# Patient Record
Sex: Female | Born: 1950 | Race: White | Hispanic: No | Marital: Married | Smoking: Former smoker
Health system: Southern US, Community
[De-identification: ages and names within clinical notes are randomized; demographics above are authoritative.]

## PROBLEM LIST (undated history)

## (undated) DIAGNOSIS — M810 Age-related osteoporosis without current pathological fracture: Secondary | ICD-10-CM

## (undated) DIAGNOSIS — G8929 Other chronic pain: Secondary | ICD-10-CM

## (undated) DIAGNOSIS — M1990 Unspecified osteoarthritis, unspecified site: Secondary | ICD-10-CM

## (undated) DIAGNOSIS — E039 Hypothyroidism, unspecified: Secondary | ICD-10-CM

## (undated) DIAGNOSIS — J439 Emphysema, unspecified: Secondary | ICD-10-CM

## (undated) DIAGNOSIS — M4800 Spinal stenosis, site unspecified: Secondary | ICD-10-CM

## (undated) DIAGNOSIS — K219 Gastro-esophageal reflux disease without esophagitis: Secondary | ICD-10-CM

## (undated) DIAGNOSIS — I700 Atherosclerosis of aorta: Secondary | ICD-10-CM

## (undated) DIAGNOSIS — K635 Polyp of colon: Secondary | ICD-10-CM

## (undated) DIAGNOSIS — I38 Endocarditis, valve unspecified: Secondary | ICD-10-CM

## (undated) DIAGNOSIS — E559 Vitamin D deficiency, unspecified: Secondary | ICD-10-CM

## (undated) DIAGNOSIS — I881 Chronic lymphadenitis, except mesenteric: Secondary | ICD-10-CM

## (undated) DIAGNOSIS — I6529 Occlusion and stenosis of unspecified carotid artery: Secondary | ICD-10-CM

## (undated) DIAGNOSIS — M961 Postlaminectomy syndrome, not elsewhere classified: Secondary | ICD-10-CM

## (undated) DIAGNOSIS — I10 Essential (primary) hypertension: Secondary | ICD-10-CM

## (undated) DIAGNOSIS — C4490 Unspecified malignant neoplasm of skin, unspecified: Secondary | ICD-10-CM

## (undated) DIAGNOSIS — I2510 Atherosclerotic heart disease of native coronary artery without angina pectoris: Secondary | ICD-10-CM

## (undated) DIAGNOSIS — E785 Hyperlipidemia, unspecified: Secondary | ICD-10-CM

## (undated) DIAGNOSIS — Z860101 Personal history of adenomatous and serrated colon polyps: Secondary | ICD-10-CM

## (undated) DIAGNOSIS — I77810 Thoracic aortic ectasia: Secondary | ICD-10-CM

## (undated) DIAGNOSIS — J302 Other seasonal allergic rhinitis: Secondary | ICD-10-CM

## (undated) DIAGNOSIS — R7303 Prediabetes: Secondary | ICD-10-CM

## (undated) DIAGNOSIS — E079 Disorder of thyroid, unspecified: Secondary | ICD-10-CM

## (undated) DIAGNOSIS — K760 Fatty (change of) liver, not elsewhere classified: Secondary | ICD-10-CM

## (undated) DIAGNOSIS — G4730 Sleep apnea, unspecified: Secondary | ICD-10-CM

## (undated) DIAGNOSIS — J449 Chronic obstructive pulmonary disease, unspecified: Secondary | ICD-10-CM

## (undated) HISTORY — PX: ABDOMINAL HYSTERECTOMY: SHX81

## (undated) HISTORY — PX: UPPER GI ENDOSCOPY: SHX6162

## (undated) HISTORY — PX: EYE SURGERY: SHX253

## (undated) HISTORY — PX: TONSILLECTOMY: SUR1361

## (undated) HISTORY — PX: COLONOSCOPY: SHX174

## (undated) HISTORY — PX: BACK SURGERY: SHX140

---
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
## 1972-12-05 HISTORY — PX: TUBAL LIGATION: SHX77

## 1972-12-05 HISTORY — PX: APPENDECTOMY: SHX54

## 1983-12-06 HISTORY — PX: BREAST EXCISIONAL BIOPSY: SUR124

## 1991-12-06 HISTORY — PX: ABDOMINAL HYSTERECTOMY: SHX81

## 2004-06-20 ENCOUNTER — Other Ambulatory Visit: Payer: Self-pay

## 2004-10-13 ENCOUNTER — Ambulatory Visit: Payer: Self-pay | Admitting: Family Medicine

## 2008-12-05 HISTORY — PX: LUMBAR FUSION: SHX111

## 2011-12-06 HISTORY — PX: LUMBAR FUSION: SHX111

## 2012-04-24 DIAGNOSIS — M961 Postlaminectomy syndrome, not elsewhere classified: Secondary | ICD-10-CM | POA: Insufficient documentation

## 2015-11-18 ENCOUNTER — Other Ambulatory Visit: Payer: Self-pay | Admitting: Orthopedic Surgery

## 2015-11-18 DIAGNOSIS — M79642 Pain in left hand: Secondary | ICD-10-CM

## 2015-11-18 DIAGNOSIS — S638X2D Sprain of other part of left wrist and hand, subsequent encounter: Secondary | ICD-10-CM

## 2015-12-10 ENCOUNTER — Ambulatory Visit
Admission: RE | Admit: 2015-12-10 | Discharge: 2015-12-10 | Disposition: A | Discharge status: Home / Self Care | Payer: BLUE CROSS/BLUE SHIELD | Source: Ambulatory Visit | Attending: Orthopedic Surgery | Admitting: Orthopedic Surgery

## 2015-12-10 DIAGNOSIS — M19042 Primary osteoarthritis, left hand: Secondary | ICD-10-CM | POA: Diagnosis not present

## 2015-12-10 DIAGNOSIS — M79642 Pain in left hand: Secondary | ICD-10-CM | POA: Insufficient documentation

## 2015-12-10 DIAGNOSIS — M659 Synovitis and tenosynovitis, unspecified: Secondary | ICD-10-CM | POA: Insufficient documentation

## 2015-12-10 DIAGNOSIS — S638X2D Sprain of other part of left wrist and hand, subsequent encounter: Secondary | ICD-10-CM

## 2016-06-17 DIAGNOSIS — C4490 Unspecified malignant neoplasm of skin, unspecified: Secondary | ICD-10-CM | POA: Insufficient documentation

## 2018-03-07 ENCOUNTER — Other Ambulatory Visit: Payer: Self-pay | Admitting: Family Medicine

## 2018-03-07 DIAGNOSIS — Z1231 Encounter for screening mammogram for malignant neoplasm of breast: Secondary | ICD-10-CM

## 2018-03-08 ENCOUNTER — Other Ambulatory Visit: Payer: Self-pay | Admitting: Family Medicine

## 2018-03-08 DIAGNOSIS — M7989 Other specified soft tissue disorders: Secondary | ICD-10-CM

## 2018-03-13 ENCOUNTER — Other Ambulatory Visit: Payer: Self-pay | Admitting: Family Medicine

## 2018-03-13 DIAGNOSIS — M7989 Other specified soft tissue disorders: Secondary | ICD-10-CM

## 2018-03-16 ENCOUNTER — Other Ambulatory Visit: Payer: Self-pay | Admitting: *Deleted

## 2018-03-16 ENCOUNTER — Inpatient Hospital Stay
Admission: RE | Admit: 2018-03-16 | Discharge: 2018-03-16 | Disposition: A | Discharge status: Home / Self Care | Payer: Self-pay | Source: Ambulatory Visit | Attending: *Deleted | Admitting: *Deleted

## 2018-03-16 DIAGNOSIS — Z9289 Personal history of other medical treatment: Secondary | ICD-10-CM

## 2018-03-28 ENCOUNTER — Ambulatory Visit
Admission: RE | Admit: 2018-03-28 | Discharge: 2018-03-28 | Disposition: A | Discharge status: Home / Self Care | Payer: Medicare Other | Source: Ambulatory Visit | Attending: Family Medicine | Admitting: Family Medicine

## 2018-03-28 DIAGNOSIS — R928 Other abnormal and inconclusive findings on diagnostic imaging of breast: Secondary | ICD-10-CM | POA: Insufficient documentation

## 2018-03-28 DIAGNOSIS — M7989 Other specified soft tissue disorders: Secondary | ICD-10-CM

## 2018-04-03 ENCOUNTER — Other Ambulatory Visit: Payer: Self-pay

## 2018-04-03 ENCOUNTER — Emergency Department
Admission: EM | Admit: 2018-04-03 | Discharge: 2018-04-03 | Disposition: A | Discharge status: Home / Self Care | Payer: Medicare Other | Attending: Emergency Medicine | Admitting: Emergency Medicine

## 2018-04-03 ENCOUNTER — Encounter: Payer: Self-pay | Admitting: Emergency Medicine

## 2018-04-03 DIAGNOSIS — S6992XA Unspecified injury of left wrist, hand and finger(s), initial encounter: Secondary | ICD-10-CM | POA: Diagnosis present

## 2018-04-03 DIAGNOSIS — Z79899 Other long term (current) drug therapy: Secondary | ICD-10-CM | POA: Diagnosis not present

## 2018-04-03 DIAGNOSIS — I10 Essential (primary) hypertension: Secondary | ICD-10-CM | POA: Diagnosis not present

## 2018-04-03 DIAGNOSIS — Z91040 Latex allergy status: Secondary | ICD-10-CM | POA: Diagnosis not present

## 2018-04-03 DIAGNOSIS — Y999 Unspecified external cause status: Secondary | ICD-10-CM | POA: Diagnosis not present

## 2018-04-03 DIAGNOSIS — S61012A Laceration without foreign body of left thumb without damage to nail, initial encounter: Secondary | ICD-10-CM | POA: Insufficient documentation

## 2018-04-03 DIAGNOSIS — W260XXA Contact with knife, initial encounter: Secondary | ICD-10-CM | POA: Diagnosis not present

## 2018-04-03 DIAGNOSIS — F17200 Nicotine dependence, unspecified, uncomplicated: Secondary | ICD-10-CM | POA: Diagnosis not present

## 2018-04-03 DIAGNOSIS — Y929 Unspecified place or not applicable: Secondary | ICD-10-CM | POA: Insufficient documentation

## 2018-04-03 DIAGNOSIS — Y93G1 Activity, food preparation and clean up: Secondary | ICD-10-CM | POA: Diagnosis not present

## 2018-04-03 HISTORY — DX: Disorder of thyroid, unspecified: E07.9

## 2018-04-03 HISTORY — DX: Essential (primary) hypertension: I10

## 2018-04-03 HISTORY — DX: Unspecified osteoarthritis, unspecified site: M19.90

## 2018-04-03 MED ORDER — BACITRACIN ZINC 500 UNIT/GM EX OINT
TOPICAL_OINTMENT | Freq: Once | CUTANEOUS | Status: AC
  Administered 2018-04-03: 1 via TOPICAL
  Filled 2018-04-03: qty 0.9

## 2018-04-03 MED ORDER — LIDOCAINE HCL (PF) 1 % IJ SOLN
5.0000 mL | Freq: Once | INTRAMUSCULAR | Status: DC
  Filled 2018-04-03: qty 5

## 2018-04-03 NOTE — ED Triage Notes (Signed)
Pt to ED via POV. Pt states that she was slicing ham and accidentally cut her thumb. Pt is in NAD at this time. Bleeding is currently controlled.

## 2018-04-03 NOTE — ED Notes (Signed)
See triage note   States she was slice meat   The knife slipped   Laceration noted to left thumb area

## 2018-04-03 NOTE — Discharge Instructions (Addendum)
Follow-up with your regular doctor in 7 to 10 days for suture removal.  Return to the emergency department if there is any sign of infection which would include pus, swelling, redness or increased pain.  Keep the area clean and dry.  Wash daily with soap and water.  Return to the emergency department for any issues with sutures.

## 2018-04-03 NOTE — ED Provider Notes (Signed)
[facility] Emergency Department Provider Note  ____________________________________________   First MD Initiated Contact with Patient 04/03/18 1207     (approximate)  I have reviewed the triage vital signs and the nursing notes.   HISTORY  Chief Complaint Laceration    HPI Carol Carey is a 67 y.o. female presents emergency department with a laceration to the left thumb.  She states the night slipped while she was cutting ham.  She states her tetanus is may be 4 to 5 years.  No greater than 5.  Patient denies any other injuries.  Past Medical History:  Diagnosis Date  . Arthritis   . Hypertension   . Thyroid disease     There are no active problems to display for this patient.   Past Surgical History:  Procedure Laterality Date  . ABDOMINAL HYSTERECTOMY    . BREAST EXCISIONAL BIOPSY Right 1985   Benign cycts removed    Prior to Admission medications   Medication Sig Start Date End Date Taking? Authorizing Provider  atenolol (TENORMIN) 50 MG tablet Take 50 mg by mouth daily.   Yes [provider]  buPROPion (WELLBUTRIN SR) 150 MG 12 hr tablet Take 150 mg by mouth 2 (two) times daily.   Yes [provider]  denosumab (PROLIA) 60 MG/ML SOSY injection Inject 60 mg into the skin every 6 (six) months.   Yes [provider]  esomeprazole (NEXIUM) 20 MG capsule Take 20 mg by mouth daily at 12 noon.   Yes [provider]  losartan-hydrochlorothiazide (HYZAAR) 100-25 MG tablet Take 1 tablet by mouth daily.   Yes [provider]  thyroid (ARMOUR) 60 MG tablet Take 60 mg by mouth daily before breakfast.   Yes [provider]    Allergies Demerol [meperidine hcl]; Latex; Penicillins; and Sulfa antibiotics  Family History  Problem Relation Age of Onset  . Breast cancer Neg Hx     Social History Social History   Tobacco Use  . Smoking status: Current Every Day Smoker  . Smokeless tobacco:  Never Used  Substance Use Topics  . Alcohol use: Yes  . Drug use: Not Currently    Review of Systems  Constitutional: No fever/chills Eyes: No visual changes. ENT: No sore throat. Respiratory: Denies cough Genitourinary: Negative for dysuria. Musculoskeletal: Negative for back pain.  Positive laceration to the left thumb Skin: Negative for rash.    ____________________________________________   PHYSICAL EXAM:  VITAL SIGNS: ED Triage Vitals  Enc Vitals Group     BP 04/03/18 1154 (S) (!) 190/87     Pulse Rate 04/03/18 1154 72     Resp 04/03/18 1154 16     Temp 04/03/18 1154 98.5 F (36.9 C)     Temp Source 04/03/18 1154 Oral     SpO2 04/03/18 1154 96 %     Weight 04/03/18 1156 160 lb (72.6 kg)     Height 04/03/18 1156 5' (1.524 m)     Head Circumference --      Peak Flow --      Pain Score 04/03/18 1156 5     Pain Loc --      Pain Edu? --      Excl. in [place]? --     Constitutional: Alert and oriented. Well appearing and in no acute distress. Eyes: Conjunctivae are normal.  Head: Atraumatic. Nose: No congestion/rhinnorhea. Mouth/Throat: Mucous membranes are moist.   Cardiovascular: Normal rate, regular rhythm. Respiratory: Normal respiratory effort.  No  retractions GU: deferred Musculoskeletal: FROM all extremities, warm and well perfused.  Positive for a blister patient to the dorsum of the left thumb.  No foreign body is noted.  The patient has full range of motion Neurologic:  Normal speech and language.  Skin:  Skin is warm, dry , positive laceration to the left thumb Psychiatric: Mood and affect are normal. Speech and behavior are normal.  ____________________________________________   LABS (all labs ordered are listed, but only abnormal results are displayed)  Labs Reviewed - No data to  display ____________________________________________   ____________________________________________  RADIOLOGY    ____________________________________________   PROCEDURES  Procedure(s) performed:   Marland KitchenMarland KitchenLaceration Repair Date/Time: 04/03/2018 12:42 PM Performed by: Versie Starks, PA-C Authorized by: Versie Starks, PA-C   Consent:    Consent obtained:  Verbal   Consent given by:  Patient   Risks discussed:  Infection, pain, poor cosmetic result, poor wound healing and tendon damage   Alternatives discussed:  No treatment Anesthesia (see MAR for exact dosages):    Anesthesia method:  Nerve block   Block location:  Left thumb   Block needle gauge:  25 G   Block anesthetic:  Lidocaine 1% w/o epi   Block injection procedure:  Anatomic landmarks identified, introduced needle, incremental injection, anatomic landmarks palpated and negative aspiration for blood   Block outcome:  Anesthesia achieved Laceration details:    Location:  Finger   Finger location:  L thumb   Length (cm):  1.5   Depth (mm):  2 Repair type:    Repair type:  Simple Pre-procedure details:    Preparation:  Patient was prepped and draped in usual sterile fashion Exploration:    Hemostasis achieved with:  Direct pressure   Wound exploration: wound explored through full range of motion     Contaminated: no   Treatment:    Area cleansed with:  Betadine and saline   Amount of cleaning:  Standard   Irrigation solution:  Sterile saline   Irrigation method:  Syringe and tap   Visualized foreign bodies/material removed: no   Skin repair:    Repair method:  Sutures   Suture size:  5-0   Suture material:  Nylon   Suture technique:  Simple interrupted   Number of sutures:  5 Approximation:    Approximation:  Close Post-procedure details:    Dressing:  Antibiotic ointment and non-adherent dressing   Patient tolerance of procedure:  Tolerated well, no immediate complications Comments:     Sutures done  by [facility] student with my supervision      ____________________________________________   INITIAL IMPRESSION / ASSESSMENT AND PLAN / ED COURSE  Pertinent labs & imaging results that were available during my care of the patient were reviewed by me and considered in my medical decision making (see chart for details).  Patient is a [age]-year-old female presents emergency department with laceration to the left thumb.  On physical exam the left thumb has a 1 and half centimeter laceration dorsally.  The laceration was repaired with 5-0 Ethilon.  5 simple sutures were inserted by the student.  The patient tolerated procedure well.  Sutures are well aligned and intact.  The patient was instructed to follow-up with her regular doctor in 7 to 10 days for suture removal.  She is to clean the area with soap and water daily.  She is to return the emergency department for any signs of infection.  She states she understands and was discharged in  stable condition.     As part of my medical decision making, I reviewed the following data within the [place] notes reviewed and incorporated, Notes from prior ED visits and [place] Controlled Substance Database  ____________________________________________   FINAL CLINICAL IMPRESSION(S) / ED DIAGNOSES  Final diagnoses:  Laceration of left thumb without foreign body without damage to nail, initial encounter      NEW MEDICATIONS STARTED DURING THIS VISIT:  Discharge Medication List as of 04/03/2018  1:08 PM       Note:  This document was prepared using Dragon voice recognition software and may include unintentional dictation errors.    Versie Starks, PA-C 04/03/18 [place], [place], MD 04/04/18 0900

## 2018-04-03 NOTE — ED Notes (Signed)
First Nurse Note: Pt was slicing meat and knife slipped cutting her left thumb. Bleeding is under control at this time. States that tetanus has been done 4 years ago.

## 2018-04-11 ENCOUNTER — Telehealth: Payer: Self-pay | Admitting: *Deleted

## 2018-04-11 DIAGNOSIS — Z122 Encounter for screening for malignant neoplasm of respiratory organs: Secondary | ICD-10-CM

## 2018-04-11 DIAGNOSIS — Z87891 Personal history of nicotine dependence: Secondary | ICD-10-CM

## 2018-04-11 NOTE — Telephone Encounter (Signed)
Received referral for initial lung cancer screening scan. Contacted patient and obtained smoking history,(current, 47 pack year) as well as answering questions related to screening process. Patient denies signs of lung cancer such as weight loss or hemoptysis. Patient denies comorbidity that would prevent curative treatment if lung cancer were found. Patient is scheduled for shared decision making visit and CT scan on 04/24/18.

## 2018-04-24 ENCOUNTER — Ambulatory Visit
Admission: RE | Admit: 2018-04-24 | Discharge: 2018-04-24 | Disposition: A | Discharge status: Home / Self Care | Payer: Medicare Other | Source: Ambulatory Visit | Attending: Oncology | Admitting: Oncology

## 2018-04-24 ENCOUNTER — Encounter: Payer: Self-pay | Admitting: Oncology

## 2018-04-24 ENCOUNTER — Inpatient Hospital Stay: Payer: Medicare Other | Attending: Oncology | Admitting: Oncology

## 2018-04-24 DIAGNOSIS — Z87891 Personal history of nicotine dependence: Secondary | ICD-10-CM

## 2018-04-24 DIAGNOSIS — I700 Atherosclerosis of aorta: Secondary | ICD-10-CM | POA: Insufficient documentation

## 2018-04-24 DIAGNOSIS — J439 Emphysema, unspecified: Secondary | ICD-10-CM | POA: Diagnosis not present

## 2018-04-24 DIAGNOSIS — F17210 Nicotine dependence, cigarettes, uncomplicated: Secondary | ICD-10-CM | POA: Diagnosis not present

## 2018-04-24 DIAGNOSIS — Z122 Encounter for screening for malignant neoplasm of respiratory organs: Secondary | ICD-10-CM | POA: Diagnosis not present

## 2018-04-24 DIAGNOSIS — I2510 Atherosclerotic heart disease of native coronary artery without angina pectoris: Secondary | ICD-10-CM | POA: Diagnosis not present

## 2018-04-24 NOTE — Progress Notes (Signed)
In accordance with CMS guidelines, patient has met eligibility criteria including age, absence of signs or symptoms of lung cancer.  Social History   Tobacco Use  . Smoking status: Current Every Day Smoker    Packs/day: 1.00    Years: 47.00    Pack years: 47.00  . Smokeless tobacco: Never Used  Substance Use Topics  . Alcohol use: Yes  . Drug use: Not Currently     A shared decision-making session was conducted prior to the performance of CT scan. This includes one or more decision aids, includes benefits and harms of screening, follow-up diagnostic testing, over-diagnosis, false positive rate, and total radiation exposure.  Counseling on the importance of adherence to annual lung cancer LDCT screening, impact of co-morbidities, and ability or willingness to undergo diagnosis and treatment is imperative for compliance of the program.  Counseling on the importance of continued smoking cessation for former smokers; the importance of smoking cessation for current smokers, and information about tobacco cessation interventions have been given to patient including [place] and 1800 quit [place] programs.  Written order for lung cancer screening with LDCT has been given to the patient and any and all questions have been answered to the best of my abilities.   Yearly follow up will be coordinated by Burgess Estelle, Thoracic Navigator.  Faythe Casa, NP 04/24/2018 1:15 PM

## 2018-04-27 ENCOUNTER — Telehealth: Payer: Self-pay | Admitting: *Deleted

## 2018-04-27 NOTE — Telephone Encounter (Signed)
Notified patient of LDCT lung cancer screening program results with recommendation for 12 month follow up imaging. Also notified of incidental findings noted below and is encouraged to discuss further with PCP who will receive a copy of this note and/or the CT report. Patient verbalizes understanding.   IMPRESSION: 1. Lung-RADS 1, negative. Continue annual screening with low-dose chest CT without contrast in 12 months. 2. Aortic atherosclerosis (ICD10-I70.0), coronary artery atherosclerosis and emphysema (ICD10-J43.9). 3. Borderline enlarged AP window node is likely reactive. Recommend attention on follow-up.

## 2018-05-02 DIAGNOSIS — Z86010 Personal history of colonic polyps: Secondary | ICD-10-CM | POA: Insufficient documentation

## 2018-05-04 DIAGNOSIS — I708 Atherosclerosis of other arteries: Secondary | ICD-10-CM | POA: Insufficient documentation

## 2018-05-04 DIAGNOSIS — I7090 Unspecified atherosclerosis: Secondary | ICD-10-CM | POA: Insufficient documentation

## 2018-05-04 DIAGNOSIS — J439 Emphysema, unspecified: Secondary | ICD-10-CM | POA: Insufficient documentation

## 2019-04-23 ENCOUNTER — Other Ambulatory Visit: Payer: Self-pay | Admitting: Family Medicine

## 2019-04-23 DIAGNOSIS — R590 Localized enlarged lymph nodes: Secondary | ICD-10-CM

## 2019-05-01 ENCOUNTER — Other Ambulatory Visit: Payer: Self-pay

## 2019-05-01 ENCOUNTER — Ambulatory Visit
Admission: RE | Admit: 2019-05-01 | Discharge: 2019-05-01 | Disposition: A | Discharge status: Home / Self Care | Payer: Medicare Other | Source: Ambulatory Visit | Attending: Family Medicine | Admitting: Family Medicine

## 2019-05-01 DIAGNOSIS — R590 Localized enlarged lymph nodes: Secondary | ICD-10-CM | POA: Diagnosis not present

## 2019-05-09 ENCOUNTER — Other Ambulatory Visit (HOSPITAL_COMMUNITY): Payer: Self-pay | Admitting: Otolaryngology

## 2019-05-09 ENCOUNTER — Other Ambulatory Visit: Payer: Self-pay | Admitting: Otolaryngology

## 2019-05-09 DIAGNOSIS — I881 Chronic lymphadenitis, except mesenteric: Secondary | ICD-10-CM

## 2019-05-15 ENCOUNTER — Other Ambulatory Visit
Admission: RE | Admit: 2019-05-15 | Discharge: 2019-05-15 | Disposition: A | Discharge status: Home / Self Care | Payer: Medicare Other | Source: Ambulatory Visit | Attending: Otolaryngology | Admitting: Otolaryngology

## 2019-05-15 ENCOUNTER — Ambulatory Visit
Admission: RE | Admit: 2019-05-15 | Discharge: 2019-05-15 | Disposition: A | Discharge status: Home / Self Care | Payer: Medicare Other | Source: Ambulatory Visit | Attending: Otolaryngology | Admitting: Otolaryngology

## 2019-05-15 ENCOUNTER — Other Ambulatory Visit: Payer: Self-pay

## 2019-05-15 DIAGNOSIS — I881 Chronic lymphadenitis, except mesenteric: Secondary | ICD-10-CM | POA: Insufficient documentation

## 2019-05-15 LAB — CREATININE, SERUM
Creatinine, Ser: 0.96 mg/dL (ref 0.44–1.00)
GFR calc Af Amer: >60 mL/min (ref >60)
GFR calc non Af Amer: >60 mL/min (ref >60)

## 2019-05-15 MED ORDER — IOHEXOL 300 MG/ML  SOLN
75.0000 mL | Freq: Once | INTRAMUSCULAR | Status: AC | PRN
  Administered 2019-05-15: 75 mL via INTRAVENOUS

## 2019-05-21 ENCOUNTER — Telehealth: Payer: Self-pay | Admitting: *Deleted

## 2019-05-21 DIAGNOSIS — Z87891 Personal history of nicotine dependence: Secondary | ICD-10-CM

## 2019-05-21 DIAGNOSIS — Z122 Encounter for screening for malignant neoplasm of respiratory organs: Secondary | ICD-10-CM

## 2019-05-21 NOTE — Telephone Encounter (Signed)
Patient has been notified that annual lung cancer screening low dose CT scan is due currently or will be in near future. Confirmed that patient is within the age range of 55-77, and asymptomatic, (no signs or symptoms of lung cancer). Patient denies illness that would prevent curative treatment for lung cancer if found. Verified smoking history, (current, 47.5 pack year). The shared decision making visit was done 04/24/18. Patient is agreeable for CT scan being scheduled.

## 2019-05-28 ENCOUNTER — Other Ambulatory Visit: Payer: Self-pay

## 2019-05-28 ENCOUNTER — Ambulatory Visit
Admission: RE | Admit: 2019-05-28 | Discharge: 2019-05-28 | Disposition: A | Discharge status: Home / Self Care | Payer: Medicare Other | Source: Ambulatory Visit | Attending: Oncology | Admitting: Oncology

## 2019-05-28 DIAGNOSIS — Z122 Encounter for screening for malignant neoplasm of respiratory organs: Secondary | ICD-10-CM | POA: Diagnosis not present

## 2019-05-28 DIAGNOSIS — Z87891 Personal history of nicotine dependence: Secondary | ICD-10-CM | POA: Diagnosis present

## 2019-05-29 ENCOUNTER — Telehealth: Payer: Self-pay | Admitting: *Deleted

## 2019-05-29 NOTE — Telephone Encounter (Signed)
Notified patient of LDCT lung cancer screening program results with recommendation for 6 month follow up imaging. Also notified of incidental findings noted below and is encouraged to discuss further with PCP who will receive a copy of this note and/or the CT report. Patient verbalizes understanding.   IMPRESSION: 1. New 4.6 mm lingular nodule, likely atelectasis. Lung-RADS 3, probably benign findings. Short-term follow-up in 6 months is recommended with repeat low-dose chest CT without contrast (please use the following order, "CT CHEST LCS NODULE FOLLOW-UP W/O CM"). 2.  Aortic Atherosclerois (ICD10-170.0) 3. Ascending thoracic aorta measures 4.1 cm diameter. Recommend annual imaging followup by CTA or MRA. This recommendation follows 2010 ACCF/AHA/AATS/ACR/ASA/SCA/SCAI/SIR/STS/SVM Guidelines for the Diagnosis and Management of Patients with Thoracic Aortic Disease. Circulation. 2010; 121: D744-Z146. Aortic aneurysm NOS (ICD10-I71.9)

## 2019-06-14 DIAGNOSIS — I881 Chronic lymphadenitis, except mesenteric: Secondary | ICD-10-CM | POA: Insufficient documentation

## 2019-06-17 ENCOUNTER — Other Ambulatory Visit: Payer: Self-pay

## 2019-06-17 ENCOUNTER — Ambulatory Visit
Admission: RE | Admit: 2019-06-17 | Discharge: 2019-06-17 | Disposition: A | Discharge status: Home / Self Care | Payer: Medicare Other | Source: Ambulatory Visit | Attending: Otolaryngology | Admitting: Otolaryngology

## 2019-06-17 ENCOUNTER — Other Ambulatory Visit (HOSPITAL_COMMUNITY): Payer: Self-pay | Admitting: Otolaryngology

## 2019-06-17 ENCOUNTER — Other Ambulatory Visit: Payer: Self-pay | Admitting: Otolaryngology

## 2019-06-17 DIAGNOSIS — M272 Inflammatory conditions of jaws: Secondary | ICD-10-CM | POA: Diagnosis not present

## 2019-06-17 MED ORDER — IOHEXOL 300 MG/ML  SOLN
75.0000 mL | Freq: Once | INTRAMUSCULAR | Status: AC | PRN
  Administered 2019-06-17: 14:00:00 75 mL via INTRAVENOUS

## 2019-06-28 ENCOUNTER — Other Ambulatory Visit: Payer: Self-pay | Admitting: Internal Medicine

## 2019-06-28 DIAGNOSIS — Z1231 Encounter for screening mammogram for malignant neoplasm of breast: Secondary | ICD-10-CM

## 2019-07-02 ENCOUNTER — Ambulatory Visit (INDEPENDENT_AMBULATORY_CARE_PROVIDER_SITE_OTHER): Payer: Medicare Other | Admitting: Vascular Surgery

## 2019-07-02 ENCOUNTER — Other Ambulatory Visit: Payer: Self-pay

## 2019-07-02 ENCOUNTER — Encounter (INDEPENDENT_AMBULATORY_CARE_PROVIDER_SITE_OTHER): Payer: Self-pay | Admitting: Vascular Surgery

## 2019-07-02 DIAGNOSIS — I10 Essential (primary) hypertension: Secondary | ICD-10-CM

## 2019-07-02 DIAGNOSIS — Z79899 Other long term (current) drug therapy: Secondary | ICD-10-CM | POA: Diagnosis not present

## 2019-07-02 DIAGNOSIS — F17200 Nicotine dependence, unspecified, uncomplicated: Secondary | ICD-10-CM

## 2019-07-02 DIAGNOSIS — I6523 Occlusion and stenosis of bilateral carotid arteries: Secondary | ICD-10-CM | POA: Diagnosis not present

## 2019-07-02 DIAGNOSIS — I6529 Occlusion and stenosis of unspecified carotid artery: Secondary | ICD-10-CM | POA: Insufficient documentation

## 2019-07-02 NOTE — Assessment & Plan Note (Signed)
blood pressure control important in reducing the progression of atherosclerotic disease. On appropriate oral medications.  

## 2019-07-02 NOTE — Progress Notes (Signed)
Patient ID: Carol Carey, female   DOB: Feb 28, 1951, 68 y.o.   MRN: [id number]  Chief Complaint  Patient presents with  . New Patient (Initial Visit)    HPI Carol Carey is a 68 y.o. female.  I am asked to see the patient by Dr. Kathyrn Sheriff for evaluation of carotid disease.  The patient reports lots of issues with dental abscess involving her sinuses.  This has been a prolonged course and she has been symptomatic from this for many months.  She has not had any focal neurologic symptoms. Specifically, the patient denies amaurosis fugax, speech or swallowing difficulties, or arm or leg weakness or numbness.  She underwent a CT scan of the neck which I have independently reviewed.  This was interpreted by the radiologist in a very bland fashion as possible significant atherosclerotic disease bilaterally with possible stenosis in the left carotid artery.  Although the scan was not really done with a CTA protocol, one can be a little more specific than that and there is definitely some significant stenosis present on both sides.  This was a relatively thin cut study and there would appear to my interpretation to be a 60 to 65% stenosis at least of the left carotid artery and probably closer to a 40 to 50% stenosis on the right.  Her atherosclerotic risk factors include hypertension and ongoing tobacco use.  She understands the need for smoking cessation and has begun trying to quit.   Past Medical History:  Diagnosis Date  . Arthritis   . Hypertension   . Thyroid disease     Past Surgical History:  Procedure Laterality Date  . ABDOMINAL HYSTERECTOMY    . BREAST EXCISIONAL BIOPSY Right 1985   Benign cycts removed    Family History  Problem Relation Age of Onset  . Breast cancer Neg Hx   No bleeding disorders, no clotting disorders, no aneurysms  Social History Social History   Tobacco Use  . Smoking status: Current Every Day Smoker    Packs/day: 1.00    Years: 47.00    Pack  years: 47.00  . Smokeless tobacco: Never Used  Substance Use Topics  . Alcohol use: Yes  . Drug use: Not Currently     Allergies  Allergen Reactions  . Demerol [Meperidine Hcl]   . Latex   . Penicillins   . Sulfa Antibiotics Rash    Current Outpatient Medications  Medication Sig Dispense Refill  . amLODipine (NORVASC) 10 MG tablet     . atenolol (TENORMIN) 50 MG tablet Take 50 mg by mouth daily.    Marland Kitchen atorvastatin (LIPITOR) 20 MG tablet     . buPROPion (WELLBUTRIN SR) 150 MG 12 hr tablet Take 150 mg by mouth 2 (two) times daily.    Marland Kitchen denosumab (PROLIA) 60 MG/ML SOSY injection Inject 60 mg into the skin every 6 (six) months.    . esomeprazole (NEXIUM) 20 MG capsule Take 20 mg by mouth daily at 12 noon.    . hydrochlorothiazide (HYDRODIURIL) 25 MG tablet     . levothyroxine (SYNTHROID) 112 MCG tablet     . losartan (COZAAR) 100 MG tablet     . montelukast (SINGULAIR) 10 MG tablet     . atenolol (TENORMIN) 50 MG tablet     . esomeprazole (NEXIUM) 40 MG capsule     . losartan-hydrochlorothiazide (HYZAAR) 100-25 MG tablet Take 1 tablet by mouth daily.    Marland Kitchen thyroid (ARMOUR) 60 MG tablet Take 60  mg by mouth daily before breakfast.     No current facility-administered medications for this visit.       REVIEW OF SYSTEMS (Negative unless checked)  Constitutional: [] Weight loss  [] Fever  [] Chills Cardiac: [] Chest pain   [] Chest pressure   [] Palpitations   [] Shortness of breath when laying flat   [] Shortness of breath at rest   [] Shortness of breath with exertion. Vascular:  [] Pain in legs with walking   [] Pain in legs at rest   [] Pain in legs when laying flat   [] Claudication   [] Pain in feet when walking  [] Pain in feet at rest  [] Pain in feet when laying flat   [] History of DVT   [] Phlebitis   [] Swelling in legs   [] Varicose veins   [] Non-healing ulcers Pulmonary:   [] Uses home oxygen   [] Productive cough   [] Hemoptysis   [] Wheeze  [] COPD   [] Asthma Neurologic:  [] Dizziness   [] Blackouts   [] Seizures   [] History of stroke   [] History of TIA  [] Aphasia   [] Temporary blindness   [] Dysphagia   [] Weakness or numbness in arms   [] Weakness or numbness in legs Musculoskeletal:  [x] Arthritis   [] Joint swelling   [] Joint pain   [] Low back pain Hematologic:  [] Easy bruising  [] Easy bleeding   [] Hypercoagulable state   [] Anemic  [] Hepatitis Gastrointestinal:  [] Blood in stool   [] Vomiting blood  [x] Gastroesophageal reflux/heartburn   [] Abdominal pain Genitourinary:  [] Chronic kidney disease   [] Difficult urination  [] Frequent urination  [] Burning with urination   [] Hematuria Skin:  [] Rashes   [] Ulcers   [] Wounds Psychological:  [] History of anxiety   []  History of major depression.    Physical Exam BP 116/75 (BP Location: Left Arm, Patient Position: Sitting, Cuff Size: Large)   Pulse 76   Resp 14   Ht 5' (1.524 m)   Wt 162 lb (73.5 kg)   BMI 31.64 kg/m  Gen:  WD/WN, NAD Head: [place]/AT, No temporalis wasting.  Ear/Nose/Throat: Hearing grossly intact, nares w/o erythema or drainage, oropharynx w/o Erythema/Exudate Eyes: Conjunctiva clear, sclera non-icteric  Neck: trachea midline. Soft left carotid bruit Pulmonary:  Good air movement, clear to auscultation bilaterally.  Cardiac: RRR, normal S1, S2, no Murmurs, rubs or gallops. Vascular:  Vessel Right Left  Radial Palpable Palpable                                   Gastrointestinal: soft, non-tender/non-distended.  Musculoskeletal: M/S 5/5 throughout.  Extremities without ischemic changes.  No deformity or atrophy. No edema. Neurologic: Sensation grossly intact in extremities.  Symmetrical.  Speech is fluent. Motor exam as listed above. Psychiatric: Judgment intact, Mood & affect appropriate for pt's clinical situation. Dermatologic: No rashes or ulcers noted.  No cellulitis or open wounds.  Radiology Ct Soft Tissue Neck W Contrast  Result Date: 06/17/2019 CLINICAL DATA:  Left dental pain. Left facial  swelling. Left lower tooth extraction October 2019. Rule out abscess. EXAM: CT NECK WITH CONTRAST TECHNIQUE: Multidetector CT imaging of the neck was performed using the standard protocol following the bolus administration of intravenous contrast. CONTRAST:  55mL OMNIPAQUE IOHEXOL 300 MG/ML  SOLN COMPARISON:  CT neck 05/15/2019 FINDINGS: Pharynx and larynx: Normal. No mass or swelling. Salivary glands: No inflammation, mass, or stone. Thyroid: Negative Lymph nodes: No enlarged or necrotic lymph nodes Vascular: Atherosclerotic calcification of the carotid bifurcation bilaterally. Possibly significant stenosis on the left. Normal venous  enhancement. Limited intracranial: Negative Visualized orbits: Negative Mastoids and visualized paranasal sinuses: Clear Skeleton: No evidence of acute dental abscess. 3 mm anterolisthesis C4-5 unchanged. Multilevel disc and facet degeneration. Upper chest: Lung apices clear bilaterally. Other: Left facial soft tissue swelling lateral to the mandible. Thickening of the left platysmas muscle. This has developed since the prior study and is most compatible with cellulitis. No soft tissue abscess. IMPRESSION: 1. Progression of cellulitis in the left jaw region. No soft tissue abscess. No evidence of dental abscess. 2. Cervical spondylosis 3. Atherosclerotic disease in the carotid artery bilaterally with possible significant stenosis on the left. Recommend carotid Doppler. Electronically Signed   By: Franchot Gallo M.D.   On: 06/17/2019 14:53    Labs Recent Results (from the past 2160 hour(s))  Creatinine, serum     Status: None   Collection Time: 05/15/19  9:35 AM  Result Value Ref Range   Creatinine, Ser 0.96 0.44 - 1.00 mg/dL   GFR calc non Af Amer >60 >60 mL/min   GFR calc Af Amer >60 >60 mL/min    Comment: Performed at [facility], [address], [place], [place] [address]    Assessment/Plan:  Essential hypertension blood pressure control important in  reducing the progression of atherosclerotic disease. On appropriate oral medications.   Tobacco use disorder We had a discussion for approximately 3-4 minutes regarding the absolute need for smoking cessation due to the deleterious nature of tobacco on the vascular system. We discussed the tobacco use would diminish patency of any intervention, and likely significantly worsen progressio of disease. We discussed multiple agents for quitting including replacement therapy or medications to reduce cravings such as Chantix. The patient voices their understanding of the importance of smoking cessation.   Carotid stenosis I have independently reviewed her CT scan.  This was interpreted by the radiologist in a very bland fashion as possible significant atherosclerotic disease bilaterally with possible stenosis in the left carotid artery.  Although the scan was not really done with a CTA protocol, one can be a little more specific than that and there is definitely some significant stenosis present on both sides.  This was a relatively thin cut study and there would appear to my interpretation to be a 60 to 65% stenosis at least of the left carotid artery and probably closer to a 40 to 50% stenosis on the right.  I would recommend a formal carotid duplex to get velocity and flow characteristics in the near future at her convenience.  I would recommend she begin taking aspirin daily and continue her statin agent.  I have discussed the pathophysiology and natural history of carotid disease with the patient.  I discussed the reasons for treatment are largely reduction of stroke risk.  We will obtain a carotid duplex      Leotis Pain 07/02/2019, 4:24 PM   This note was created with Dragon medical transcription system.  Any errors from dictation are unintentional.

## 2019-07-02 NOTE — Assessment & Plan Note (Signed)
We had a discussion for approximately 3-4 minutes regarding the absolute need for smoking cessation due to the deleterious nature of tobacco on the vascular system. We discussed the tobacco use would diminish patency of any intervention, and likely significantly worsen progressio of disease. We discussed multiple agents for quitting including replacement therapy or medications to reduce cravings such as Chantix. The patient voices their understanding of the importance of smoking cessation.  

## 2019-07-02 NOTE — Assessment & Plan Note (Signed)
I have independently reviewed her CT scan.  This was interpreted by the radiologist in a very bland fashion as possible significant atherosclerotic disease bilaterally with possible stenosis in the left carotid artery.  Although the scan was not really done with a CTA protocol, one can be a little more specific than that and there is definitely some significant stenosis present on both sides.  This was a relatively thin cut study and there would appear to my interpretation to be a 60 to 65% stenosis at least of the left carotid artery and probably closer to a 40 to 50% stenosis on the right.  I would recommend a formal carotid duplex to get velocity and flow characteristics in the near future at her convenience.  I would recommend she begin taking aspirin daily and continue her statin agent.  I have discussed the pathophysiology and natural history of carotid disease with the patient.  I discussed the reasons for treatment are largely reduction of stroke risk.  We will obtain a carotid duplex

## 2019-07-02 NOTE — Patient Instructions (Signed)
Carotid Artery Disease  The carotid arteries are arteries on both sides of the neck. They carry blood to the brain, face, and neck. Carotid artery disease happens when these arteries become smaller (narrow) or get blocked. If these arteries become smaller or get blocked, you are more likely to have a stroke or a warning stroke (transient ischemic attack). Follow these instructions at home:  Take over-the-counter and prescription medicines only as told by your doctor.  Make sure you understand all instructions about your medicines. Do not stop taking your medicines without talking to your doctor first.  Follow your doctor's diet instructions. It is important to follow a healthy diet. ? Eat foods that include plenty of: ? Fresh fruits. ? Vegetables. ? Lean meats. ? Avoid these foods: ? Foods that are high in fat. ? Foods that are high in salt (sodium). ? Foods that are fried. ? Foods that are processed. ? Foods that have few good nutrients (poor nutritional value).  Keep a healthy weight.  Stay active. Get at least 30 minutes of activity every day.  Do not smoke.  Limit alcohol use to: ? No more than 2 drinks a day for men. ? No more than 1 drink a day for women who are not pregnant.  Do not use illegal drugs.  Keep all follow-up visits as told by your doctor. This is important. Contact a doctor if: Get help right away if:  You have any symptoms of stroke or TIA. The acronym BEFAST is an easy way to remember the main warning signs of stroke. ? B = Balance problems. Signs include dizziness, sudden trouble walking, or loss of balance ? E = Eye problems. This includes trouble seeing or a sudden change in vision. ? F = Face changes. This includes sudden weakness or numbness of the face, or the face or eyelid drooping to one side. ? A = Arm weakness or numbness. This happens suddenly and usually on one side of the body. ? S = Speech problems. This includes trouble speaking or  trouble understanding. ? T = Time. Time to call 911 or seek emergency care. Do not wait to see if symptoms go away. Make note of the time your symptoms started.  Other signs of stroke may include: ? A sudden, severe headache with no known cause. ? Feeling sick to your stomach (nauseous) or throwing up (vomiting). ? Seizure. Call your local emergency services (911 in [place] Do notdrive yourself to the clinic or hospital. Summary  The carotid arteries are arteries on both sides of the neck.  If these arteries get smaller or get blocked, you are more likely to have a stroke or a warning stroke (transient ischemic attack).  Take over-the-counter and prescription medicines only as told by your doctor.  Keep all follow-up visits as told by your doctor. This is important. This information is not intended to replace advice given to you by your health care provider. Make sure you discuss any questions you have with your health care provider. Document Released: 11/07/2012 Document Revised: 11/16/2017 Document Reviewed: 11/16/2017 Elsevier Patient Education  2020 [employer] Inc[employer]  

## 2019-07-31 ENCOUNTER — Ambulatory Visit
Admission: RE | Admit: 2019-07-31 | Discharge: 2019-07-31 | Disposition: A | Discharge status: Home / Self Care | Payer: Medicare Other | Source: Ambulatory Visit | Attending: Internal Medicine | Admitting: Internal Medicine

## 2019-07-31 DIAGNOSIS — Z1231 Encounter for screening mammogram for malignant neoplasm of breast: Secondary | ICD-10-CM | POA: Insufficient documentation

## 2019-08-01 ENCOUNTER — Other Ambulatory Visit: Payer: Self-pay | Admitting: Internal Medicine

## 2019-08-01 DIAGNOSIS — N6320 Unspecified lump in the left breast, unspecified quadrant: Secondary | ICD-10-CM

## 2019-08-01 DIAGNOSIS — R928 Other abnormal and inconclusive findings on diagnostic imaging of breast: Secondary | ICD-10-CM

## 2019-08-02 ENCOUNTER — Ambulatory Visit (INDEPENDENT_AMBULATORY_CARE_PROVIDER_SITE_OTHER): Payer: Medicare Other | Admitting: Vascular Surgery

## 2019-08-02 ENCOUNTER — Other Ambulatory Visit: Payer: Self-pay

## 2019-08-02 ENCOUNTER — Ambulatory Visit (INDEPENDENT_AMBULATORY_CARE_PROVIDER_SITE_OTHER): Payer: Medicare Other

## 2019-08-02 ENCOUNTER — Encounter (INDEPENDENT_AMBULATORY_CARE_PROVIDER_SITE_OTHER): Payer: Self-pay | Admitting: Vascular Surgery

## 2019-08-02 VITALS — BP 135/72 | HR 69 | Resp 10 | Ht 60.0 in | Wt 162.0 lb

## 2019-08-02 DIAGNOSIS — E785 Hyperlipidemia, unspecified: Secondary | ICD-10-CM | POA: Diagnosis not present

## 2019-08-02 DIAGNOSIS — I6523 Occlusion and stenosis of bilateral carotid arteries: Secondary | ICD-10-CM

## 2019-08-02 DIAGNOSIS — E039 Hypothyroidism, unspecified: Secondary | ICD-10-CM | POA: Insufficient documentation

## 2019-08-02 DIAGNOSIS — I10 Essential (primary) hypertension: Secondary | ICD-10-CM

## 2019-08-02 DIAGNOSIS — G4730 Sleep apnea, unspecified: Secondary | ICD-10-CM | POA: Insufficient documentation

## 2019-08-02 DIAGNOSIS — M810 Age-related osteoporosis without current pathological fracture: Secondary | ICD-10-CM | POA: Insufficient documentation

## 2019-08-02 DIAGNOSIS — M1990 Unspecified osteoarthritis, unspecified site: Secondary | ICD-10-CM | POA: Insufficient documentation

## 2019-08-02 DIAGNOSIS — K219 Gastro-esophageal reflux disease without esophagitis: Secondary | ICD-10-CM | POA: Insufficient documentation

## 2019-08-02 NOTE — Assessment & Plan Note (Signed)
Her carotid duplex today demonstrates velocities slightly lower than suggested from her CT scan but in the same general range.  Her velocities would appear to be in the upper end of the 40 to 59% range on the left and in the 1 to 39% range on the right.  Continue her aspirin and statin agent.  No role for intervention at this level.  Would recheck a duplex in 6 months given her moderate disease on the left by duplex and CT.  Contact our office with any problems in the interim.

## 2019-08-02 NOTE — Assessment & Plan Note (Signed)
lipid control important in reducing the progression of atherosclerotic disease. Continue statin therapy  

## 2019-08-02 NOTE — Progress Notes (Signed)
MRN : [id number]  Carol Carey is a 68 y.o. (11-03-1951) female who presents with chief complaint of  Chief Complaint  Patient presents with   Follow-up  .  History of Present Illness: Patient returns today in follow up of her carotid disease.  She has not had any new symptoms or problems since her last visit.  She denies any recent focal neurologic symptoms. Specifically, the patient denies amaurosis fugax, speech or swallowing difficulties, or arm or leg weakness or numbness.  He has had baby aspirin to her statin regimen.  Her carotid duplex today demonstrates velocities slightly lower than suggested from her CT scan but in the same general range.  Her velocities would appear to be in the upper end of the 40 to 59% range on the left and in the 1 to 39% range on the right.  Current Outpatient Medications  Medication Sig Dispense Refill   amLODipine (NORVASC) 10 MG tablet      atenolol (TENORMIN) 50 MG tablet Take 50 mg by mouth daily.     atorvastatin (LIPITOR) 20 MG tablet      buPROPion (WELLBUTRIN SR) 150 MG 12 hr tablet Take 150 mg by mouth 2 (two) times daily.     denosumab (PROLIA) 60 MG/ML SOSY injection Inject 60 mg into the skin every 6 (six) months.     esomeprazole (NEXIUM) 40 MG capsule      hydrochlorothiazide (HYDRODIURIL) 25 MG tablet      levothyroxine (SYNTHROID) 112 MCG tablet      losartan (COZAAR) 100 MG tablet      meloxicam (MOBIC) 15 MG tablet      montelukast (SINGULAIR) 10 MG tablet      No current facility-administered medications for this visit.     Past Medical History:  Diagnosis Date   Arthritis    Hypertension    Thyroid disease     Past Surgical History:  Procedure Laterality Date   ABDOMINAL HYSTERECTOMY     BREAST EXCISIONAL BIOPSY Right 1985   Benign cycts removed    Social History Social History   Tobacco Use   Smoking status: Current Every Day Smoker    Packs/day: 1.00    Years: 47.00    Pack years: 47.00    Smokeless tobacco: Never Used  Substance Use Topics   Alcohol use: Yes   Drug use: Not Currently    Family History Family History  Problem Relation Age of Onset   Breast cancer Neg Hx   No bleeding or clotting disorders No aneurysms  Allergies  Allergen Reactions   Demerol [Meperidine Hcl]    Latex    Lisinopril Other (See Comments)   Penicillins    Sulfa Antibiotics Rash     REVIEW OF SYSTEMS (Negative unless checked)  Constitutional: [] Weight loss  [] Fever  [] Chills Cardiac: [] Chest pain   [] Chest pressure   [] Palpitations   [] Shortness of breath when laying flat   [] Shortness of breath at rest   [] Shortness of breath with exertion. Vascular:  [] Pain in legs with walking   [] Pain in legs at rest   [] Pain in legs when laying flat   [] Claudication   [] Pain in feet when walking  [] Pain in feet at rest  [] Pain in feet when laying flat   [] History of DVT   [] Phlebitis   [] Swelling in legs   [] Varicose veins   [] Non-healing ulcers Pulmonary:   [] Uses home oxygen   [] Productive cough   [] Hemoptysis   [] Wheeze  []   COPD   [] Asthma Neurologic:  [] Dizziness  [] Blackouts   [] Seizures   [] History of stroke   [] History of TIA  [] Aphasia   [] Temporary blindness   [] Dysphagia   [] Weakness or numbness in arms   [] Weakness or numbness in legs Musculoskeletal:  [x] Arthritis   [] Joint swelling   [] Joint pain   [] Low back pain Hematologic:  [] Easy bruising  [] Easy bleeding   [] Hypercoagulable state   [] Anemic   Gastrointestinal:  [] Blood in stool   [] Vomiting blood  [x] Gastroesophageal reflux/heartburn   [] Abdominal pain Genitourinary:  [] Chronic kidney disease   [] Difficult urination  [] Frequent urination  [] Burning with urination   [] Hematuria Skin:  [] Rashes   [] Ulcers   [] Wounds Psychological:  [] History of anxiety   []  History of major depression.  Physical Examination  BP 135/72 (BP Location: Left Wrist, Patient Position: Sitting, Cuff Size: Normal)    Pulse 69    Resp 10    Ht  5' (1.524 m)    Wt 162 lb (73.5 kg)    BMI 31.64 kg/m  Gen:  WD/WN, NAD Head: [place]/AT, No temporalis wasting. Ear/Nose/Throat: Hearing grossly intact, nares w/o erythema or drainage Eyes: Conjunctiva clear. Sclera non-icteric Neck: Supple.  Trachea midline Pulmonary:  Good air movement, no use of accessory muscles.  Cardiac: RRR, no JVD Vascular:  Vessel Right Left  Radial Palpable Palpable                   Musculoskeletal: M/S 5/5 throughout.  No deformity or atrophy.  No edema. Neurologic: Sensation grossly intact in extremities.  Symmetrical.  Speech is fluent.  Psychiatric: Judgment intact, Mood & affect appropriate for pt's clinical situation. Dermatologic: No rashes or ulcers noted.  No cellulitis or open wounds.       Labs Recent Results (from the past 2160 hour(s))  Creatinine, serum     Status: None   Collection Time: 05/15/19  9:35 AM  Result Value Ref Range   Creatinine, Ser 0.96 0.44 - 1.00 mg/dL   GFR calc non Af Amer >60 >60 mL/min   GFR calc Af Amer >60 >60 mL/min    Comment: Performed at [facility], [address] [place], [place] [address]    Radiology Mm 3d Screen Breast Bilateral  Result Date: 07/31/2019 CLINICAL DATA:  Screening. EXAM: DIGITAL SCREENING BILATERAL MAMMOGRAM WITH TOMO AND CAD COMPARISON:  Previous exam(s). ACR Breast Density Category b: There are scattered areas of fibroglandular density. FINDINGS: In the left breast, a possible mass warrants further evaluation. In the right breast, no findings suspicious for malignancy. Images were processed with CAD. IMPRESSION: Further evaluation is suggested for possible mass in the left breast. RECOMMENDATION: Diagnostic mammogram and possibly ultrasound of the left breast. (Code:[id number]) The patient will be contacted regarding the findings, and additional imaging will be scheduled. BI-RADS CATEGORY  0: Incomplete. Need additional imaging evaluation and/or prior mammograms for  comparison. Electronically Signed   By: Lillia Mountain M.D.   On: 07/31/2019 15:33   Vas US Carotid  Result Date: 08/02/2019 Carotid Arterial Duplex Study Indications:  Carotid artery disease. Risk Factors: Hyperlipidemia. Performing Technologist: Almira Coaster RVS  Examination Guidelines: A complete evaluation includes B-mode imaging, spectral Doppler, color Doppler, and power Doppler as needed of all accessible portions of each vessel. Bilateral testing is considered an integral part of a complete examination. Limited examinations for reoccurring indications may be performed as noted.  Right Carotid Findings: +----------+-------+-------+--------+---------------------------------+--------+  PSV     EDV     Stenosis Plaque Description                Comments              cm/s    cm/s                                                         +----------+-------+-------+--------+---------------------------------+--------+  CCA Prox   87      17                                                           +----------+-------+-------+--------+---------------------------------+--------+  CCA Mid    89      19                                                           +----------+-------+-------+--------+---------------------------------+--------+  CCA Distal 64      19                                                           +----------+-------+-------+--------+---------------------------------+--------+  ICA Prox   53      14               heterogenous, irregular and                                                      calcific                                    +----------+-------+-------+--------+---------------------------------+--------+  ICA Mid    78      17                                                           +----------+-------+-------+--------+---------------------------------+--------+  ICA Distal 83      25                                                            +----------+-------+-------+--------+---------------------------------+--------+  ECA        71      11                                                           +----------+-------+-------+--------+---------------------------------+--------+ +----------+--------+-------+--------+-------------------+  PSV cm/s EDV cms Describe Arm Pressure (mmHG)  +----------+--------+-------+--------+-------------------+  Subclavian 66       0                                     +----------+--------+-------+--------+-------------------+ +---------+--------+--+--------+--+  Vertebral PSV cm/s 68 EDV cm/s 12  +---------+--------+--+--------+--+  Left Carotid Findings: +----------+-------+-------+--------+---------------------------------+--------+             PSV     EDV     Stenosis Plaque Description                Comments              cm/s    cm/s                                                         +----------+-------+-------+--------+---------------------------------+--------+  CCA Prox   68      17                                                           +----------+-------+-------+--------+---------------------------------+--------+  CCA Mid    79      16               heterogenous, irregular and                                                      calcific                                    +----------+-------+-------+--------+---------------------------------+--------+  CCA Distal 67      17                                                           +----------+-------+-------+--------+---------------------------------+--------+  ICA Prox   106     23               heterogenous, irregular and                                                      calcific                                    +----------+-------+-------+--------+---------------------------------+--------+  ICA Mid    141     26                                                            +----------+-------+-------+--------+---------------------------------+--------+  ICA Distal 73      21                                                           +----------+-------+-------+--------+---------------------------------+--------+  ECA        70      11                                                           +----------+-------+-------+--------+---------------------------------+--------+ +----------+--------+--------+--------+-------------------+             PSV cm/s EDV cm/s Describe Arm Pressure (mmHG)  +----------+--------+--------+--------+-------------------+  Subclavian 143      0                                      +----------+--------+--------+--------+-------------------+ +---------+--------+---+--------+--+  Vertebral PSV cm/s 122 EDV cm/s 17  +---------+--------+---+--------+--+  Summary: Right Carotid: Velocities in the right ICA are consistent with a 1-39% stenosis. Left Carotid: Velocities in the left ICA are consistent with a 40-59% stenosis. Vertebrals:  Bilateral vertebral arteries demonstrate antegrade flow. Subclavians: Normal flow hemodynamics were seen in bilateral subclavian              arteries. *See table(s) above for measurements and observations.  Electronically signed by Leotis Pain MD on 08/02/2019 at 10:23:16 AM.    Final     Assessment/Plan  Essential (primary) hypertension blood pressure control important in reducing the progression of atherosclerotic disease. On appropriate oral medications.   Hyperlipidemia lipid control important in reducing the progression of atherosclerotic disease. Continue statin therapy   Carotid stenosis Her carotid duplex today demonstrates velocities slightly lower than suggested from her CT scan but in the same general range.  Her velocities would appear to be in the upper end of the 40 to 59% range on the left and in the 1 to 39% range on the right.  Continue her aspirin and statin agent.  No role for intervention at this level.   Would recheck a duplex in 6 months given her moderate disease on the left by duplex and CT.  Contact our office with any problems in the interim.    Leotis Pain, MD  08/02/2019 10:55 AM    This note was created with Dragon medical transcription system.  Any errors from dictation are purely unintentional

## 2019-08-02 NOTE — Assessment & Plan Note (Signed)
blood pressure control important in reducing the progression of atherosclerotic disease. On appropriate oral medications.  

## 2019-08-09 ENCOUNTER — Ambulatory Visit
Admission: RE | Admit: 2019-08-09 | Discharge: 2019-08-09 | Disposition: A | Discharge status: Home / Self Care | Payer: Medicare Other | Source: Ambulatory Visit | Attending: Internal Medicine | Admitting: Internal Medicine

## 2019-08-09 DIAGNOSIS — N6320 Unspecified lump in the left breast, unspecified quadrant: Secondary | ICD-10-CM | POA: Insufficient documentation

## 2019-08-09 DIAGNOSIS — R928 Other abnormal and inconclusive findings on diagnostic imaging of breast: Secondary | ICD-10-CM | POA: Diagnosis present

## 2019-08-13 DIAGNOSIS — M810 Age-related osteoporosis without current pathological fracture: Secondary | ICD-10-CM | POA: Insufficient documentation

## 2019-08-13 DIAGNOSIS — E559 Vitamin D deficiency, unspecified: Secondary | ICD-10-CM | POA: Insufficient documentation

## 2019-11-19 ENCOUNTER — Telehealth: Payer: Self-pay | Admitting: *Deleted

## 2019-11-19 NOTE — Telephone Encounter (Signed)
Patient would like for follow up CT scan to be in the afternoon, not in the morning if possible.

## 2019-11-19 NOTE — Telephone Encounter (Signed)
Called patient to see what her scheduling preference is for her 6 month lung cancer screening follow up CT scan. She would prefer to be scheduled in January of 2021, in the morning if possible.

## 2019-11-21 NOTE — Telephone Encounter (Signed)
Patient reports that due to Covid concerns she would like to wait until February to schedule scan.

## 2020-01-02 ENCOUNTER — Telehealth: Payer: Self-pay | Admitting: *Deleted

## 2020-01-02 NOTE — Telephone Encounter (Signed)
Contacted in attempt to schedule LCS nodule follow up imaging. Patient refuses due to Covid concerns. Would like to consider in April.

## 2020-02-11 ENCOUNTER — Ambulatory Visit (INDEPENDENT_AMBULATORY_CARE_PROVIDER_SITE_OTHER): Payer: Medicare Other | Admitting: Vascular Surgery

## 2020-02-11 ENCOUNTER — Ambulatory Visit (INDEPENDENT_AMBULATORY_CARE_PROVIDER_SITE_OTHER): Payer: Medicare Other

## 2020-02-11 ENCOUNTER — Other Ambulatory Visit: Payer: Self-pay

## 2020-02-11 ENCOUNTER — Encounter (INDEPENDENT_AMBULATORY_CARE_PROVIDER_SITE_OTHER): Payer: Self-pay | Admitting: Vascular Surgery

## 2020-02-11 VITALS — BP 120/74 | HR 68 | Resp 16 | Ht 60.0 in | Wt 161.0 lb

## 2020-02-11 DIAGNOSIS — I6523 Occlusion and stenosis of bilateral carotid arteries: Secondary | ICD-10-CM

## 2020-02-11 DIAGNOSIS — I10 Essential (primary) hypertension: Secondary | ICD-10-CM

## 2020-02-11 DIAGNOSIS — E785 Hyperlipidemia, unspecified: Secondary | ICD-10-CM

## 2020-02-11 NOTE — Assessment & Plan Note (Signed)
Carotid duplex today reveals stable 1 to 39% right ICA stenosis and stable 40 to 59% left ICA stenosis.  No role for intervention at this level.  Would continue current medical regimen and plan to recheck in 1 year.

## 2020-02-11 NOTE — Patient Instructions (Signed)
Carotid Artery Disease  Carotid artery disease is the narrowing or blockage of one or both carotid arteries. This condition is also called carotid artery stenosis. The carotid arteries are the two main blood vessels on either side of the neck. They send blood to the brain, other parts of the head, and the neck.  This condition increases your risk for a stroke or a transient ischemic attack (TIA). A TIA is a "mini-stroke" that causes stroke-like symptoms that go away quickly. What are the causes? This condition is mainly caused by a narrowing and hardening of the carotid arteries. The carotid arteries can become narrow or clogged with a buildup of plaque. Plaque includes:  Fat.  Cholesterol.  Calcium.  Other substances. What increases the risk? The following factors may make you more likely to develop this condition:  Having certain medical conditions, such as: ? High cholesterol. ? High blood pressure. ? Diabetes. ? Obesity.  Smoking.  A family history of cardiovascular disease.  Not being active or lack of regular exercise.  Being female. Men have a higher risk of having arteries become narrow and harden earlier in life than women.  Old age. What are the signs or symptoms? This condition may not have any signs or symptoms until a stroke or TIA happens. In some cases, your doctor may be able to hear a whooshing sound. This can suggest a change in blood flow caused by plaque buildup. An eye exam can also help find signs of the condition. How is this treated? This condition may be treated with more than one treatment. Treatment options include:  Lifestyle changes, such as: ? Quitting smoking. ? Getting regular exercise, or getting exercise as told by your doctor. ? Eating a healthy diet. ? Managing stress. ? Keeping a healthy weight.  Medicines to control: ? Blood pressure. ? Cholesterol. ? Blood clotting.  Surgery. You may have: ? A surgery to remove the blockages in  the carotid arteries. ? A procedure in which a small mesh tube (stent) is used to widen the blocked carotid arteries. Follow these instructions at home: Eating and drinking Follow instructions about your diet from your doctor. It is important to follow a healthy diet.  Eat a diet that includes: ? A lot of fresh fruits and vegetables. ? Low-fat (lean) meats.  Avoid these foods: ? Foods that are high in fat. ? Foods that are high in salt (sodium). ? Foods that are fried. ? Foods that are processed. ? Foods that have few good nutrients (poor nutritional value).  Lifestyle   Keep a healthy weight.  Do exercises as told by your doctor to stay active. Each week, you should get one of the following: ? At least 150 minutes of exercise that raises your heart rate and makes you sweat (moderate-intensity exercise). ? At least 75 minutes of exercise that takes a lot of effort.  Do not use any products that contain nicotine or tobacco, such as cigarettes, e-cigarettes, and chewing tobacco. If you need help quitting, ask your doctor.  Do not drink alcohol if: ? Your doctor tells you not to drink. ? You are pregnant, may be pregnant, or are planning to become pregnant.  If you drink alcohol: ? Limit how much you use to:  0-1 drink a day for women.  0-2 drinks a day for men. ? Be aware of how much alcohol is in your drink. In the [place] one drink equals one 12 oz bottle of beer (355 mL), one 5   oz glass of wine (148 mL), or one 1 oz glass of hard liquor (44 mL).  Do not use drugs.  Manage your stress. Ask your doctor for tips on how to do this. General instructions  Take over-the-counter and prescription medicines only as told by your doctor.  Keep all follow-up visits as told by your doctor. This is important. Where to find more information  American Heart Association: www.heart.org Get help right away if:  You have any signs of a stroke. "BE FAST" is an easy way to remember the  main warning signs: ? B - Balance. Signs are dizziness, sudden trouble walking, or loss of balance. ? E - Eyes. Signs are trouble seeing or a change in how you see. ? F - Face. Signs are sudden weakness or loss of feeling of the face, or the face or eyelid drooping on one side. ? A - Arms. Signs are weakness or loss of feeling in an arm. This happens suddenly and usually on one side of the body. ? S - Speech. Signs are sudden trouble speaking, slurred speech, or trouble understanding what people say. ? T - Time. Time to call emergency services. Write down what time symptoms started.  You have other signs of a stroke, such as: ? A sudden, very bad headache with no known cause. ? Feeling like you may vomit (nausea). ? Vomiting. ? A seizure. These symptoms may be an emergency. Do not wait to see if the symptoms will go away. Get medical help right away. Call your local emergency services (911 in the [place] Do not drive yourself to the hospital. Summary  The carotid arteries are blood vessels on both sides of the neck.  If these arteries get smaller or get blocked, you are more likely to have a stroke or a mini-stroke.  This condition can be treated with lifestyle changes, medicines, surgery, or a blend of these treatments.  Get help right away if you have any signs of a stroke. "BE FAST" is an easy way to remember the main warning signs of stroke. This information is not intended to replace advice given to you by your health care provider. Make sure you discuss any questions you have with your health care provider. Document Revised: 06/17/2019 Document Reviewed: 06/17/2019 Elsevier Patient Education  2020 [employer].  

## 2020-02-11 NOTE — Progress Notes (Signed)
MRN : [id number]  Carol Carey is a 69 y.o. (1951-09-05) female who presents with chief complaint of  Chief Complaint  Patient presents with  . Follow-up    ultrasound follow up   .  History of Present Illness: Patient returns in follow-up of her carotid disease.  She is doing well without any focal neurologic symptoms or new issues since her last visit. Specifically, the patient denies amaurosis fugax, speech or swallowing difficulties, or arm or leg weakness or numbness. Carotid duplex today reveals stable 1 to 39% right ICA stenosis and stable 40 to 59% left ICA stenosis.   Current Outpatient Medications  Medication Sig Dispense Refill  . amLODipine (NORVASC) 10 MG tablet     . atenolol (TENORMIN) 50 MG tablet Take 50 mg by mouth daily.    Marland Kitchen atorvastatin (LIPITOR) 20 MG tablet     . buPROPion (WELLBUTRIN SR) 150 MG 12 hr tablet Take 150 mg by mouth 2 (two) times daily.    Marland Kitchen denosumab (PROLIA) 60 MG/ML SOSY injection Inject 60 mg into the skin every 6 (six) months.    . esomeprazole (NEXIUM) 40 MG capsule     . hydrochlorothiazide (HYDRODIURIL) 25 MG tablet     . levothyroxine (SYNTHROID) 112 MCG tablet     . losartan (COZAAR) 100 MG tablet     . meloxicam (MOBIC) 15 MG tablet     . montelukast (SINGULAIR) 10 MG tablet      No current facility-administered medications for this visit.    Past Medical History:  Diagnosis Date  . Arthritis   . Hypertension   . Thyroid disease     Past Surgical History:  Procedure Laterality Date  . ABDOMINAL HYSTERECTOMY    . BREAST EXCISIONAL BIOPSY Right 1985   Benign cycts removed     Social History   Tobacco Use  . Smoking status: Current Every Day Smoker    Packs/day: 1.00    Years: 47.00    Pack years: 47.00  . Smokeless tobacco: Never Used  Substance Use Topics  . Alcohol use: Yes  . Drug use: Not Currently     Family History  Problem Relation Age of Onset  . Breast cancer Neg Hx      Allergies  Allergen  Reactions  . Demerol [Meperidine Hcl]   . Latex   . Lisinopril Other (See Comments)  . Penicillins   . Sulfa Antibiotics Rash    REVIEW OF SYSTEMS (Negative unless checked)  Constitutional: [] ?Weight loss  [] ?Fever  [] ?Chills Cardiac: [] ?Chest pain   [] ?Chest pressure   [] ?Palpitations   [] ?Shortness of breath when laying flat   [] ?Shortness of breath at rest   [] ?Shortness of breath with exertion. Vascular:  [] ?Pain in legs with walking   [] ?Pain in legs at rest   [] ?Pain in legs when laying flat   [] ?Claudication   [] ?Pain in feet when walking  [] ?Pain in feet at rest  [] ?Pain in feet when laying flat   [] ?History of DVT   [] ?Phlebitis   [] ?Swelling in legs   [] ?Varicose veins   [] ?Non-healing ulcers Pulmonary:   [] ?Uses home oxygen   [] ?Productive cough   [] ?Hemoptysis   [] ?Wheeze  [] ?COPD   [] ?Asthma Neurologic:  [] ?Dizziness  [] ?Blackouts   [] ?Seizures   [] ?History of stroke   [] ?History of TIA  [] ?Aphasia   [] ?Temporary blindness   [] ?Dysphagia   [] ?Weakness or numbness in arms   [] ?Weakness or numbness in legs Musculoskeletal:  [x] ?Arthritis   [] ?  Joint swelling   [] ?Joint pain   [] ?Low back pain Hematologic:  [] ?Easy bruising  [] ?Easy bleeding   [] ?Hypercoagulable state   [] ?Anemic   Gastrointestinal:  [] ?Blood in stool   [] ?Vomiting blood  [x] ?Gastroesophageal reflux/heartburn   [] ?Abdominal pain Genitourinary:  [] ?Chronic kidney disease   [] ?Difficult urination  [] ?Frequent urination  [] ?Burning with urination   [] ?Hematuria Skin:  [] ?Rashes   [] ?Ulcers   [] ?Wounds Psychological:  [] ?History of anxiety   [] ? History of major depression.  Physical Examination  Vitals:   02/11/20 1536  BP: 120/74  Pulse: 68  Resp: 16  Weight: 161 lb (73 kg)  Height: 5' (1.524 m)   Body mass index is 31.44 kg/m. Gen:  WD/WN, NAD Head: [place]/AT, No temporalis wasting. Ear/Nose/Throat: Hearing grossly intact, nares w/o erythema or drainage, trachea midline Eyes: Conjunctiva clear. Sclera  non-icteric Neck: Supple.  Soft left carotid bruit  Pulmonary:  Good air movement, equal and clear to auscultation bilaterally.  Cardiac: RRR, No JVD Vascular:  Vessel Right Left  Radial Palpable Palpable               Musculoskeletal: M/S 5/5 throughout.  No deformity or atrophy. No edema. Neurologic: CN 2-12 intact. Sensation grossly intact in extremities.  Symmetrical.  Speech is fluent. Motor exam as listed above. Psychiatric: Judgment intact, Mood & affect appropriate for pt's clinical situation. Dermatologic: No rashes or ulcers noted.  No cellulitis or open wounds.      CBC No results found for: WBC, HGB, HCT, MCV, PLT  BMET    Component Value Date/Time   CREATININE 0.96 05/15/2019 0935   GFRNONAA >60 05/15/2019 0935   GFRAA >60 05/15/2019 0935   CrCl cannot be calculated (Patient's most recent lab result is older than the maximum 21 days allowed.).  COAG No results found for: INR, PROTIME  Radiology VAS US CAROTID  Result Date: 02/11/2020 Carotid Arterial Duplex Study Indications:       Carotid artery disease. Comparison Study:  08/02/2019 Performing Technologist: Concha Norway RVT  Examination Guidelines: A complete evaluation includes B-mode imaging, spectral Doppler, color Doppler, and power Doppler as needed of all accessible portions of each vessel. Bilateral testing is considered an integral part of a complete examination. Limited examinations for reoccurring indications may be performed as noted.  Right Carotid Findings: +----------+--------+--------+--------+------------------+--------+           PSV cm/sEDV cm/sStenosisPlaque DescriptionComments +----------+--------+--------+--------+------------------+--------+ CCA Prox  73      13                                         +----------+--------+--------+--------+------------------+--------+ CCA Mid   87      16                                          +----------+--------+--------+--------+------------------+--------+ CCA Distal68      17                                         +----------+--------+--------+--------+------------------+--------+ ICA Prox  74      23      1-39%   calcific                   +----------+--------+--------+--------+------------------+--------+  ICA Mid   49      13                                         +----------+--------+--------+--------+------------------+--------+ ICA Distal64      14                                         +----------+--------+--------+--------+------------------+--------+ ECA       64      7                                          +----------+--------+--------+--------+------------------+--------+ +----------+--------+-------+----------------+-------------------+           PSV cm/sEDV cmsDescribe        Arm Pressure (mmHG) +----------+--------+-------+----------------+-------------------+ [id number]             Multiphasic, WNL                    +----------+--------+-------+----------------+-------------------+ +---------+--------+--+--------+---------+ VertebralPSV cm/s29EDV cm/sAntegrade +---------+--------+--+--------+---------+  Left Carotid Findings: +----------+--------+--------+--------+----------------------+--------+           PSV cm/sEDV cm/sStenosisPlaque Description    Comments +----------+--------+--------+--------+----------------------+--------+ CCA Prox  44      13                                             +----------+--------+--------+--------+----------------------+--------+ CCA Mid   48      14                                             +----------+--------+--------+--------+----------------------+--------+ CCA Distal50      11                                             +----------+--------+--------+--------+----------------------+--------+ ICA Prox  109     25                                              +----------+--------+--------+--------+----------------------+--------+ ICA Mid   115     24      40-59%  calcific and irregular         +----------+--------+--------+--------+----------------------+--------+ ICA Distal102     21                                             +----------+--------+--------+--------+----------------------+--------+ ECA       60      7                                              +----------+--------+--------+--------+----------------------+--------+ +----------+--------+--------+----------------+-------------------+  PSV cm/sEDV cm/sDescribe        Arm Pressure (mmHG) +----------+--------+--------+----------------+-------------------+ [id number]              Multiphasic, WNL                    +----------+--------+--------+----------------+-------------------+ +---------+--------+--+--------+---------+ VertebralPSV cm/s45EDV cm/sAntegrade +---------+--------+--+--------+---------+   Summary: Right Carotid: Velocities in the right ICA are consistent with a 1-39% stenosis.                Mild calcified plaque in the bulb/ICA. Left Carotid: Velocities in the left ICA are consistent with a 40-59% stenosis.               Mild to moderate calcified irregular plaque in the bulb/ICA. Vertebrals:  Bilateral vertebral arteries demonstrate antegrade flow. Subclavians: Normal flow hemodynamics were seen in bilateral subclavian              arteries. *See table(s) above for measurements and observations.  Electronically signed by Leotis Pain MD on 02/11/2020 at 4:35:40 PM.    Final      Assessment/Plan Essential (primary) hypertension blood pressure control important in reducing the progression of atherosclerotic disease. On appropriate oral medications.   Hyperlipidemia lipid control important in reducing the progression of atherosclerotic disease. Continue statin therapy  Carotid stenosis Carotid duplex today reveals stable 1 to 39%  right ICA stenosis and stable 40 to 59% left ICA stenosis.  No role for intervention at this level.  Would continue current medical regimen and plan to recheck in 1 year.    Leotis Pain, MD  02/11/2020 4:47 PM    This note was created with Dragon medical transcription system.  Any errors from dictation are purely unintentional

## 2020-03-20 ENCOUNTER — Telehealth: Payer: Self-pay

## 2020-03-20 DIAGNOSIS — R918 Other nonspecific abnormal finding of lung field: Secondary | ICD-10-CM

## 2020-03-20 DIAGNOSIS — Z87891 Personal history of nicotine dependence: Secondary | ICD-10-CM

## 2020-03-20 NOTE — Telephone Encounter (Signed)
Patient has been notified that the low dose lung cancer screening CT scan is due currently or will be in near future.  Confirmed that patient is within the appropriate age range and asymptomatic, (no signs or symptoms of lung cancer).  Patient denies illness that would prevent curative treatment for lung cancer if found.  Verified smoking history (current smoker, with 48 year 1 ppd history).  Patient is agreeable for CT scan being scheduled.    Patient had her 2nd COVID vaccine on 02/14/2020.  CT scan scheduled for 03/26/2020 @ 2:30.

## 2020-03-23 ENCOUNTER — Other Ambulatory Visit: Payer: Self-pay | Admitting: Orthopedic Surgery

## 2020-03-23 DIAGNOSIS — M25552 Pain in left hip: Secondary | ICD-10-CM

## 2020-03-23 DIAGNOSIS — M5442 Lumbago with sciatica, left side: Secondary | ICD-10-CM

## 2020-03-23 DIAGNOSIS — G8929 Other chronic pain: Secondary | ICD-10-CM

## 2020-03-23 NOTE — Telephone Encounter (Signed)
Smoking history: current, 48.5 pack year 

## 2020-03-23 NOTE — Addendum Note (Signed)
Addended by: Lieutenant Diego on: 03/23/2020 02:36 PM   Modules accepted: Orders

## 2020-03-26 ENCOUNTER — Ambulatory Visit
Admission: RE | Admit: 2020-03-26 | Discharge: 2020-03-26 | Disposition: A | Discharge status: Home / Self Care | Payer: Medicare Other | Source: Ambulatory Visit | Attending: Oncology | Admitting: Oncology

## 2020-03-26 ENCOUNTER — Other Ambulatory Visit: Payer: Self-pay

## 2020-03-26 DIAGNOSIS — R918 Other nonspecific abnormal finding of lung field: Secondary | ICD-10-CM | POA: Insufficient documentation

## 2020-03-26 DIAGNOSIS — Z87891 Personal history of nicotine dependence: Secondary | ICD-10-CM | POA: Diagnosis not present

## 2020-03-31 ENCOUNTER — Encounter: Payer: Self-pay | Admitting: *Deleted

## 2020-04-06 ENCOUNTER — Other Ambulatory Visit: Payer: Self-pay

## 2020-04-06 ENCOUNTER — Ambulatory Visit
Admission: RE | Admit: 2020-04-06 | Discharge: 2020-04-06 | Disposition: A | Discharge status: Home / Self Care | Payer: Medicare Other | Source: Ambulatory Visit | Attending: Orthopedic Surgery | Admitting: Orthopedic Surgery

## 2020-04-06 DIAGNOSIS — M25552 Pain in left hip: Secondary | ICD-10-CM

## 2020-04-06 DIAGNOSIS — G8929 Other chronic pain: Secondary | ICD-10-CM | POA: Diagnosis present

## 2020-04-06 DIAGNOSIS — M5442 Lumbago with sciatica, left side: Secondary | ICD-10-CM | POA: Insufficient documentation

## 2020-04-19 DIAGNOSIS — G8929 Other chronic pain: Secondary | ICD-10-CM | POA: Insufficient documentation

## 2020-06-05 ENCOUNTER — Other Ambulatory Visit: Payer: Self-pay | Admitting: Neurosurgery

## 2020-06-05 DIAGNOSIS — M5136 Other intervertebral disc degeneration, lumbar region: Secondary | ICD-10-CM

## 2020-06-12 ENCOUNTER — Other Ambulatory Visit: Payer: Self-pay

## 2020-06-12 ENCOUNTER — Ambulatory Visit
Admission: RE | Admit: 2020-06-12 | Discharge: 2020-06-12 | Disposition: A | Discharge status: Home / Self Care | Payer: Medicare Other | Source: Ambulatory Visit | Attending: Neurosurgery | Admitting: Neurosurgery

## 2020-06-12 DIAGNOSIS — M4316 Spondylolisthesis, lumbar region: Secondary | ICD-10-CM | POA: Diagnosis present

## 2020-06-12 DIAGNOSIS — M5136 Other intervertebral disc degeneration, lumbar region: Secondary | ICD-10-CM | POA: Diagnosis present

## 2020-09-11 ENCOUNTER — Other Ambulatory Visit: Payer: Self-pay | Admitting: Neurosurgery

## 2020-09-21 ENCOUNTER — Other Ambulatory Visit: Payer: Self-pay

## 2020-09-21 ENCOUNTER — Encounter
Admission: RE | Admit: 2020-09-21 | Discharge: 2020-09-21 | Disposition: A | Discharge status: Home / Self Care | Payer: Medicare Other | Source: Ambulatory Visit | Attending: Neurosurgery | Admitting: Neurosurgery

## 2020-09-21 DIAGNOSIS — Z01818 Encounter for other preprocedural examination: Secondary | ICD-10-CM | POA: Diagnosis present

## 2020-09-21 HISTORY — DX: Hyperlipidemia, unspecified: E78.5

## 2020-09-21 HISTORY — DX: Occlusion and stenosis of unspecified carotid artery: I65.29

## 2020-09-21 HISTORY — DX: Age-related osteoporosis without current pathological fracture: M81.0

## 2020-09-21 HISTORY — DX: Unspecified osteoarthritis, unspecified site: M19.90

## 2020-09-21 HISTORY — DX: Emphysema, unspecified: J43.9

## 2020-09-21 HISTORY — DX: Gastro-esophageal reflux disease without esophagitis: K21.9

## 2020-09-21 HISTORY — DX: Spinal stenosis, site unspecified: M48.00

## 2020-09-21 HISTORY — DX: Unspecified malignant neoplasm of skin, unspecified: C44.90

## 2020-09-21 HISTORY — DX: Polyp of colon: K63.5

## 2020-09-21 HISTORY — DX: Hypothyroidism, unspecified: E03.9

## 2020-09-21 LAB — CBC
HCT: 35.1 % — ABNORMAL LOW (ref 36.0–46.0)
Hemoglobin: 12.4 g/dL (ref 12.0–15.0)
MCH: 32.5 pg (ref 26.0–34.0)
MCHC: 35.3 g/dL (ref 30.0–36.0)
MCV: 91.9 fL (ref 80.0–100.0)
Platelets: 229 10*3/uL (ref 150–400)
RBC: 3.82 MIL/uL — ABNORMAL LOW (ref 3.87–5.11)
RDW: 12.2 % (ref 11.5–15.5)
WBC: 7 10*3/uL (ref 4.0–10.5)
nRBC: 0 % (ref 0.0–0.2)

## 2020-09-21 LAB — URINALYSIS, ROUTINE W REFLEX MICROSCOPIC
Bilirubin Urine: NEGATIVE
Glucose, UA: NEGATIVE mg/dL
Hgb urine dipstick: NEGATIVE
Ketones, ur: NEGATIVE mg/dL
Leukocytes,Ua: NEGATIVE
Nitrite: NEGATIVE
Protein, ur: NEGATIVE mg/dL
Specific Gravity, Urine: 1.01 (ref 1.005–1.030)
pH: 8 (ref 5.0–8.0)

## 2020-09-21 LAB — TYPE AND SCREEN
ABO/RH(D): O POS
Antibody Screen: NEGATIVE

## 2020-09-21 LAB — BASIC METABOLIC PANEL
Anion gap: 12 (ref 5–15)
BUN: 11 mg/dL (ref 8–23)
CO2: 26 mmol/L (ref 22–32)
Calcium: 9.5 mg/dL (ref 8.9–10.3)
Chloride: 89 mmol/L — ABNORMAL LOW (ref 98–111)
Creatinine, Ser: 0.95 mg/dL (ref 0.44–1.00)
GFR, Estimated: >60 mL/min (ref >60)
Glucose, Bld: 104 mg/dL — ABNORMAL HIGH (ref 70–99)
Potassium: 3.4 mmol/L — ABNORMAL LOW (ref 3.5–5.1)
Sodium: 127 mmol/L — ABNORMAL LOW (ref 135–145)

## 2020-09-21 LAB — SURGICAL PCR SCREEN
MRSA, PCR: NEGATIVE
Staphylococcus aureus: NEGATIVE

## 2020-09-21 LAB — APTT: aPTT: 29 seconds (ref 24–36)

## 2020-09-21 LAB — PROTIME-INR
INR: 1 (ref 0.8–1.2)
Prothrombin Time: 13.1 seconds (ref 11.4–15.2)

## 2020-09-21 NOTE — Patient Instructions (Signed)
Your procedure is scheduled on: Wednesday, Oct. 27 Report to Day Surgery on the 2nd floor of the [facility]. To find out your arrival time, please call [phone] between 1PM - 3PM on: Tuesday, Oct. 26  REMEMBER: Instructions that are not followed completely may result in serious medical risk, up to and including death; or upon the discretion of your surgeon and anesthesiologist your surgery may need to be rescheduled.  Do not eat food after midnight the night before surgery.  No gum chewing, lozengers or hard candies.  You may however, drink CLEAR liquids up to 2 hours before you are scheduled to arrive for your surgery. Do not drink anything within 2 hours of your scheduled arrival time.  Clear liquids include: - water  - apple juice without pulp - gatorade (not RED, PURPLE, OR BLUE) - black coffee or tea (Do NOT add milk or creamers to the coffee or tea) Do NOT drink anything that is not on this list.  TAKE THESE MEDICATIONS THE MORNING OF SURGERY WITH A SIP OF WATER:  1.  Amlodipine 2.  Bupropion 3.  nexium - (take one the night before and one on the morning of surgery - helps to prevent nausea after surgery.) 4.  lyrica  Follow recommendations from Cardiologist, Pulmonologist or PCP regarding stopping Aspirin. Already stopped.  One week prior to surgery: Stop MELOXICAM, Anti-inflammatories (NSAIDS) such as Advil, Aleve, Ibuprofen, Motrin, Naproxen, Naprosyn and Aspirin based products such as Excedrin, Goodys Powder, BC Powder. Stop ANY OVER THE COUNTER supplements until after surgery. (You may continue taking Tylenol and multivitamin.)  No Alcohol for 24 hours before or after surgery.  No Smoking including e-cigarettes for 24 hours prior to surgery.  No chewable tobacco products for at least 6 hours prior to surgery.  No nicotine patches on the day of surgery.  Do not use any "recreational" drugs for at least a week prior to your surgery.  Please be advised that the  combination of cocaine and anesthesia may have negative outcomes, up to and including death. If you test positive for cocaine, your surgery will be cancelled.  On the morning of surgery brush your teeth with toothpaste and water, you may rinse your mouth with mouthwash if you wish. Do not swallow any toothpaste or mouthwash.  Do not wear jewelry, make-up, hairpins, clips or nail polish.  Do not wear lotions, powders, or perfumes.   Do not shave 48 hours prior to surgery.   Do not bring valuables to the hospital. [facility] is not responsible for any missing/lost belongings or valuables.   Use CHG Soap as directed on instruction sheet.  Notify your doctor if there is any change in your medical condition (cold, fever, infection).  Wear comfortable clothing (specific to your surgery type) to the hospital.  Plan for stool softeners for home use; pain medications have a tendency to cause constipation. You can also help prevent constipation by eating foods high in fiber such as fruits and vegetables and drinking plenty of fluids as your diet allows.  After surgery, you can help prevent lung complications by doing breathing exercises.  Take deep breaths and cough every 1-2 hours. Your doctor may order a device called an Incentive Spirometer to help you take deep breaths.  If you are being admitted to the hospital overnight, leave your suitcase in the car. After surgery it may be brought to your room.  If you are being discharged the day of surgery, you will not be  allowed to drive home. You will need a responsible adult (18 years or older) to drive you home and stay with you that night.   If you are taking public transportation, you will need to have a responsible adult (18 years or older) with you. Please confirm with your physician that it is acceptable to use public transportation.   Please call the [place] Dept. at [phone] if you have any questions about these  instructions.  Visitation Policy:  Patients undergoing a surgery or procedure may have one family member or support person with them as long as that person is not COVID-19 positive or experiencing its symptoms.  That person may remain in the waiting area during the procedure.  Inpatient Visitation Update:   In an effort to ensure the safety of our team members and our patients, we are implementing a change to our visitation policy:  Effective Monday, Aug. 9, at 7 a.m., inpatients will be allowed one support person.  o The support person may change daily.  o The support person must pass our screening, gel in and out, and wear a mask at all times, including in the patient's room.  o Patients must also wear a mask when staff or their support person are in the room.  o Masking is required regardless of vaccination status.  Systemwide, no visitors 17 or younger.

## 2020-09-21 NOTE — Progress Notes (Signed)
  [facility] Perioperative Services: Pre-Admission/Anesthesia Testing  Abnormal Lab Notification   Date: 09/21/20  Name: Carol Carey MRN:   [id number]  Re: Abnormal labs noted during PAT appointment   Provider(s) Notified: Meade Maw, MD Notification mode: Routed and/or faxed via [facility]   ABNORMAL LAB VALUE(S): Lab Results  Component Value Date   NA 127 (L) 09/21/2020    Notes:  Patient currently on daily low dose thiazide diuretic. Patient is scheduled for a L5-S1 ANTERIOR LUMBAR INTERBODY FUSION, L3-S1 INSTRUMENTATION (N/A ) ABDOMINAL EXPOSURE (N/A ) on 09/30/2020. Sending to primary surgeon for review. Will have SDS staff recheck NA+ on the day of surgery; order entered. This is a Community education officer; no formal response is required.  Honor Loh, MSN, APRN, FNP-C, CEN [facility]  Peri-operative Services Nurse Practitioner Phone: [phone] 09/21/20 3:07 PM

## 2020-09-28 ENCOUNTER — Other Ambulatory Visit: Payer: Self-pay

## 2020-09-28 ENCOUNTER — Other Ambulatory Visit
Admission: RE | Admit: 2020-09-28 | Discharge: 2020-09-28 | Disposition: A | Discharge status: Home / Self Care | Payer: Medicare Other | Source: Ambulatory Visit | Attending: Neurosurgery | Admitting: Neurosurgery

## 2020-09-28 DIAGNOSIS — Z20822 Contact with and (suspected) exposure to covid-19: Secondary | ICD-10-CM | POA: Insufficient documentation

## 2020-09-28 DIAGNOSIS — Z01812 Encounter for preprocedural laboratory examination: Secondary | ICD-10-CM | POA: Insufficient documentation

## 2020-09-28 LAB — SARS CORONAVIRUS 2 (TAT 6-24 HRS): SARS Coronavirus 2: NEGATIVE

## 2020-09-30 ENCOUNTER — Inpatient Hospital Stay: Payer: Medicare Other | Admitting: Urgent Care

## 2020-09-30 ENCOUNTER — Inpatient Hospital Stay: Payer: Medicare Other

## 2020-09-30 ENCOUNTER — Encounter
Admission: RE | Disposition: A | Discharge status: Home Health | Payer: Self-pay | Source: Home / Self Care | Attending: Neurosurgery

## 2020-09-30 ENCOUNTER — Encounter: Payer: Self-pay | Admitting: Neurosurgery

## 2020-09-30 ENCOUNTER — Other Ambulatory Visit: Payer: Self-pay

## 2020-09-30 ENCOUNTER — Inpatient Hospital Stay
Admission: RE | Admit: 2020-09-30 | Discharge: 2020-10-02 | DRG: 460 | Disposition: A | Discharge status: Home Health | Payer: Medicare Other | Attending: Neurosurgery | Admitting: Neurosurgery

## 2020-09-30 DIAGNOSIS — M810 Age-related osteoporosis without current pathological fracture: Secondary | ICD-10-CM | POA: Diagnosis present

## 2020-09-30 DIAGNOSIS — Z87891 Personal history of nicotine dependence: Secondary | ICD-10-CM

## 2020-09-30 DIAGNOSIS — E782 Mixed hyperlipidemia: Secondary | ICD-10-CM | POA: Diagnosis present

## 2020-09-30 DIAGNOSIS — Z885 Allergy status to narcotic agent status: Secondary | ICD-10-CM | POA: Diagnosis not present

## 2020-09-30 DIAGNOSIS — Z882 Allergy status to sulfonamides status: Secondary | ICD-10-CM

## 2020-09-30 DIAGNOSIS — Z8249 Family history of ischemic heart disease and other diseases of the circulatory system: Secondary | ICD-10-CM

## 2020-09-30 DIAGNOSIS — M4807 Spinal stenosis, lumbosacral region: Secondary | ICD-10-CM | POA: Diagnosis present

## 2020-09-30 DIAGNOSIS — I6529 Occlusion and stenosis of unspecified carotid artery: Secondary | ICD-10-CM | POA: Diagnosis present

## 2020-09-30 DIAGNOSIS — K219 Gastro-esophageal reflux disease without esophagitis: Secondary | ICD-10-CM | POA: Diagnosis present

## 2020-09-30 DIAGNOSIS — E039 Hypothyroidism, unspecified: Secondary | ICD-10-CM | POA: Diagnosis present

## 2020-09-30 DIAGNOSIS — I10 Essential (primary) hypertension: Secondary | ICD-10-CM | POA: Diagnosis present

## 2020-09-30 DIAGNOSIS — M5416 Radiculopathy, lumbar region: Principal | ICD-10-CM | POA: Diagnosis present

## 2020-09-30 DIAGNOSIS — Z90710 Acquired absence of both cervix and uterus: Secondary | ICD-10-CM

## 2020-09-30 DIAGNOSIS — Z419 Encounter for procedure for purposes other than remedying health state, unspecified: Secondary | ICD-10-CM

## 2020-09-30 DIAGNOSIS — Z79890 Hormone replacement therapy (postmenopausal): Secondary | ICD-10-CM

## 2020-09-30 DIAGNOSIS — Z79899 Other long term (current) drug therapy: Secondary | ICD-10-CM

## 2020-09-30 DIAGNOSIS — Z880 Allergy status to penicillin: Secondary | ICD-10-CM | POA: Diagnosis not present

## 2020-09-30 DIAGNOSIS — M4316 Spondylolisthesis, lumbar region: Secondary | ICD-10-CM | POA: Diagnosis not present

## 2020-09-30 DIAGNOSIS — M4317 Spondylolisthesis, lumbosacral region: Secondary | ICD-10-CM | POA: Diagnosis present

## 2020-09-30 DIAGNOSIS — M79605 Pain in left leg: Secondary | ICD-10-CM | POA: Diagnosis present

## 2020-09-30 DIAGNOSIS — Z981 Arthrodesis status: Secondary | ICD-10-CM

## 2020-09-30 DIAGNOSIS — Z85828 Personal history of other malignant neoplasm of skin: Secondary | ICD-10-CM | POA: Diagnosis not present

## 2020-09-30 DIAGNOSIS — F419 Anxiety disorder, unspecified: Secondary | ICD-10-CM | POA: Diagnosis present

## 2020-09-30 DIAGNOSIS — Z01812 Encounter for preprocedural laboratory examination: Secondary | ICD-10-CM

## 2020-09-30 DIAGNOSIS — G4730 Sleep apnea, unspecified: Secondary | ICD-10-CM | POA: Diagnosis present

## 2020-09-30 DIAGNOSIS — Z20822 Contact with and (suspected) exposure to covid-19: Secondary | ICD-10-CM | POA: Diagnosis present

## 2020-09-30 DIAGNOSIS — Z888 Allergy status to other drugs, medicaments and biological substances status: Secondary | ICD-10-CM | POA: Diagnosis not present

## 2020-09-30 DIAGNOSIS — Z189 Retained foreign body fragments, unspecified material: Secondary | ICD-10-CM

## 2020-09-30 DIAGNOSIS — M47817 Spondylosis without myelopathy or radiculopathy, lumbosacral region: Secondary | ICD-10-CM | POA: Diagnosis present

## 2020-09-30 DIAGNOSIS — Z91040 Latex allergy status: Secondary | ICD-10-CM

## 2020-09-30 DIAGNOSIS — Z808 Family history of malignant neoplasm of other organs or systems: Secondary | ICD-10-CM | POA: Diagnosis not present

## 2020-09-30 DIAGNOSIS — Z86010 Personal history of colonic polyps: Secondary | ICD-10-CM

## 2020-09-30 DIAGNOSIS — J439 Emphysema, unspecified: Secondary | ICD-10-CM | POA: Diagnosis present

## 2020-09-30 DIAGNOSIS — M4310 Spondylolisthesis, site unspecified: Secondary | ICD-10-CM

## 2020-09-30 HISTORY — PX: ABDOMINAL EXPOSURE: SHX5708

## 2020-09-30 HISTORY — PX: ANTERIOR AND POSTERIOR SPINAL FUSION: SHX2259

## 2020-09-30 LAB — POCT I-STAT, CHEM 8
BUN: 11 mg/dL (ref 8–23)
Calcium, Ion: 1.18 mmol/L (ref 1.15–1.40)
Chloride: 94 mmol/L — ABNORMAL LOW (ref 98–111)
Creatinine, Ser: 1 mg/dL (ref 0.44–1.00)
Glucose, Bld: 114 mg/dL — ABNORMAL HIGH (ref 70–99)
HCT: 33 % — ABNORMAL LOW (ref 36.0–46.0)
Hemoglobin: 11.2 g/dL — ABNORMAL LOW (ref 12.0–15.0)
Potassium: 3.4 mmol/L — ABNORMAL LOW (ref 3.5–5.1)
Sodium: 133 mmol/L — ABNORMAL LOW (ref 135–145)
TCO2: 24 mmol/L (ref 22–32)

## 2020-09-30 LAB — ABO/RH: ABO/RH(D): O POS

## 2020-09-30 SURGERY — ANTERIOR AND POSTERIOR SPINAL FUSION
Anesthesia: General

## 2020-09-30 MED ORDER — REMIFENTANIL HCL 1 MG IV SOLR
INTRAVENOUS | Status: AC
  Filled 2020-09-30: qty 1000

## 2020-09-30 MED ORDER — ENOXAPARIN SODIUM 40 MG/0.4ML ~~LOC~~ SOLN
40.0000 mg | SUBCUTANEOUS | Status: DC
  Administered 2020-10-01 – 2020-10-02 (×2): 40 mg via SUBCUTANEOUS
  Filled 2020-09-30 (×2): qty 0.4

## 2020-09-30 MED ORDER — PHENOL 1.4 % MT LIQD
1.0000 | OROMUCOSAL | Status: DC | PRN
  Filled 2020-09-30: qty 177

## 2020-09-30 MED ORDER — ORAL CARE MOUTH RINSE: 15.0000 mL | Freq: Once | OROMUCOSAL | Status: AC

## 2020-09-30 MED ORDER — LACTATED RINGERS IV SOLN: INTRAVENOUS | Status: DC

## 2020-09-30 MED ORDER — ACETAMINOPHEN 500 MG PO TABS
1000.0000 mg | ORAL_TABLET | Freq: Four times a day (QID) | ORAL | Status: DC
  Administered 2020-09-30 – 2020-10-02 (×6): 1000 mg via ORAL
  Filled 2020-09-30 (×6): qty 2

## 2020-09-30 MED ORDER — ONDANSETRON HCL 4 MG/2ML IJ SOLN
INTRAMUSCULAR | Status: DC | PRN
  Administered 2020-09-30: 4 mg via INTRAVENOUS

## 2020-09-30 MED ORDER — PREGABALIN 75 MG PO CAPS
75.0000 mg | ORAL_CAPSULE | Freq: Every day | ORAL | Status: DC
  Administered 2020-10-01 – 2020-10-02 (×2): 75 mg via ORAL
  Filled 2020-09-30 (×2): qty 1

## 2020-09-30 MED ORDER — SODIUM CHLORIDE 0.9% FLUSH: 3.0000 mL | INTRAVENOUS | Status: DC | PRN

## 2020-09-30 MED ORDER — ACETAMINOPHEN 10 MG/ML IV SOLN
INTRAVENOUS | Status: DC | PRN
  Administered 2020-09-30: 1000 mg via INTRAVENOUS

## 2020-09-30 MED ORDER — BUPIVACAINE-EPINEPHRINE 0.5% -1:200000 IJ SOLN
INTRAMUSCULAR | Status: DC | PRN
  Administered 2020-09-30: 10 mL

## 2020-09-30 MED ORDER — HYDROMORPHONE HCL 1 MG/ML IJ SOLN
INTRAMUSCULAR | Status: DC | PRN
  Administered 2020-09-30 (×2): .5 mg via INTRAVENOUS

## 2020-09-30 MED ORDER — FENTANYL CITRATE (PF) 100 MCG/2ML IJ SOLN
INTRAMUSCULAR | Status: AC
  Filled 2020-09-30: qty 2

## 2020-09-30 MED ORDER — PROPOFOL 500 MG/50ML IV EMUL
INTRAVENOUS | Status: AC
  Filled 2020-09-30: qty 50

## 2020-09-30 MED ORDER — SENNA 8.6 MG PO TABS
2.0000 | ORAL_TABLET | Freq: Two times a day (BID) | ORAL | Status: DC
  Administered 2020-09-30 – 2020-10-02 (×4): 17.2 mg via ORAL
  Filled 2020-09-30 (×4): qty 2

## 2020-09-30 MED ORDER — MIDAZOLAM HCL 2 MG/2ML IJ SOLN
INTRAMUSCULAR | Status: DC | PRN
  Administered 2020-09-30: 2 mg via INTRAVENOUS

## 2020-09-30 MED ORDER — AMLODIPINE BESYLATE 5 MG PO TABS
5.0000 mg | ORAL_TABLET | Freq: Every day | ORAL | Status: DC
  Administered 2020-10-01 – 2020-10-02 (×2): 5 mg via ORAL
  Filled 2020-09-30 (×2): qty 1

## 2020-09-30 MED ORDER — METHOCARBAMOL 1000 MG/10ML IJ SOLN
500.0000 mg | Freq: Four times a day (QID) | INTRAVENOUS | Status: DC
  Administered 2020-09-30: 500 mg via INTRAVENOUS
  Filled 2020-09-30: qty 5

## 2020-09-30 MED ORDER — SODIUM CHLORIDE 0.9 % IV SOLN: INTRAVENOUS | Status: DC | PRN

## 2020-09-30 MED ORDER — MIDAZOLAM HCL 2 MG/2ML IJ SOLN
INTRAMUSCULAR | Status: AC
  Filled 2020-09-30: qty 2

## 2020-09-30 MED ORDER — VANCOMYCIN HCL IN DEXTROSE 1-5 GM/200ML-% IV SOLN
INTRAVENOUS | Status: AC
  Filled 2020-09-30: qty 200

## 2020-09-30 MED ORDER — LIDOCAINE HCL (CARDIAC) PF 100 MG/5ML IV SOSY
PREFILLED_SYRINGE | INTRAVENOUS | Status: DC | PRN
  Administered 2020-09-30: 80 mg via INTRAVENOUS

## 2020-09-30 MED ORDER — ROCURONIUM BROMIDE 100 MG/10ML IV SOLN
INTRAVENOUS | Status: DC | PRN
  Administered 2020-09-30: 50 mg via INTRAVENOUS

## 2020-09-30 MED ORDER — ALUM & MAG HYDROXIDE-SIMETH 200-200-20 MG/5ML PO SUSP: 30.0000 mL | Freq: Four times a day (QID) | ORAL | Status: DC | PRN

## 2020-09-30 MED ORDER — ONDANSETRON HCL 4 MG/2ML IJ SOLN: 4.0000 mg | Freq: Four times a day (QID) | INTRAMUSCULAR | Status: DC | PRN

## 2020-09-30 MED ORDER — DEXAMETHASONE SODIUM PHOSPHATE 10 MG/ML IJ SOLN
INTRAMUSCULAR | Status: AC
  Filled 2020-09-30: qty 1

## 2020-09-30 MED ORDER — FENTANYL CITRATE (PF) 100 MCG/2ML IJ SOLN
INTRAMUSCULAR | Status: DC | PRN
  Administered 2020-09-30: 100 ug via INTRAVENOUS

## 2020-09-30 MED ORDER — ATORVASTATIN CALCIUM 20 MG PO TABS
20.0000 mg | ORAL_TABLET | Freq: Every day | ORAL | Status: DC
  Administered 2020-10-01 – 2020-10-02 (×2): 20 mg via ORAL
  Filled 2020-09-30 (×2): qty 1

## 2020-09-30 MED ORDER — PROPOFOL 10 MG/ML IV BOLUS
INTRAVENOUS | Status: AC
  Filled 2020-09-30: qty 40

## 2020-09-30 MED ORDER — OXYCODONE HCL 5 MG PO TABS
10.0000 mg | ORAL_TABLET | ORAL | Status: DC | PRN
  Administered 2020-09-30 – 2020-10-01 (×3): 10 mg via ORAL
  Filled 2020-09-30 (×3): qty 2

## 2020-09-30 MED ORDER — ACETAMINOPHEN 10 MG/ML IV SOLN: 1000.0000 mg | Freq: Once | INTRAVENOUS | Status: DC | PRN

## 2020-09-30 MED ORDER — SODIUM CHLORIDE 0.9 % IV SOLN: 50.0000 mL/h | INTRAVENOUS | Status: DC

## 2020-09-30 MED ORDER — CHLORHEXIDINE GLUCONATE 0.12 % MT SOLN
OROMUCOSAL | Status: AC
  Administered 2020-09-30: 15 mL via OROMUCOSAL
  Filled 2020-09-30: qty 15

## 2020-09-30 MED ORDER — LEVOTHYROXINE SODIUM 112 MCG PO TABS
112.0000 ug | ORAL_TABLET | Freq: Every day | ORAL | Status: DC
  Administered 2020-09-30 – 2020-10-01 (×2): 112 ug via ORAL
  Filled 2020-09-30 (×3): qty 1

## 2020-09-30 MED ORDER — EPHEDRINE SULFATE 50 MG/ML IJ SOLN
INTRAMUSCULAR | Status: DC | PRN
  Administered 2020-09-30 (×2): 5 mg via INTRAVENOUS
  Administered 2020-09-30: 10 mg via INTRAVENOUS
  Administered 2020-09-30: 5 mg via INTRAVENOUS

## 2020-09-30 MED ORDER — OXYCODONE HCL 5 MG/5ML PO SOLN: 5.0000 mg | Freq: Once | ORAL | Status: DC | PRN

## 2020-09-30 MED ORDER — LOSARTAN POTASSIUM 50 MG PO TABS
100.0000 mg | ORAL_TABLET | Freq: Every day | ORAL | Status: DC
  Administered 2020-10-01 – 2020-10-02 (×2): 100 mg via ORAL
  Filled 2020-09-30 (×2): qty 2

## 2020-09-30 MED ORDER — SODIUM CHLORIDE 0.9% FLUSH
3.0000 mL | Freq: Two times a day (BID) | INTRAVENOUS | Status: DC
  Administered 2020-10-01 – 2020-10-02 (×3): 3 mL via INTRAVENOUS

## 2020-09-30 MED ORDER — ONDANSETRON HCL 4 MG/2ML IJ SOLN: 4.0000 mg | Freq: Once | INTRAMUSCULAR | Status: DC | PRN

## 2020-09-30 MED ORDER — CEFAZOLIN SODIUM-DEXTROSE 2-4 GM/100ML-% IV SOLN
2.0000 g | Freq: Once | INTRAVENOUS | Status: AC
  Administered 2020-09-30: 2 g via INTRAVENOUS

## 2020-09-30 MED ORDER — DOCUSATE SODIUM 100 MG PO CAPS
100.0000 mg | ORAL_CAPSULE | Freq: Two times a day (BID) | ORAL | Status: DC
  Administered 2020-09-30 – 2020-10-02 (×4): 100 mg via ORAL
  Filled 2020-09-30 (×4): qty 1

## 2020-09-30 MED ORDER — BUPIVACAINE LIPOSOME 1.3 % IJ SUSP
INTRAMUSCULAR | Status: AC
  Filled 2020-09-30: qty 20

## 2020-09-30 MED ORDER — HYDROMORPHONE HCL 1 MG/ML IJ SOLN
INTRAMUSCULAR | Status: AC
  Filled 2020-09-30: qty 1

## 2020-09-30 MED ORDER — DEXAMETHASONE SODIUM PHOSPHATE 10 MG/ML IJ SOLN
INTRAMUSCULAR | Status: DC | PRN
  Administered 2020-09-30: 10 mg via INTRAVENOUS

## 2020-09-30 MED ORDER — ACETAMINOPHEN 10 MG/ML IV SOLN
INTRAVENOUS | Status: AC
  Filled 2020-09-30: qty 100

## 2020-09-30 MED ORDER — FENTANYL CITRATE (PF) 100 MCG/2ML IJ SOLN
25.0000 ug | INTRAMUSCULAR | Status: DC | PRN
  Administered 2020-09-30 (×2): 25 ug via INTRAVENOUS

## 2020-09-30 MED ORDER — BISACODYL 5 MG PO TBEC: 5.0000 mg | DELAYED_RELEASE_TABLET | Freq: Every day | ORAL | Status: DC | PRN

## 2020-09-30 MED ORDER — SODIUM CHLORIDE 0.9 % IV SOLN
INTRAVENOUS | Status: DC | PRN
  Administered 2020-09-30: 15 ug/min via INTRAVENOUS

## 2020-09-30 MED ORDER — SODIUM CHLORIDE 0.9 % IV SOLN
INTRAVENOUS | Status: DC | PRN
  Administered 2020-09-30: 40 mL

## 2020-09-30 MED ORDER — MONTELUKAST SODIUM 10 MG PO TABS
10.0000 mg | ORAL_TABLET | Freq: Every day | ORAL | Status: DC
  Administered 2020-09-30 – 2020-10-01 (×2): 10 mg via ORAL
  Filled 2020-09-30 (×2): qty 1

## 2020-09-30 MED ORDER — BUPIVACAINE HCL (PF) 0.5 % IJ SOLN
INTRAMUSCULAR | Status: DC | PRN
  Administered 2020-09-30: 20 mL

## 2020-09-30 MED ORDER — BUPIVACAINE-EPINEPHRINE (PF) 0.5% -1:200000 IJ SOLN
INTRAMUSCULAR | Status: AC
  Filled 2020-09-30: qty 180

## 2020-09-30 MED ORDER — OXYCODONE HCL 5 MG PO TABS: 5.0000 mg | ORAL_TABLET | Freq: Once | ORAL | Status: DC | PRN

## 2020-09-30 MED ORDER — OXYCODONE HCL 5 MG PO TABS
5.0000 mg | ORAL_TABLET | ORAL | Status: DC | PRN
  Administered 2020-10-01 – 2020-10-02 (×4): 5 mg via ORAL
  Filled 2020-09-30 (×4): qty 1

## 2020-09-30 MED ORDER — METHOCARBAMOL 500 MG PO TABS
750.0000 mg | ORAL_TABLET | Freq: Four times a day (QID) | ORAL | Status: DC
  Administered 2020-09-30 – 2020-10-02 (×5): 750 mg via ORAL
  Filled 2020-09-30: qty 2
  Filled 2020-09-30: qty 1.5
  Filled 2020-09-30: qty 2
  Filled 2020-09-30: qty 1.5
  Filled 2020-09-30 (×3): qty 2

## 2020-09-30 MED ORDER — ONDANSETRON HCL 4 MG/2ML IJ SOLN
INTRAMUSCULAR | Status: AC
  Filled 2020-09-30: qty 2

## 2020-09-30 MED ORDER — ONDANSETRON HCL 4 MG PO TABS: 4.0000 mg | ORAL_TABLET | Freq: Four times a day (QID) | ORAL | Status: DC | PRN

## 2020-09-30 MED ORDER — CEFAZOLIN SODIUM-DEXTROSE 2-4 GM/100ML-% IV SOLN
INTRAVENOUS | Status: AC
  Filled 2020-09-30: qty 100

## 2020-09-30 MED ORDER — FENTANYL CITRATE (PF) 100 MCG/2ML IJ SOLN
INTRAMUSCULAR | Status: AC
  Administered 2020-09-30: 25 ug via INTRAVENOUS
  Filled 2020-09-30: qty 2

## 2020-09-30 MED ORDER — THROMBIN 5000 UNITS EX SOLR
CUTANEOUS | Status: DC | PRN
  Administered 2020-09-30: 5000 [IU] via TOPICAL

## 2020-09-30 MED ORDER — KETAMINE HCL 50 MG/ML IJ SOLN
INTRAMUSCULAR | Status: AC
  Filled 2020-09-30: qty 10

## 2020-09-30 MED ORDER — CHLORHEXIDINE GLUCONATE 0.12 % MT SOLN: 15.0000 mL | Freq: Once | OROMUCOSAL | Status: AC

## 2020-09-30 MED ORDER — POLYETHYLENE GLYCOL 3350 17 G PO PACK: 17.0000 g | PACK | Freq: Every day | ORAL | Status: DC | PRN

## 2020-09-30 MED ORDER — THROMBIN 5000 UNITS EX SOLR
CUTANEOUS | Status: AC
  Filled 2020-09-30: qty 20000

## 2020-09-30 MED ORDER — VANCOMYCIN HCL IN DEXTROSE 1-5 GM/200ML-% IV SOLN
1000.0000 mg | Freq: Once | INTRAVENOUS | Status: AC
  Administered 2020-09-30: 1000 mg via INTRAVENOUS

## 2020-09-30 MED ORDER — PROPOFOL 500 MG/50ML IV EMUL
INTRAVENOUS | Status: DC | PRN
  Administered 2020-09-30: 75 ug/kg/min via INTRAVENOUS

## 2020-09-30 MED ORDER — PANTOPRAZOLE SODIUM 40 MG PO TBEC
40.0000 mg | DELAYED_RELEASE_TABLET | Freq: Every day | ORAL | Status: DC
  Administered 2020-10-01 – 2020-10-02 (×2): 40 mg via ORAL
  Filled 2020-09-30 (×2): qty 1

## 2020-09-30 MED ORDER — PROPOFOL 10 MG/ML IV BOLUS
INTRAVENOUS | Status: DC | PRN
  Administered 2020-09-30: 100 mg via INTRAVENOUS

## 2020-09-30 MED ORDER — PHENYLEPHRINE HCL (PRESSORS) 10 MG/ML IV SOLN
INTRAVENOUS | Status: DC | PRN
  Administered 2020-09-30: 100 ug via INTRAVENOUS

## 2020-09-30 MED ORDER — SUGAMMADEX SODIUM 200 MG/2ML IV SOLN
INTRAVENOUS | Status: DC | PRN
  Administered 2020-09-30: 150 mg via INTRAVENOUS

## 2020-09-30 MED ORDER — ATENOLOL 25 MG PO TABS
50.0000 mg | ORAL_TABLET | Freq: Every day | ORAL | Status: DC
  Administered 2020-09-30 – 2020-10-01 (×2): 50 mg via ORAL
  Filled 2020-09-30 (×2): qty 2

## 2020-09-30 MED ORDER — PROPOFOL 10 MG/ML IV BOLUS
INTRAVENOUS | Status: AC
  Filled 2020-09-30: qty 20

## 2020-09-30 MED ORDER — MENTHOL 3 MG MT LOZG
1.0000 | LOZENGE | OROMUCOSAL | Status: DC | PRN
  Filled 2020-09-30: qty 9

## 2020-09-30 MED ORDER — FLEET ENEMA 7-19 GM/118ML RE ENEM: 1.0000 | ENEMA | Freq: Once | RECTAL | Status: DC | PRN

## 2020-09-30 MED ORDER — KETAMINE HCL 50 MG/ML IJ SOLN
INTRAMUSCULAR | Status: DC | PRN
  Administered 2020-09-30 (×2): 10 mg via INTRAMUSCULAR
  Administered 2020-09-30: 30 mg via INTRAMUSCULAR
  Administered 2020-09-30: 10 mg via INTRAMUSCULAR

## 2020-09-30 MED ORDER — HYDROMORPHONE HCL 1 MG/ML IJ SOLN: 0.5000 mg | INTRAMUSCULAR | Status: DC | PRN

## 2020-09-30 MED ORDER — REMIFENTANIL HCL 1 MG IV SOLR
INTRAVENOUS | Status: DC | PRN
  Administered 2020-09-30: .01 ug/kg/min via INTRAVENOUS

## 2020-09-30 MED ORDER — BUPROPION HCL ER (XL) 150 MG PO TB24
150.0000 mg | ORAL_TABLET | Freq: Every day | ORAL | Status: DC
  Administered 2020-10-01 – 2020-10-02 (×2): 150 mg via ORAL
  Filled 2020-09-30 (×2): qty 1

## 2020-09-30 SURGICAL SUPPLY — 133 items
ADH SKN CLS APL DERMABOND .7 (GAUZE/BANDAGES/DRESSINGS)
AGENT HMST MTR 8 SURGIFLO (HEMOSTASIS) ×1
ALARA neuro access needle kit diamond tip ×4 IMPLANT
APL PRP STRL LF DISP 70% ISPRP (MISCELLANEOUS) ×4
APPLIER CLIP 11 MED OPEN (CLIP) ×12
APPLIER CLIP 13 LRG OPEN (CLIP)
APPLIER CLIP 9.375 SM OPEN (CLIP)
APR CLP LRG 13 20 CLIP (CLIP)
APR CLP MED 11 20 MLT OPN (CLIP) ×4
APR CLP SM 9.3 20 MLT OPN (CLIP)
BLADE CLIPPER SURG (BLADE) ×3 IMPLANT
BUR NEURO DRILL SOFT 3.0X3.8M (BURR) ×3 IMPLANT
CANISTER SUCT 1200ML W/VALVE (MISCELLANEOUS) ×3 IMPLANT
CAP LOCKING SPINE (Cap) ×14 IMPLANT
CHLORAPREP W/TINT 26 (MISCELLANEOUS) ×13 IMPLANT
CLIP APPLIE 11 MED OPEN (CLIP) IMPLANT
CLIP APPLIE 13 LRG OPEN (CLIP) IMPLANT
CLIP APPLIE 9.375 SM OPEN (CLIP) IMPLANT
CORD BIP STRL DISP 12FT (MISCELLANEOUS) ×3 IMPLANT
COUNTER NEEDLE 20/40 LG (NEEDLE) ×3 IMPLANT
COVER BACK TABLE REUSABLE LG (DRAPES) ×6 IMPLANT
COVER WAND RF STERILE (DRAPES) ×6 IMPLANT
CUP MEDICINE 2OZ PLAST GRAD ST (MISCELLANEOUS) ×3 IMPLANT
DERMABOND ADVANCED (GAUZE/BANDAGES/DRESSINGS)
DERMABOND ADVANCED .7 DNX12 (GAUZE/BANDAGES/DRESSINGS) ×2 IMPLANT
DISSECTOR STICK (MISCELLANEOUS) ×4 IMPLANT
DRAPE C-ARM 42X72 X-RAY (DRAPES) ×8 IMPLANT
DRAPE C-ARMOR (DRAPES) ×4 IMPLANT
DRAPE INCISE IOBAN 66X45 STRL (DRAPES) ×6 IMPLANT
DRAPE INCISE IOBAN 66X60 STRL (DRAPES) ×1 IMPLANT
DRAPE LAPAROTOMY 100X77 ABD (DRAPES) ×6 IMPLANT
DRAPE MICROSCOPE SPINE 48X150 (DRAPES) ×1 IMPLANT
DRAPE SURG 17X11 SM STRL (DRAPES) ×24 IMPLANT
DRESSING SURGICEL FIBRLLR 1X2 (HEMOSTASIS) ×1 IMPLANT
DRSG OPSITE POSTOP 4X12 (GAUZE/BANDAGES/DRESSINGS) IMPLANT
DRSG OPSITE POSTOP 4X14 (GAUZE/BANDAGES/DRESSINGS) IMPLANT
DRSG OPSITE POSTOP 4X6 (GAUZE/BANDAGES/DRESSINGS) ×4 IMPLANT
DRSG OPSITE POSTOP 4X8 (GAUZE/BANDAGES/DRESSINGS) ×2 IMPLANT
DRSG SURGICEL FIBRILLAR 1X2 (HEMOSTASIS) ×3
DRSG TEGADERM 2-3/8X2-3/4 SM (GAUZE/BANDAGES/DRESSINGS) IMPLANT
DRSG TEGADERM 4X4.75 (GAUZE/BANDAGES/DRESSINGS) IMPLANT
DRSG TEGADERM 6X8 (GAUZE/BANDAGES/DRESSINGS) IMPLANT
DRSG TELFA 3X8 NADH (GAUZE/BANDAGES/DRESSINGS) IMPLANT
DRSG TELFA 4X3 1S NADH ST (GAUZE/BANDAGES/DRESSINGS) IMPLANT
ELECT BLADE 6.5 EXT (BLADE) ×1 IMPLANT
ELECT CAUTERY BLADE 6.4 (BLADE) ×1 IMPLANT
ELECT CAUTERY BLADE TIP 2.5 (TIP) ×6
ELECT EZSTD 165MM 6.5IN (MISCELLANEOUS) ×3
ELECT REM PT RETURN 9FT ADLT (ELECTROSURGICAL) ×6
ELECTRODE CAUTERY BLDE TIP 2.5 (TIP) ×2 IMPLANT
ELECTRODE EZSTD 165MM 6.5IN (MISCELLANEOUS) ×1 IMPLANT
ELECTRODE REM PT RTRN 9FT ADLT (ELECTROSURGICAL) ×3 IMPLANT
FEE INTRAOP MONITOR IMPULS NCS (MISCELLANEOUS) IMPLANT
FORCEPS BPLR BAYO 10IN 1.0TIP (INSTRUMENTS) ×2 IMPLANT
GLOVE BIO SURGEON STRL SZ7 (GLOVE) ×9 IMPLANT
GLOVE BIOGEL PI IND STRL 7.0 (GLOVE) ×2 IMPLANT
GLOVE BIOGEL PI INDICATOR 7.0 (GLOVE) ×4
GLOVE INDICATOR 7.5 STRL GRN (GLOVE) ×3 IMPLANT
GLOVE SURG SYN 7.0 (GLOVE) ×12 IMPLANT
GLOVE SURG SYN 7.0 PF PI (GLOVE) ×4 IMPLANT
GLOVE SURG SYN 8.0 (GLOVE) ×6 IMPLANT
GLOVE SURG SYN 8.0 PF PI (GLOVE) ×2 IMPLANT
GLOVE SURG SYN 8.5  E (GLOVE) ×18
GLOVE SURG SYN 8.5 E (GLOVE) ×6 IMPLANT
GLOVE SURG SYN 8.5 PF PI (GLOVE) ×6 IMPLANT
GOWN SRG XL LVL 3 NONREINFORCE (GOWNS) ×2 IMPLANT
GOWN STRL NON-REIN TWL XL LVL3 (GOWNS) ×6
GOWN STRL REUS W/ TWL LRG LVL3 (GOWN DISPOSABLE) ×2 IMPLANT
GOWN STRL REUS W/ TWL XL LVL3 (GOWN DISPOSABLE) ×5 IMPLANT
GOWN STRL REUS W/TWL LRG LVL3 (GOWN DISPOSABLE) ×6
GOWN STRL REUS W/TWL XL LVL3 (GOWN DISPOSABLE) ×15
GRADUATE 1200CC STRL 31836 (MISCELLANEOUS) ×3 IMPLANT
INTRAOP MONITOR FEE IMPULS NCS (MISCELLANEOUS)
INTRAOP MONITOR FEE IMPULSE (MISCELLANEOUS)
K-WIRE 1.6 NITINOL SHARP TIP (WIRE) ×24
KIT ALARA NEURO ACCESS (KITS) ×2 IMPLANT
KIT INFUSE MEDIUM (Orthopedic Implant) ×2 IMPLANT
KIT SPINAL PRONEVIEW (KITS) ×3 IMPLANT
KIT TURNOVER KIT A (KITS) ×4 IMPLANT
KNIFE BAYONET SHORT DISCETOMY (MISCELLANEOUS) IMPLANT
KWIRE 1.6 NITINOL SHARP TIP (WIRE) ×8 IMPLANT
LABEL OR SOLS (LABEL) ×3 IMPLANT
MARKER SKIN DUAL TIP RULER LAB (MISCELLANEOUS) ×9 IMPLANT
NDL SAFETY ECLIPSE 18X1.5 (NEEDLE) ×1 IMPLANT
NEEDLE HYPO 18GX1.5 SHARP (NEEDLE) ×3
NEEDLE HYPO 22GX1.5 SAFETY (NEEDLE) ×3 IMPLANT
NS IRRIG 1000ML POUR BTL (IV SOLUTION) ×3 IMPLANT
PACK BASIN MAJOR ARMC (MISCELLANEOUS) ×1 IMPLANT
PACK LAMINECTOMY NEURO (CUSTOM PROCEDURE TRAY) ×3 IMPLANT
PACK UNIVERSAL (MISCELLANEOUS) IMPLANT
PAD ARMBOARD 7.5X6 YLW CONV (MISCELLANEOUS) ×9 IMPLANT
PENCIL ELECTRO HAND CTR (MISCELLANEOUS) ×3 IMPLANT
RETAINER VISCERA MED (MISCELLANEOUS) IMPLANT
ROD SPINE CURVE CREO 5.5X110 (Rod) ×4 IMPLANT
ROD TI CVD 5.5X40 (Rod) ×14 IMPLANT
SCREW ANT CERV SD IND 5.5X25 (Screw) ×6 IMPLANT
SCREW CREO MIS 30 TULIP (Screw) ×14 IMPLANT
SPACER HEDRON IA 26X34X15 15D (Spacer) ×2 IMPLANT
SPOGE SURGIFLO 8M (HEMOSTASIS) ×3
SPONGE GAUZE 2X2 8PLY STER LF (GAUZE/BANDAGES/DRESSINGS)
SPONGE GAUZE 2X2 8PLY STRL LF (GAUZE/BANDAGES/DRESSINGS) IMPLANT
SPONGE LAP 18X18 RF (DISPOSABLE) ×3 IMPLANT
SPONGE LAP 18X36 RFD (DISPOSABLE) IMPLANT
SPONGE SURGIFLO 8M (HEMOSTASIS) ×1 IMPLANT
STAPLER SKIN PROX 35W (STAPLE) ×6 IMPLANT
SUT DVC VLOC 3-0 CL 6 P-12 (SUTURE) ×1 IMPLANT
SUT ETHILON 3-0 FS-10 30 BLK (SUTURE)
SUT PDS AB 1 TP1 96 (SUTURE) IMPLANT
SUT PROLENE 5 0 RB 1 DA (SUTURE) ×8 IMPLANT
SUT SILK 2 0 (SUTURE) ×3
SUT SILK 2 0 SH (SUTURE) IMPLANT
SUT SILK 2-0 30XBRD TIE 12 (SUTURE) IMPLANT
SUT SILK 3 0 (SUTURE)
SUT SILK 3 0 REEL (SUTURE) ×2 IMPLANT
SUT SILK 3-0 18XBRD TIE 12 (SUTURE) IMPLANT
SUT SILK 4 0 (SUTURE)
SUT SILK 4-0 18XBRD TIE 12 (SUTURE) IMPLANT
SUT VIC AB 0 CT1 27 (SUTURE) ×6
SUT VIC AB 0 CT1 27XCR 8 STRN (SUTURE) ×2 IMPLANT
SUT VIC AB 2-0 CT1 (SUTURE) ×10 IMPLANT
SUT VIC AB 2-0 CT1 18 (SUTURE) ×7 IMPLANT
SUT VIC AB 3-0 SH 27 (SUTURE) ×6
SUT VIC AB 3-0 SH 27X BRD (SUTURE) IMPLANT
SUT VICRYL+ 3-0 36IN CT-1 (SUTURE) ×6 IMPLANT
SUTURE EHLN 3-0 FS-10 30 BLK (SUTURE) IMPLANT
SYR 20ML LL LF (SYRINGE) IMPLANT
SYR 30ML LL (SYRINGE) ×6 IMPLANT
SYR BULB IRRIG 60ML STRL (SYRINGE) ×1 IMPLANT
TOWEL OR 17X26 4PK STRL BLUE (TOWEL DISPOSABLE) ×9 IMPLANT
TRAY FOLEY MTR SLVR 16FR STAT (SET/KITS/TRAYS/PACK) ×3 IMPLANT
TUBING CONNECTING 10 (TUBING) ×6 IMPLANT
TUBING CONNECTING 10' (TUBING) ×3
insulated  forceps 10.5" ×2 IMPLANT

## 2020-09-30 NOTE — Anesthesia Procedure Notes (Signed)
Arterial Line Insertion Start/End10/27/2021 7:45 AM, 09/30/2020 7:47 AM Performed by: Arita Miss, MD, anesthesiologist  Patient location: OR. Preanesthetic checklist: patient identified, IV checked, site marked, risks and benefits discussed, surgical consent, monitors and equipment checked, pre-op evaluation, timeout performed and anesthesia consent Patient sedated Right, radial was placed Catheter size: 20 Fr Hand hygiene performed  and maximum sterile barriers used   Attempts: 1 Procedure performed without using ultrasound guided technique. Following insertion, dressing applied and Biopatch. Post procedure assessment: normal and unchanged  Patient tolerated the procedure well with no immediate complications.

## 2020-09-30 NOTE — Anesthesia Preprocedure Evaluation (Addendum)
Anesthesia Evaluation  Patient identified by MRN, date of birth, ID band Patient awake    Reviewed: Allergy & Precautions, NPO status , Patient's Chart, lab work & pertinent test results  History of Anesthesia Complications Negative for: history of anesthetic complications  Airway Mallampati: III  TM Distance: >3 FB Neck ROM: Full    Dental no notable dental hx. (+) Teeth Intact   Pulmonary neg sleep apnea, COPD, Patient abstained from smoking.Not current smoker, former smoker,  Supposed to be tested for OSA, no formal diagnosis.  COPD in chart, patient says she is asymptomatic and not on inhalers   Pulmonary exam normal breath sounds clear to auscultation       Cardiovascular Exercise Tolerance: Good METShypertension, (-) CAD and (-) Past MI (-) dysrhythmias  Rhythm:Regular Rate:Normal - Systolic murmurs Non-occlusive b/l carotid disease, followed by vascular   Neuro/Psych negative neurological ROS  negative psych ROS   GI/Hepatic GERD  Controlled,(+)     (-) substance abuse  ,   Endo/Other  neg diabetesHypothyroidism   Renal/GU negative Renal ROS     Musculoskeletal   Abdominal   Peds  Hematology   Anesthesia Other Findings Past Medical History: No date: Arthritis No date: Carotid artery stenosis No date: Colon polyp No date: Degenerative joint disease No date: GERD (gastroesophageal reflux disease) No date: Hyperlipidemia No date: Hypertension No date: Hypothyroidism No date: Osteoporosis No date: Pulmonary emphysema (HCC) No date: Skin cancer     Comment:  lesion removed from nose No date: Spinal stenosis No date: Thyroid disease  Reproductive/Obstetrics                             Anesthesia Physical Anesthesia Plan  ASA: III  Anesthesia Plan: General   Post-op Pain Management:    Induction: Intravenous  PONV Risk Score and Plan: 4 or greater and Ondansetron,  Dexamethasone, TIVA and Propofol infusion  Airway Management Planned: Oral ETT  Additional Equipment: Arterial line  Intra-op Plan:   Post-operative Plan: Extubation in OR  Informed Consent: I have reviewed the patients History and Physical, chart, labs and discussed the procedure including the risks, benefits and alternatives for the proposed anesthesia with the patient or authorized representative who has indicated his/her understanding and acceptance.     Dental advisory given  Plan Discussed with: CRNA and Surgeon  Anesthesia Plan Comments: (Discussed risks of anesthesia with patient, including PONV, sore throat, lip/dental damage, possible blood transfusion. Rare risks discussed as well, such as cardiorespiratory and neurological sequelae. Patient understands.  )       Anesthesia Quick Evaluation

## 2020-09-30 NOTE — Anesthesia Procedure Notes (Signed)
Procedure Name: Intubation Date/Time: 09/30/2020 7:40 AM Performed by: Aline Brochure, CRNA Pre-anesthesia Checklist: Patient identified, Emergency Drugs available, Suction available, Patient being monitored and Timeout performed Patient Re-evaluated:Patient Re-evaluated prior to induction Oxygen Delivery Method: Circle system utilized Preoxygenation: Pre-oxygenation with 100% oxygen Induction Type: IV induction Ventilation: Oral airway inserted - appropriate to patient size and Mask ventilation without difficulty Laryngoscope Size: McGraph and 3 Grade View: Grade II Tube type: Oral Tube size: 6.5 mm Number of attempts: 1 Airway Equipment and Method: Stylet and Video-laryngoscopy Placement Confirmation: ETT inserted through vocal cords under direct vision,  positive ETCO2 and breath sounds checked- equal and bilateral Secured at: 19 cm Tube secured with: Tape Dental Injury: Teeth and Oropharynx as per pre-operative assessment  Difficulty Due To: Difficult Airway- due to anterior larynx

## 2020-09-30 NOTE — Op Note (Signed)
    OPERATIVE NOTE   PROCEDURE: 1. Anterior lumbar interbody fusion exposure via left retroperitoneal approach at the L5-S1 level  PRE-OPERATIVE DIAGNOSIS: Lumbar adjacent segment disease with spondylolisthesis  POST-OPERATIVE DIAGNOSIS: Same  CO-SURGEON: Hortencia Pilar  ANESTHESIA: general  ESTIMATED BLOOD LOSS: 200 cc  FINDING(S): Severe degenerative changes at the L5-S1 disc space  SPECIMEN(S): None for the exposure portion of the surgery  INDICATIONS:   Carol Carey is a 69 y.o. female who presents with need for an anterior spinal L5-S1 interbody fusion. We have been asked to provide the exposure for this procedure. The risks and benefits were reviewed and informed consent has been obtained.  DESCRIPTION: After full informed written consent was obtained from the patient, the patient was brought back to the operating room and placed supine upon the operating table.  Prior to induction, the patient received IV antibiotics.   After obtaining adequate anesthesia, the patient was then prepped and draped in the standard fashion for a anterior lumbar interbody fusion at the L5-S1 level.  A left paramedian incision is created and the dissection is carried down through the rectus sheath exposing the oblique muscles. These are reflected laterally the peritoneum is identified and the preperitoneal space is entered without difficulty. Defects in the peritoneum were repaired with figure-of-eight 3-0 Vicryl suture. The peritoneum and viscera are then reflected medially carrying the dissection down to expose the psoas muscle. At this point the Bookwalter retractor was placed. Once this had been identified including the nerve coursing over the anterior portion of the psoas we moved more medially identifying the ureter and then identifying the external iliac artery. By palpation the anterior spine was identified sacral prominence was located and then the dissection was carried superiorly so that  full exposure of the L5-S1 disc space was obtained. Initially the artery and vein were retracted laterally but this did not provide adequate exposure and we then moved forward with reflecting the iliac artery and vein medially to obtain an adequate field. Any bridging veins that were encountered were ligated and divided with surgical clips and/or silk ties.  Dr. Cari Caraway then scrubbed in and the L5-S1 disc space was verified under fluoroscopy.  At this point I scrubbed out. Later in the case once Dr. Cari Caraway had completed the anterior portion the viscera was returned to its anatomic location. The deep fascia was closed with a running layer of 2-0 Vicryl superficial layers were closed with 2-0 Vicryl followed by 3-0 Vicryl and the skin was closed with staples. Sterile dressing was applied.  Dr. Cari Caraway then moved forward with the posterior portion of the case.   The patient tolerated this procedure well.   COMPLICATIONS: None  CONDITION: [name]  [facility]  Office: [phone]   09/30/2020, 6:21 PM

## 2020-09-30 NOTE — Op Note (Signed)
[facility]   OPERATIVE NOTE  DATE: 09/30/2020  PRE-OPERATIVE DIAGNOSIS: Lumbar adjacent segment disease with spondylolisthesis  POST-OPERATIVE DIAGNOSIS: same as above  PROCEDURE: 1.   Anterior lumbar interbody fusion exposure via left retroperitoneal approach at L5/S1  COSURGEONS: Leotis Pain MD, Hortencia Pilar MD  ASSISTANT(S): none  ANESTHESIA: general  ESTIMATED BLOOD LOSS: 200 cc  FINDING(S): 1.  none   INDICATIONS:   Carol Carey is a 69 y.o. female who presents with need for an anterior spinal L5-S1 interbody fusion.  We are performing the exposure for neurosurgery.  Risks and benefits were discussed and informed consent was obtained.  DESCRIPTION: After obtaining full informed written consent, the patient was brought back to the operating room and placed supine upon the operating table.  The patient was prepped and draped in the standard fashion.  A left paramedian incision was created in the lower abdomen.  Care was taken to stay between the rectus and the obliques.  The fascia was divided in this location.  The peritoneum was retracted medially and 2 small rents in the peritoneum were closed with Vicryl sutures.  We continued to dissect down to exposing the external iliac artery and vein.  The exposure had to be carried superiorly up to the common iliac artery and vein.  Ureter was identified and protected from harm.  Several small venous branches were ligated and divided between silk ties as needed.  We identified the internal iliac artery and vein as well.  We continued the dissection to the proximal common iliac artery and vein and tediously retracted the artery and vein medially.  This exposed the L5-S1 interbody.  The Bookwalter retractor was used as were renal vein retractors and the L5-S1 interbody was exposed to an adequate fashion for fusion by Dr. Izora Ribas. After Dr. Izora Ribas completed his portion of the procedure, we proceeded with  closure.  The deep fascia was closed with a running layer of 2-0 Vicryl.  A more superficial layer was then closed with a running layer of 2-0 Vicryl in the subcutaneous tissue was closed with 3-0 Vicryl.  Skin was coapted with staples.  Sterile dressing was placed. At this point, we turned the case back over to Dr. Cari Caraway for the posterior portion of the procedure.  COMPLICATIONS: None  CONDITION: Stable  Leotis Pain  09/30/2020, 3:29 PM    This note was created with Dragon Medical transcription system. Any errors in dictation are purely unintentional.

## 2020-09-30 NOTE — Progress Notes (Signed)
Brief Pharmacy Note  Consults for vancomycin and cefazolin for surgical prophylaxis. Vancomycin 1000 mg (~15 mg/kg) and cefazolin 2 g (wt < 120 kg) ordered.  Dorena Bodo, PharmD

## 2020-09-30 NOTE — Progress Notes (Signed)
Pt evaluated in PACU. Able to move all extremities with full strength. Drowsy but easily arousable. Will be admitted for postoperative observation.

## 2020-09-30 NOTE — Op Note (Signed)
Indications: Ms. Janco is a 69 yo female who presented with lumbar adjacent segment disease with spondylolisthesis.  She failed conservative management and elected for surgical intervention.  Findings: correction of spondylolisthesis  Preoperative Diagnosis: Lumbar Adjacent Segment disease with Spondylolisthesis Postoperative Diagnosis: same   EBL: 200 ml IVF: 2000 ml Drains: none Disposition: Extubated and Stable to PACU Complications: none  A foley catheter was placed.   Preoperative Note:   Risks of surgery discussed include: infection, bleeding, stroke, coma, death, paralysis, CSF leak, nerve/spinal cord injury, numbness, tingling, weakness, complex regional pain syndrome, recurrent stenosis and/or disc herniation, vascular injury, development of instability, neck/back pain, need for further surgery, persistent symptoms, development of deformity, and the risks of anesthesia. The patient understood these risks and agreed to proceed.  NAME OF ANTERIOR PROCEDURE:               1. Anterior lumbar interbody fusion via a left retroperitoneal approach at L5/S1 with Drs. Dew and Schneir performing approach as co-surgeons 2. Placement of a Lordotic 15 mm height x 26x34 Globus Hedron device at L5-S1, filled with bone morphogenetic protein  NAME OF POSTERIOR PROCEDURE: 1. Posterior instrumentation using Globus Creo  Instrumentation 2. Posterolateral fusion, L5-S1      PROCEDURE:  Patient was brought to the operating room, intubated,  Then positioned in to the supine position with arms extended.  We marked the incision for anterior approach for L5-S1 interbody fusion.  We then prepped and draped.  Drs. Dew and Schneir performed approach.  After they confirmed localization, I scrubbed in for the discectomy and interbody fusion.  While protecting the vasculature and ureter, I opened the L5-S1 disc space.  At this point, we began our discectomy at L5/S1.  The disc was incised  anteriorly throughout the extent of our exposure. Using a combination of pituitary rongeurs, Kerrison rongeurs, rasps, curettes of various sorts, we were able to begin to clean out the disc space.  Once we had cleaned out the majority of the disc space, I confirmed posterior extent of discectomy with flouroscopy.   After this had been performed, we prepared the endplates for placement of our graft, sized a graft to the disc space by serially dilating up in trial sizes until we confirmed that our graft would be well positioned, allowing distraction while maintaining good grip.  This was confirmed under A/P and lateral fluoroscopy in order to ensure its placement as an eventual trial for placement of our final graft.  We irrigated with bacteriostatic saline.  Once confirmed placement, the Hedron implant filled with BMP was impacted into position at L5/S1. The awl was used to make tracts, then screws were placed and secured to keep the graft in place.   Through a combination of intradiscal distraction and anterior releasing, we were able to correct the anterior deformity during disc preparation and placement of the graft.  At this point, final radiographs were performed, and Dr. Lucky Cowboy began closure.  An xray was performed to confirm no foreign bodies were left.  This was confirmed by the attending radiologist.    Dr. Lucky Cowboy then finished closure, and the wound was dressed.    After closing the anterior part in layers, the patient was repositioned into prone position.  All pressure points were checked and double-checked and we brought in fluoroscopy to confirm our approach angles for putting in percutaneous pedicle screws.  The prior incision was identified.  We then prepped and draped the patient in the standard fashion.  At  this point, incisions were made along the prior incisions.  The prior implants were identified and removed, with K wires placed into the prior tracts L3-5.  At S1, a Jamsheedi needle was  used to cannulate the pedicle bilaterally using AP flouroscopy. The pedicles were cannulated on AP and en fosse views, and confirmed in lateral position.  We then placed 7.5x40 mm screws at L3-S1 except left L5, which did not have good purchase.  Globus Creo implants were used.  Once the screws were placed, the screw extensions were then linked, a path was formed for the rod and a rod was utilized to connect the screws.  We then compressed, torqued / counter-torqued and removed the screw assembly. Once performed on each side, confirmatory AP and lateral x-rays were taken and the case was completed.   Again we confirmed radiographically and began our closure.  The wound was closed using 0 Vicryl interrupted suture in the fascia, 2-0 Vicryl inverted suture were placed in the subcutaneous tissue and dermis. 3-0 monocryl was used for final closure. Dermabond was used to close the skin.    Needle, lap and all counts were correct at the end of the case.     Lonell Face NP assisted in the entire procedure.  Meade Maw MD Neurosurgery  Drs. Dew and Schneir performed approach and will dictate as separate note as co-surgeon and Environmental consultant for approach.

## 2020-09-30 NOTE — Transfer of Care (Signed)
Immediate Anesthesia Transfer of Care Note  Patient: Carol Carey  Procedure(s) Performed: L5-S1 ANTERIOR LUMBAR INTERBODY FUSION, L3-S1 INSTRUMENTATION (N/A ) ABDOMINAL EXPOSURE (N/A )  Patient Location: PACU  Anesthesia Type:General  Level of Consciousness: drowsy  Airway & Oxygen Therapy: Patient connected to face mask oxygen  Post-op Assessment: Post -op Vital signs reviewed and stable  Post vital signs: stable  Last Vitals:  Vitals Value Taken Time  BP 119/69 09/30/20 1323  Temp    Pulse 69 09/30/20 1325  Resp 24 09/30/20 1325  SpO2 100 % 09/30/20 1325  Vitals shown include unvalidated device data.  Last Pain:  Vitals:   09/30/20 0653  TempSrc: Oral  PainSc: 5          Complications: No complications documented.

## 2020-09-30 NOTE — H&P (Signed)
History of Present Illness: 09/30/2020 Carol Carey presents today for surgical intervention with continued symptoms of left leg pain and numbness.  08/13/2020  Carol Carey returns to see me. She has tried physical therapy for 6 visits and been doing physical therapy since I last saw her 2 months ago. She has not had any improvement with injections. She continues to have severe pain.  06/04/2020 Carol Carey is here today with a chief complaint of low back pain as well as pain/tingling that radiates to her left buttock and down the lateral left leg to her lateral foot.  Has been having pain for about 3 years. This pain is sharp and goes down the back of her left leg and prickly impacts her while she is walking and standing and sometimes while she is sitting. Sitting down or laying down sometimes helps. Her medications sometimes help. She denies weakness. She denies bowel or bladder dysfunction.  Of note, she did have surgery in 2010 and 2013. In 2013, she had a lateral L3-4 adjacent segment surgery with a plate. She developed implant failure, so went back for revision of her posterior instrumentation. This was a very successful surgery.  She can only walk approximately 50 feet before having pain.  Conservative measures:  Physical therapy: has not participated recently (previously participated in 2018) Multimodal medical therapy including regular antiinflammatories: cyclobenzaprine, diclofenac, pregabalin, tramadol, tylenol Injections: has received epidural steroid injections 05/22/2020: Left L5-S1 and left S1 transforaminal ESI (minimal relief)  Past Surgery: lumbar fusion in 2010 by Dr March Rummage, lumbar decompression and fusion x 2 in 2013 by Dr Carrolyn Leigh has no symptoms of cervical myelopathy.  The symptoms are causing a significant impact on the patient's life.   Review of Systems:  A 10 point review of systems is negative, except for the pertinent positives and negatives  detailed in the HPI.  Past Medical History: Past Medical History:  Diagnosis Date  . Allergy 10 years ago  seasonal  . Anxiety  . Carotid atherosclerosis  Following with Vascular (Dr Lucky Cowboy)  . Colon polyp  . Degenerative joint disease  . GERD (gastroesophageal reflux disease)  . Hypertension  . Hypothyroid  . Mixed hyperlipidemia  . Osteoporosis  . Pulmonary emphysema (CMS-HCC)  . Skin cancer  . Sleep apnea  . Spinal stenosis   Past Surgical History: Past Surgical History:  Procedure Laterality Date  . APPENDECTOMY 1974  . BACK SURGERY 06/06/2012  Lumbar XLIF-L3  . CESAREAN SECTION 1993  . COLONOSCOPY 10/05/2007, 04/25/2003  Dr. Annamary Rummage @ [facility] - Hyperplastic Polyps  . COLONOSCOPY 2014  . COLONOSCOPY 06/19/2018  PH Adenomatous Polyps: CBF 06/2023  . EGD 06/19/2018  Gastritis; Harmless Polyp (Fundic type) No repeat per RTE  . FRACTURE SURGERY First back surgery 2010  . HYSTERECTOMY 1993  . INCISION TENDON SHEATH FOR TRIGGER FINGER  x 2  . INSTRUMENTATION POSTERIOR SPINE 3 TO 6 VERTEBRAL SEGMENTS Bilateral 06/25/2012  Procedure: INSTRUMENTATION POSTERIOR SPINE 3 TO 6 VERTEBRAL SEGMENTS; Surgeon: Donnamarie Rossetti, MD; Location: [employer]; Service: Neurosurgery; Laterality: Bilateral;  . LAMINECTOMY POSTERIOR LUMBAR FACETECTOMY & FORAMINOTOMY W/DECOMP Bilateral 06/25/2012  Procedure: LAMINECTOMY POSTERIOR LUMBAR FACETECTOMY & FORAMINOTOMY W/DECOMP Set up like Unilateral MITLIF; Remove Sextant; Extend Spherix Left L3-L4; Surgeon: Donnamarie Rossetti, MD; Location: [place]; Service: Neurosurgery; Laterality: Bilateral; Glennon Mac Spine Table/Carm/All MD Instruments/MD [facility] Spotlight light source and light cable/Wallie-Hernig/Isaacs Curettes/Syperts/  . LAPAROSCOPIC TUBAL LIGATION 1974  . LUMB SPINE FUSION COMBINED Bilateral 06/25/2012  Procedure: LUMB SPINE FUSION COMBINED; Surgeon: Donnamarie Rossetti, MD; Location: [place]; Service: Neurosurgery;  Laterality: Bilateral;  . POSTERIOR FUSION LUMBAR SPINE 03/30/2009  . REMOVAL POSTERIOR SEGMENTAL LUMBAR/THORACIC SPINAL HARDWARE Bilateral 06/25/2012  Procedure: REMOVAL POSTERIOR SEGMENTAL LUMBAR/THORACIC SPINAL HARDWARE; Surgeon: Donnamarie Rossetti, MD; Location: [place]; Service: Neurosurgery; Laterality: Bilateral;  . TONSILLECTOMY  . TUBAL LIGATION 1974    Allergies  Allergen Reactions  . Demerol [Meperidine Hcl] Nausea And Vomiting    Vomiting lasted for 2 days after taking med  . Lisinopril Cough  . Latex Rash  . Penicillins Rash  . Sulfa Antibiotics Rash    Current Meds  Medication Sig  . amLODipine (NORVASC) 5 MG tablet Take 5 mg by mouth daily.   Marland Kitchen atenolol (TENORMIN) 50 MG tablet Take 50 mg by mouth at bedtime.   Marland Kitchen atorvastatin (LIPITOR) 20 MG tablet Take 20 mg by mouth daily.   Marland Kitchen buPROPion (WELLBUTRIN XL) 150 MG 24 hr tablet Take 150 mg by mouth daily.  Marland Kitchen denosumab (PROLIA) 60 MG/ML SOSY injection Inject 60 mg into the skin every 6 (six) months.  . esomeprazole (NEXIUM) 20 MG capsule Take 20 mg by mouth daily.   . hydrochlorothiazide (HYDRODIURIL) 25 MG tablet Take 12.5 mg by mouth daily.   Marland Kitchen levothyroxine (SYNTHROID) 112 MCG tablet Take 112 mcg by mouth at bedtime.   Marland Kitchen losartan (COZAAR) 100 MG tablet Take 100 mg by mouth daily.   . meloxicam (MOBIC) 15 MG tablet Take 15 mg by mouth daily as needed.   . methocarbamol (ROBAXIN) 500 MG tablet Take 500 mg by mouth every 8 (eight) hours as needed for muscle spasms.  . montelukast (SINGULAIR) 10 MG tablet Take 10 mg by mouth at bedtime.   . pregabalin (LYRICA) 75 MG capsule Take 75 mg by mouth daily.     Social History: Social History   Tobacco Use  . Smoking status: Former Smoker  Packs/day: 0.50  Years: 40.00  Pack years: 20.00  Types: Cigarettes  Quit date: 02/03/2020  Years since quitting: 0.5  . Smokeless tobacco: Never Used  Vaping Use  . Vaping Use: Never used  Substance Use Topics  . Alcohol use:  Yes  Alcohol/week: 7.0 standard drinks  Types: 7 Glasses of wine per week  Comment: wine  . Drug use: No   Family Medical History: Family History  Problem Relation Age of Onset  . Myocardial Infarction (Heart attack) Father  . Microcephaly Father  . High blood pressure (Hypertension) Father  . Lung cancer Brother  . Skin cancer Brother  . Cancer Brother  . High blood pressure (Hypertension) Mother  . High blood pressure (Hypertension) Son  . Stroke Maternal Grandmother  . Stroke Maternal Grandfather  . Tuberculosis Paternal Grandmother  COD  . Myocardial Infarction (Heart attack) Paternal Grandfather  COD  . High blood pressure (Hypertension) Daughter  . ADD / ADHD Daughter  . Diabetes Son  . High blood pressure (Hypertension) Son  . Neuropathy Son  . Thyroid disease Son   Physical Examination:  Vitals:   09/30/20 0653  BP: (!) 146/61  Pulse: 64  Resp: 14  Temp: 98.4 F (36.9 C)  SpO2: 98%    General: Patient is well developed, well nourished, calm, collected, and in no apparent distress. Attention to examination is appropriate.  Psychiatric: Patient is non-anxious.  Head: Pupils equal, round, and reactive to light.  ENT: Oral mucosa appears well hydrated.  Neck: Supple. Full range of motion.  Respiratory: Patient is breathing without any difficulty.  Extremities: No edema.  Vascular: Palpable dorsal pedal pulses.  Skin: On exposed skin, there are no abnormal skin lesions.  Heart sounds normal no MRG. Chest Clear to Auscultation Bilaterally.   NEUROLOGICAL:   Awake, alert, oriented to person, place, and time. Speech is clear and fluent. Fund of knowledge is appropriate.   Cranial Nerves: Pupils equal round and reactive to light. Facial tone is symmetric. Facial sensation is symmetric. Shoulder shrug is symmetric. Tongue protrusion is midline. There is no pronator drift.  ROM of spine: full. Palpation of spine: non tender.   Strength: Side Biceps  Triceps Deltoid Interossei Grip Wrist Ext. Wrist Flex.  R 5 5 5 5 5 5 5   L 5 5 5 5 5 5 5    Side Iliopsoas Quads Hamstring PF DF EHL  R 5 5 5 5 5 5   L 5 5 5 5 5 5    Reflexes are 1+ and symmetric at the biceps, triceps, brachioradialis, patella and achilles. Hoffman's is absent. Clonus is not present. Toes are down-going.  Bilateral upper and lower extremity sensation is intact to light touch.  Gait is antalgic. No evidence of dysmetria noted.  Medical Decision Making  Imaging: MRI L spine 04/06/2020  IMPRESSION:  1. Moderate to severeleft neural foraminal narrowing at L5-S1  secondary to combination of disc bulge/uncovering and prominent  facet arthrosis.  2. Status post interbody fusion and posterior transpedicular  fixation at L3-L4-L5 with L4-5 laminectomy and L4-5 laminectomy  noting T1 hypointense tissue surrounding the thecal sac and  extending into the left subarticular zone at this level, which may  represent postsurgical fibrosis.   Electronically Signed  By: Pedro Earls M.D.  On: 04/06/2020 12:44  Reviewed her x-rays from 2021 and from 2018. In that interval, she has developed new anterolisthesis of L5 on S1 secondary to adjacent segment disease.  I have personally reviewed the images and agree with the above interpretation.  CT L spine 06/12/2020 IMPRESSION:  Solid union from L3 throughL5. Sufficient patency of the canal and  foramina in that segment.   At L2-3, there is mild bulging of the disc. There is bilateral facet  and ligamentous hypertrophy. 4 mm synovial cyst projecting inward  from the facet on the left results in left lateral recess narrowing  that could possibly affect the left L3 nerve.   At L5-S1, there is advanced bilateral facet arthropathy allowing 4  mm of anterolisthesis in this position. This would likely worsen  with standing or flexion based on the morphologyof the facet  arthropathy. There is degeneration and  bulging of the disc. Stenosis  of the subarticular lateral recesses and neural foramina could cause  neural compression, particularly if anterolisthesis worsens.   Electronically Signed  ByUlice Dash M.D.  On: 06/12/2020 21:45  Assessment and Plan: Carol Carey is a pleasant 69 y.o. female with adjacent segment disease with spondylolisthesis at L5-S1. She has severe facet arthrosis at L5-S1. This causes severe left neuroforaminal narrowing at L5-S1.  She has now tried physical therapy as well as injections. We obtained a DEXA study which shows that she has appropriate bone density. I described various different approaches. We will proceed with L5-S1 anterior lumbar interbody fusion followed by percutaneous fixation L3-S1 for posterior fusion L5-S1.    Meade Maw MD, [facility] Department of Neurosurgery

## 2020-10-01 ENCOUNTER — Inpatient Hospital Stay: Payer: Medicare Other

## 2020-10-01 MED ORDER — SENNOSIDES-DOCUSATE SODIUM 8.6-50 MG PO TABS
ORAL_TABLET | ORAL | Status: AC
  Filled 2020-10-01: qty 1

## 2020-10-01 NOTE — Progress Notes (Signed)
Procedure: Anterior lumbar interbody fusion via a left retroperitoneal approach at L5/S1, posterior spinal fusion Procedure date: 09/30/2020 Diagnosis: lumbar radiculopathy    History: Carol Carey is s/p ALIF at L5-S1 POD1: Carol Carey is doing well this morning although she does endorse moderate lower back pain. She reports that her left leg pain has improved. Continues to endorse left foot "numbness", no change since prior to surgery Physical Exam: Vitals:   10/01/20 0728 10/01/20 1216  BP: (!) 150/75 (!) 110/56  Pulse: 91 77  Resp: 18 17  Temp: 97.8 F (36.6 C) 97.6 F (36.4 C)  SpO2: 95% 98%    General: Alert and oriented, sitting in bed Strength:5/5 throughout  Sensation: intact and symmetric with exception of preexisting left foot decreased sensation Skin: Incisions clean, dry, intact  Data:  Recent Labs  Lab 09/30/20 0633  NA 133*  K 3.4*  CL 94*  BUN 11  CREATININE 1.00  GLUCOSE 114*   No results for input(s): AST, ALT, ALKPHOS in the last 168 hours.  Invalid input(s): TBILI   Recent Labs  Lab 09/30/20 0633  HGB 11.2*  HCT 33.0*   No results for input(s): APTT, INR in the last 168 hours.       Assessment/Plan:  Carol Carey is POD1 s/p L5-S1 ALIF. Will continue to monitor her overnight.   #L5-S1 ALIF - mobilize - pain control - PTOT - Brace - Imaging - Aggressive bowel regimen, will advance diet as tolerated, increase to soft diet today  #DVT Prophylaxis - DVT prophylaxis Lovenox 40mg  QD  #HTN - Continue home antihypertensives with hold parameters   Lonell Face, NP Department of Neurosurgery

## 2020-10-01 NOTE — Progress Notes (Signed)
Physical Therapy Treatment Patient Details Name: Carol Carey MRN: [id number] DOB: Jul 01, 1951 Today's Date: 10/01/2020    History of Present Illness Pt is a 69 y.o. female s/p 10/27 anterior lumbar interbody fusion exposure via L retroperitoneal approach at L5-S1 secondary lumbar adjacent segment disease with spondylolisthesis; posterolateral fusion L5-S1.  Pt with LBP and pain/tingling radiating to L buttock and down lateral L leg to lateral foot prior to surgery.  PMH includes anxiety, htn, sleep apnea, spinal stenosis, pulmonary emphysema, posterior fusion lumbar spine 2010, lumbar XLIF-L3 (2013).    PT Comments    Pt resting in bed upon PT arrival; husband present.  Reviewed spinal precautions (pt able to state 2/3).  SBA supine to sitting via logrolling towards L side.  Pt requiring vc's for donning lumbar brace sitting edge of bed.  CGA with transfers with RW.  CGA with ambulation 110 feet x2 with RW (sitting rest break between ambulation trials d/t LBP).  Pt reporting 3-4/10 LBP beginning/end of session and 4/10 with ambulation (pt pre-medicated with pain meds for therapy session).  Will continue to focus on strengthening, increasing ambulation distance, and trial stairs next session.    Follow Up Recommendations  Home health PT     Equipment Recommendations  Rolling walker with 5" wheels;3in1 (PT) (youth sized)    Recommendations for Other Services OT consult     Precautions / Restrictions Precautions Precautions: Fall;Back Precaution Comments: No bending/twisting/arching; lumbar brace on when OOB Required Braces or Orthoses: Spinal Brace Spinal Brace: Lumbar corset;Applied in sitting position Restrictions Weight Bearing Restrictions: No    Mobility  Bed Mobility Overal bed mobility: Needs Assistance Bed Mobility: Rolling;Sit to Supine Rolling: Supervision Sidelying to sit: Supervision     General bed mobility comments: mild increased effort to perform on own; no  vc's for logrolling required  Transfers Overall transfer level: Needs assistance Equipment used: Rolling walker (2 wheeled) Transfers: Sit to/from [facility] Sit to Stand: Min guard Stand pivot transfers: Min guard       General transfer comment: vc's for UE placement for transfers  Ambulation/Gait Ambulation/Gait assistance: Min guard Gait Distance (Feet):  (110 feet x2) Assistive device: Rolling walker (2 wheeled)   Gait velocity: decreased   General Gait Details: partial step through gait pattern; steady with RW; limited distance d/t LBP   Stairs             Wheelchair Mobility    Modified Rankin (Stroke Patients Only)       Balance Overall balance assessment: Needs assistance Sitting-balance support: No upper extremity supported;Feet supported Sitting balance-Leahy Scale: Good Sitting balance - Comments: steady sitting reaching within BOS   Standing balance support: During functional activity Standing balance-Leahy Scale: Good Standing balance comment: steady ambulating with B UE support on RW                            Cognition Arousal/Alertness: Awake/alert Behavior During Therapy: WFL for tasks assessed/performed Overall Cognitive Status: Within Functional Limits for tasks assessed                                        Exercises      General Comments General comments (skin integrity, edema, etc.): mild drainage noted from dressings low back and L abdomen      Pertinent Vitals/Pain Pain Assessment: 0-10 Pain Score: 4  Pain Location: low back Pain Descriptors / Indicators: Aching;Discomfort;Constant;Operative site guarding;Sore Pain Intervention(s): Limited activity within patient's tolerance;Monitored during session;Premedicated before session;Repositioned (RN notified)  Vitals (HR and O2 on room air) stable and WFL throughout treatment session.    Home Living                       Prior Function            PT Goals (current goals can now be found in the care plan section) Acute Rehab PT Goals Patient Stated Goal: to improve pain and mobility PT Goal Formulation: With patient Time For Goal Achievement: 10/15/20 Potential to Achieve Goals: Good Progress towards PT goals: Progressing toward goals    Frequency    BID      PT Plan Current plan remains appropriate    Co-evaluation              AM-PAC PT "6 Clicks" Mobility   Outcome Measure  Help needed turning from your back to your side while in a flat bed without using bedrails?: A Little Help needed moving from lying on your back to sitting on the side of a flat bed without using bedrails?: A Little Help needed moving to and from a bed to a chair (including a wheelchair)?: A Little Help needed standing up from a chair using your arms (e.g., wheelchair or bedside chair)?: A Little Help needed to walk in hospital room?: A Little Help needed climbing 3-5 steps with a railing? : A Little 6 Click Score: 18    End of Session Equipment Utilized During Treatment: Gait belt;Back brace (back brace donned sitting edge of bed with vc's for technique) Activity Tolerance: Patient limited by pain Patient left: in chair;with call bell/phone within reach;with chair alarm set;with family/visitor present;with SCD's reapplied;Other (comment) (B heels floating via pillow support) Nurse Communication: Mobility status;Precautions;Other (comment) (pt's pain status and dizziness) PT Visit Diagnosis: Other abnormalities of gait and mobility (R26.89);Muscle weakness (generalized) (M62.81);Difficulty in walking, not elsewhere classified (R26.2);Pain Pain - part of body:  (low back and R hip)     Time: 0981-1914 PT Time Calculation (min) (ACUTE ONLY): 27 min  Charges:  $Gait Training: 8-22 mins $Therapeutic Activity: 8-22 mins                    Leitha Bleak, PT 10/01/20, 2:50 PM

## 2020-10-01 NOTE — Evaluation (Signed)
Physical Therapy Evaluation Patient Details Name: Carol Carey MRN: [id number] DOB: [birth year] Today's Date: 10/01/2020   History of Present Illness  Pt is a 69 y.o. female s/p 10/27 anterior lumbar interbody fusion exposure via L retroperitoneal approach at L5-S1 secondary lumbar adjacent segment disease with spondylolisthesis; posterolateral fusion L5-S1.  Pt with LBP and pain/tingling radiating to L buttock and down lateral L leg to lateral foot prior to surgery.  PMH includes anxiety, htn, sleep apnea, spinal stenosis, pulmonary emphysema, posterior fusion lumbar spine 2010, lumbar XLIF-L3 (2013).  Clinical Impression  Prior to hospital admission, pt was independent with ambulation; lives with husband in 1 level home with 5-6 STE with L railing.  Currently pt is min assist with bed mobility via logrolling technique; CGA to min assist with transfers using RW; and CGA to ambulate 10 feet with RW.  Pain 3/10 low back at rest beginning/end of session but increased to 9-10/10 with ambulation limiting distance pt able to ambulate (distance also limited d/t pt c/o dizziness with standing activities--BP 117/66 sitting in recliner but then symptoms resolved sitting in recliner--nurse notified).  Pt requiring cueing for donning brace and spinal precautions during session.  Pt would benefit from skilled PT to address noted impairments and functional limitations (see below for any additional details).  Upon hospital discharge, pt would benefit from [place].    Follow Up Recommendations Home health PT    Equipment Recommendations  Rolling walker with 5" wheels;3in1 (PT) (youth sized)    Recommendations for Other Services OT consult     Precautions / Restrictions Precautions Precautions: Fall;Back Precaution Comments: No bending/twisting/arching; lumbar brace on when OOB Required Braces or Orthoses: Spinal Brace Spinal Brace: Lumbar corset;Applied in sitting position ((donn sitting EOB per verbal  discussion with MD Izora Ribas 10/28)) Restrictions Weight Bearing Restrictions: No      Mobility  Bed Mobility Overal bed mobility: Needs Assistance Bed Mobility: Rolling;Sidelying to Sit Rolling: Min guard (vc's for technique; logrolling towards R side) Sidelying to sit: Min assist       General bed mobility comments: vc's for logrolling technique and back precautions; assist for trunk    Transfers Overall transfer level: Needs assistance Equipment used: Rolling walker (2 wheeled) Transfers: Sit to/from [facility] Sit to Stand: Min assist;Min guard (min assist to stand from bed; CGA to stand from recliner) Stand pivot transfers: Min guard (stand step turn bed to recliner with RW)       General transfer comment: vc's for UE placement for transfers  Ambulation/Gait Ambulation/Gait assistance: Min guard Gait Distance (Feet): 10 Feet Assistive device: Rolling walker (2 wheeled)   Gait velocity: decreased   General Gait Details: partial step through gait pattern; steady with RW; limited distance d/t dizziness and LBP  Stairs            Wheelchair Mobility    Modified Rankin (Stroke Patients Only)       Balance Overall balance assessment: Needs assistance Sitting-balance support: No upper extremity supported;Feet supported Sitting balance-Leahy Scale: Good Sitting balance - Comments: steady sitting reaching within BOS   Standing balance support: No upper extremity supported Standing balance-Leahy Scale: Fair Standing balance comment: steady static standing                             Pertinent Vitals/Pain Pain Assessment: 0-10 Pain Score: 3  (9-10/10 with ambulation; 3/10 at rest beginning/end of session) Pain Location: low back Pain Descriptors / Indicators:  Aching;Discomfort;Constant;Operative site guarding;Sore Pain Intervention(s): Limited activity within patient's tolerance;Monitored during session;Premedicated before  session;Repositioned  Vitals (HR and O2 on room air) stable and WFL throughout treatment session.    Home Living Family/patient expects to be discharged to:: Private residence Living Arrangements: Spouse/significant other Available Help at Discharge: Family Type of Home: House Home Access: Stairs to enter Entrance Stairs-Rails: Left Entrance Stairs-Number of Steps: 5-6 Home Layout: One level Home Equipment: Grab bars - tub/shower      Prior Function Level of Independence: Independent         Comments: Pt reports no falls in past 6 months.     Hand Dominance        Extremity/Trunk Assessment   Upper Extremity Assessment Upper Extremity Assessment: Defer to OT evaluation    Lower Extremity Assessment Lower Extremity Assessment:  (at least 3/5 B LE AROM hip flexion, knee flexion/extension, and DF/PF; pt reporting numbness 2nd toe L foot (had prior to surgery))    Cervical / Trunk Assessment Cervical / Trunk Assessment: Normal  Communication   Communication: No difficulties  Cognition Arousal/Alertness: Awake/alert Behavior During Therapy: WFL for tasks assessed/performed Overall Cognitive Status: Within Functional Limits for tasks assessed                                        General Comments General comments (skin integrity, edema, etc.): mild drainage noted from dressings low back.  Nursing cleared pt for participation in physical therapy.  Pt agreeable to PT session.  Pt's husband present during session.    Exercises  Pt and pt's husband educated on spinal precautions with activities and back brace use (including donning and adjusting for fit): pt would benefit from review   Assessment/Plan    PT Assessment Patient needs continued PT services  PT Problem List Decreased strength;Decreased activity tolerance;Decreased balance;Decreased mobility;Decreased knowledge of use of DME;Decreased knowledge of precautions;Decreased skin  integrity;Pain       PT Treatment Interventions DME instruction;Gait training;Stair training;Functional mobility training;Therapeutic activities;Therapeutic exercise;Balance training;Patient/family education    PT Goals (Current goals can be found in the Care Plan section)  Acute Rehab PT Goals Patient Stated Goal: to improve pain and mobility PT Goal Formulation: With patient Time For Goal Achievement: 10/15/20 Potential to Achieve Goals: Good    Frequency BID   Barriers to discharge        Co-evaluation               AM-PAC PT "6 Clicks" Mobility  Outcome Measure Help needed turning from your back to your side while in a flat bed without using bedrails?: A Little Help needed moving from lying on your back to sitting on the side of a flat bed without using bedrails?: A Little Help needed moving to and from a bed to a chair (including a wheelchair)?: A Little Help needed standing up from a chair using your arms (e.g., wheelchair or bedside chair)?: A Little Help needed to walk in hospital room?: A Little Help needed climbing 3-5 steps with a railing? : A Little 6 Click Score: 18    End of Session Equipment Utilized During Treatment: Gait belt;Back brace (back brace donned sitting EOB with vc's and assist for technique) Activity Tolerance: Patient limited by pain;Other (comment) (Limited d/t dizziness with ambulation) Patient left: in chair;with call bell/phone within reach;with chair alarm set;with family/visitor present;with SCD's reapplied;Other (comment) (B heels floating via  pillow support) Nurse Communication: Mobility status;Precautions;Other (comment) (pt's pain status and dizziness (and BP)) PT Visit Diagnosis: Other abnormalities of gait and mobility (R26.89);Muscle weakness (generalized) (M62.81);Difficulty in walking, not elsewhere classified (R26.2);Pain Pain - part of body:  (low back)    Time: 7614-7092 PT Time Calculation (min) (ACUTE ONLY): 42  min   Charges:   PT Evaluation $PT Eval Low Complexity: 1 Low PT Treatments $Therapeutic Activity: 23-37 mins       Revecca Nachtigal, PT 10/01/20, 10:03 AM

## 2020-10-01 NOTE — Anesthesia Postprocedure Evaluation (Signed)
Anesthesia Post Note  Patient: Carol Carey  Procedure(s) Performed: L5-S1 ANTERIOR LUMBAR INTERBODY FUSION, L3-S1 INSTRUMENTATION (N/A ) ABDOMINAL EXPOSURE (N/A )  Patient location during evaluation: PACU Anesthesia Type: General Level of consciousness: awake and alert Pain management: pain level controlled Vital Signs Assessment: post-procedure vital signs reviewed and stable Respiratory status: spontaneous breathing, nonlabored ventilation and respiratory function stable Cardiovascular status: blood pressure returned to baseline and stable Postop Assessment: no apparent nausea or vomiting Anesthetic complications: no   No complications documented.   Last Vitals:  Vitals:   10/01/20 0416 10/01/20 0728  BP: (!) 145/69 (!) 150/75  Pulse: 87 91  Resp: 16 18  Temp: 36.8 C 36.6 C  SpO2: 97% 95%    Last Pain:  Vitals:   10/01/20 0728  TempSrc: Oral  PainSc:                  Tera Mater

## 2020-10-01 NOTE — Evaluation (Signed)
Occupational Therapy Evaluation Patient Details Name: Carol Carey MRN: [id number] DOB: [birth year] Today's Date: 10/01/2020    History of Present Illness Pt is a 69 y.o. female s/p 10/27 anterior lumbar interbody fusion exposure via L retroperitoneal approach at L5-S1 secondary lumbar adjacent segment disease with spondylolisthesis; posterolateral fusion L5-S1.  Pt with LBP and pain/tingling radiating to L buttock and down lateral L leg to lateral foot prior to surgery.  PMH includes anxiety, htn, sleep apnea, spinal stenosis, pulmonary emphysema, posterior fusion lumbar spine 2010, lumbar XLIF-L3 (2013).   Clinical Impression   Patient presenting with decreased I with self care, balance, and functional mobility/transfers, safety awareness, strength, and decreased knowledge of back precautions. Patient reports being mod I PTA with modifications as needed secondary to pain. She lives with her husband. Patient currently functioning at min A overall with use of AD and min cuing for back precautions. Patient will benefit from acute OT to increase overall independence in the areas of ADLs, functional mobility, and safety awareness in order to safely discharge home with caregiver.    Follow Up Recommendations  Home health OT;Supervision - Intermittent    Equipment Recommendations  3 in 1 bedside commode       Precautions / Restrictions Precautions Precautions: Fall;Back Precaution Comments: No bending/twisting/arching; lumbar brace on when OOB Required Braces or Orthoses: Spinal Brace Spinal Brace: Lumbar corset;Applied in sitting position Restrictions Weight Bearing Restrictions: No      Mobility Bed Mobility Overal bed mobility: Needs Assistance Bed Mobility: Rolling;Sit to Supine Rolling: Min assist Sidelying to sit: Min assist   Sit to supine: Mod assist   General bed mobility comments: mod A for B LEs with cuing for log roll technique    Transfers Overall transfer level:  Needs assistance Equipment used: Rolling walker (2 wheeled) Transfers: Sit to/from [facility] Sit to Stand: Min assist;Min guard Stand pivot transfers: Min guard       General transfer comment: vc's for UE placement for transfers    Balance Overall balance assessment: Needs assistance Sitting-balance support: No upper extremity supported;Feet supported Sitting balance-Leahy Scale: Good Sitting balance - Comments: steady sitting reaching within BOS   Standing balance support: During functional activity Standing balance-Leahy Scale: Fair Standing balance comment: steady static standing          ADL either performed or assessed with clinical judgement   ADL Overall ADL's : Needs assistance/impaired        Toilet Transfer: Minimal assistance;BSC;RW;Ambulation   Toileting- Clothing Manipulation and Hygiene: Minimal assistance;Sit to/from stand         General ADL Comments: Ot reviewed back precautions related to self care tasks. Pt demonstrating figure four position in order to increase I with LB clothing management. Pt standing with min A and transferring to [facility] for toileting needs with min A for for balance with hygiene and clothing management.     Vision Patient Visual Report: No change from baseline              Pertinent Vitals/Pain Pain Assessment: 0-10 Pain Score: 4  Pain Location: low back Pain Descriptors / Indicators: Aching;Discomfort;Constant;Operative site guarding;Sore Pain Intervention(s): Limited activity within patient's tolerance;Monitored during session;Repositioned        Extremity/Trunk Assessment Upper Extremity Assessment Upper Extremity Assessment: Overall WFL for tasks assessed   Lower Extremity Assessment Lower Extremity Assessment: Defer to PT evaluation   Cervical / Trunk Assessment Cervical / Trunk Assessment: Normal   Communication Communication Communication: No difficulties   Cognition  Arousal/Alertness:  Awake/alert Behavior During Therapy: WFL for tasks assessed/performed Overall Cognitive Status: Within Functional Limits for tasks assessed          General Comments  mild drainage noted from dressings low back            Home Living Family/patient expects to be discharged to:: Private residence Living Arrangements: Spouse/significant other Available Help at Discharge: Family Type of Home: House Home Access: Stairs to enter Technical brewer of Steps: 5-6 Entrance Stairs-Rails: Left Home Layout: One level     Bathroom Shower/Tub: Teacher, early years/pre: Standard     Home Equipment: Grab bars - tub/shower          Prior Functioning/Environment Level of Independence: Independent        Comments: Pt reports no falls in past 6 months.        OT Problem List: Impaired balance (sitting and/or standing);Decreased safety awareness;Decreased knowledge of use of DME or AE;Decreased coordination;Decreased activity tolerance;Pain      OT Treatment/Interventions: Self-care/ADL training;Therapeutic exercise;Patient/family education;Balance training;Therapeutic activities;Energy conservation;DME and/or AE instruction    OT Goals(Current goals can be found in the care plan section) Acute Rehab OT Goals Patient Stated Goal: to improve pain and mobility OT Goal Formulation: With patient/family Time For Goal Achievement: 10/15/20 Potential to Achieve Goals: Good ADL Goals Pt Will Perform Grooming: with modified independence;standing Pt Will Perform Lower Body Dressing: with modified independence;sit to/from stand Pt Will Transfer to Toilet: with modified independence;stand pivot transfer Pt Will Perform Toileting - Clothing Manipulation and hygiene: with modified independence;sit to/from stand  OT Frequency: Min 1X/week   Barriers to D/C:               AM-PAC OT "6 Clicks" Daily Activity     Outcome Measure Help from another person eating meals?:  None Help from another person taking care of personal grooming?: A Little Help from another person toileting, which includes using toliet, bedpan, or urinal?: A Little Help from another person bathing (including washing, rinsing, drying)?: A Little Help from another person to put on and taking off regular upper body clothing?: A Little Help from another person to put on and taking off regular lower body clothing?: A Little 6 Click Score: 19   End of Session Equipment Utilized During Treatment: Rolling walker Nurse Communication: Mobility status;Precautions  Activity Tolerance: Patient tolerated treatment well Patient left: in bed;with call bell/phone within reach  OT Visit Diagnosis: Unsteadiness on feet (R26.81);Muscle weakness (generalized) (M62.81)                Time: 3267-1245 OT Time Calculation (min): 24 min Charges:  OT General Charges $OT Visit: 1 Visit OT Evaluation $OT Eval Low Complexity: 1 Low OT Treatments $Self Care/Home Management : 8-22 mins  Darleen Crocker, MS, OTR/L , CBIS ascom [phone]  10/01/20, 11:38 AM

## 2020-10-01 NOTE — TOC Initial Note (Signed)
Transition of Care [facility]) - Initial/Assessment Note    Patient Details  Name: Carol Carey MRN: [id number] Date of Birth: 12/26/1950  Transition of Care [facility]) CM/SW Contact:    Shelbie Ammons, RN Phone Number: 10/01/2020, 3:03 PM  Clinical Narrative:   RNCM met with patient and husband in room. Patient sitting up in recliner and reports to feeling well today. Patient is from home with her husband and it is her plan to return home. She reports that he is able to drive her to appointments and get her what he needs. Patient is agreeable to having home health set up and does not have preference as to agency. She also verbalizes understanding and agreement with plan for DME. Patient denies any other needs at this time.  RNCM reached out to [place] with [facility] and she will accept for home health.  RNCM reached out to [facility] with [facility] and she will deliver equipment.           Expected Discharge Plan: [place] Barriers to Discharge: No Barriers Identified   Patient Goals and CMS Choice     Choice offered to / list presented to : Patient  Expected Discharge Plan and Services Expected Discharge Plan: [place] Acute Care Choice: [place] arrangements for the past 2 months: [place]                 DME Arranged: 3-N-1, Walker rolling DME Agency: [facility] Date DME Agency Contacted: 10/01/20 Time DME Agency Contacted: [id number] Representative spoke with at DME Agency: Mardene Celeste HH Arranged: PT, OT HH Agency: [facility] [place] Date [place]: 10/01/20 Time HH Agency Contacted: [id number] Representative spoke with at [place]: Joelene Millin  Prior Living Arrangements/Services Living arrangements for the past 2 months: Single Family Home Lives with:: Spouse Patient language and need for interpreter reviewed:: Yes Do you feel safe going back to the place where you live?: Yes      Need for Family Participation  in Patient Care: Yes (Comment) Care giver support system in place?: Yes (comment)   Criminal Activity/Legal Involvement Pertinent to Current Situation/Hospitalization: No - Comment as needed  Activities of Daily Living Home Assistive Devices/Equipment: Eyeglasses ADL Screening (condition at time of admission) Patient's cognitive ability adequate to safely complete daily activities?: Yes Is the patient deaf or have difficulty hearing?: No Does the patient have difficulty seeing, even when wearing glasses/contacts?: No Does the patient have difficulty concentrating, remembering, or making decisions?: No Patient able to express need for assistance with ADLs?: Yes Does the patient have difficulty dressing or bathing?: No Independently performs ADLs?: No Communication: Independent Dressing (OT): Independent Grooming: Independent Feeding: Independent Bathing: Independent Toileting: Needs assistance Is this a change from baseline?: Pre-admission baseline In/Out Bed: Needs assistance Is this a change from baseline?: Pre-admission baseline Walks in Home: Needs assistance Is this a change from baseline?: Pre-admission baseline Does the patient have difficulty walking or climbing stairs?: Yes Weakness of Legs: Both Weakness of Arms/Hands: None  Permission Sought/Granted                  Emotional Assessment Appearance:: Appears stated age Attitude/Demeanor/Rapport: Engaged Affect (typically observed): Appropriate Orientation: : Oriented to Situation, Oriented to  Time, Oriented to Place, Oriented to Self Alcohol / Substance Use: Not Applicable Psych Involvement: No (comment)  Admission diagnosis:  Spondylolisthesis [M43.10] Patient Active Problem List   Diagnosis Date Noted  .  Spondylolisthesis 09/30/2020  . Hyperlipidemia 08/02/2019  . Degenerative joint disease 08/02/2019  . GERD (gastroesophageal reflux disease) 08/02/2019  . Hypothyroid 08/02/2019  . Osteoporosis  08/02/2019  . Sleep apnea 08/02/2019  . Tobacco use disorder 07/02/2019  . Carotid stenosis 07/02/2019  . Lymphadenitis, chronic 06/14/2019  . Atherosclerosis of arteries 05/04/2018  . Pulmonary emphysema ([place]) 05/04/2018  . Hx of adenomatous colonic polyps 05/02/2018  . Skin cancer 06/17/2016  . Post laminectomy syndrome 04/24/2012  . Essential (primary) hypertension December 21, 1950   PCP:  Idelle Crouch, MD Pharmacy:   [address], [place] [address] Phone: [phone] Fax: [phone]     Social Determinants of Health (SDOH) Interventions    Readmission Risk Interventions No flowsheet data found.

## 2020-10-02 MED ORDER — SENNA 8.6 MG PO TABS: 2.0000 | ORAL_TABLET | Freq: Two times a day (BID) | ORAL | 0 refills | Status: DC

## 2020-10-02 MED ORDER — ACETAMINOPHEN 500 MG PO TABS
1000.0000 mg | ORAL_TABLET | Freq: Four times a day (QID) | ORAL | 0 refills | Status: DC
Start: 2020-10-02 — End: 2022-07-13

## 2020-10-02 MED ORDER — GLYCERIN (LAXATIVE) 2.1 G RE SUPP
1.0000 | Freq: Once | RECTAL | Status: DC
  Filled 2020-10-02 (×2): qty 1

## 2020-10-02 MED ORDER — DOCUSATE SODIUM 100 MG PO CAPS
100.0000 mg | ORAL_CAPSULE | Freq: Two times a day (BID) | ORAL | 0 refills | Status: DC
Start: 2020-10-02 — End: 2022-07-13

## 2020-10-02 MED ORDER — POLYETHYLENE GLYCOL 3350 17 G PO PACK: 17.0000 g | PACK | Freq: Every day | ORAL | 0 refills | Status: DC | PRN

## 2020-10-02 NOTE — Progress Notes (Addendum)
Procedure: Anterior lumbar interbody fusion via a left retroperitoneal approach at L5/S1, posterior spinal fusion Procedure date: 09/30/2020 Diagnosis: lumbar radiculopathy    History: Carol Carey is s/p ALIF at L5-S1 POD2: Carol Carey is doing very well this morning. Pain is much better controlled this am. Needs to complete stairs with PT. HHPT has been set up.  POD1: Carol Carey is doing well this morning although she does endorse moderate lower back pain. She reports that her left leg pain has improved. Continues to endorse left foot "numbness", no change since prior to surgery Physical Exam: Vitals:   10/01/20 2228 10/01/20 2344  BP: 133/67 131/66  Pulse:  70  Resp:  18  Temp:  98.5 F (36.9 C)  SpO2:  97%    General: Alert and oriented, sitting in bed Strength:5/5 throughout  Sensation: intact and symmetric with exception of preexisting left foot decreased sensation Skin: Incisions clean, dry, intact  Data:  Recent Labs  Lab 09/30/20 0633  NA 133*  K 3.4*  CL 94*  BUN 11  CREATININE 1.00  GLUCOSE 114*   No results for input(s): AST, ALT, ALKPHOS in the last 168 hours.  Invalid input(s): TBILI   Recent Labs  Lab 09/30/20 0633  HGB 11.2*  HCT 33.0*   No results for input(s): APTT, INR in the last 168 hours.       Assessment/Plan:  Carol Carey is POD2 s/p L5-S1 ALIF.    #L5-S1 ALIF - mobilize - pain control - PTOT - Brace - Imaging - Aggressive bowel regimen, will advance diet as tolerated, increase to regular diet today - Glycerin suppository  #DVT Prophylaxis - DVT prophylaxis Lovenox 40mg  QD  #HTN - Continue home antihypertensives with hold parameters  Possible discharge later today if clears PT and continues to progress with bowel regimen.   Carol Face, NP Department of Neurosurgery

## 2020-10-02 NOTE — Progress Notes (Signed)
Physical Therapy Treatment Patient Details Name: Carol Carey MRN: [id number] DOB: 1951-07-13 Today's Date: 10/02/2020    History of Present Illness Pt is a 69 y.o. female s/p 10/27 anterior lumbar interbody fusion exposure via L retroperitoneal approach at L5-S1 secondary lumbar adjacent segment disease with spondylolisthesis; posterolateral fusion L5-S1.  Pt with LBP and pain/tingling radiating to L buttock and down lateral L leg to lateral foot prior to surgery.  PMH includes anxiety, htn, sleep apnea, spinal stenosis, pulmonary emphysema, posterior fusion lumbar spine 2010, lumbar XLIF-L3 (2013).    PT Comments    Pt sitting on edge of bed upon PT arrival.  Pt able to demonstrate ability to safely donn/doff lumbar back brace with minimal cueing from husband.  Modified independent with bed mobility via logrolling; SBA with transfers (occasional vc's for UE placement); SBA with ambulation 150 feet x2 with RW (limited distance ambulating d/t LBP); and CGA navigating 5 steps with L railing (initial vc's and visual demo for technique).  Overall pt steady and safe with functional mobility during sessions activities and pt's husband able to verbalize cues to pt as needed.  6/10 LBP (more on R side) at rest beginning of session and with activity but 3/10 at rest end of session.  Pt appears safe to discharge home with support of her husband: nurse notified.   Follow Up Recommendations  Home health PT     Equipment Recommendations  Rolling walker with 5" wheels;3in1 (PT) (youth sized)    Recommendations for Other Services OT consult     Precautions / Restrictions Precautions Precautions: Fall;Back Precaution Comments: No bending/twisting/arching; lumbar brace on when OOB Spinal Brace: Lumbar corset;Applied in sitting position Restrictions Weight Bearing Restrictions: No    Mobility  Bed Mobility Overal bed mobility: Needs Assistance Bed Mobility: Rolling;Sidelying to Sit;Sit to  Sidelying Rolling: Modified independent (Device/Increase time) Sidelying to sit: Modified independent (Device/Increase time)     Sit to sidelying: Modified independent (Device/Increase time) General bed mobility comments: mild increased effort to perform on own  Transfers Overall transfer level: Needs assistance Equipment used: Rolling walker (2 wheeled) Transfers: Sit to/from [facility] Sit to Stand: Supervision Stand pivot transfers: Supervision       General transfer comment: occasional vc's for UE placement for transfers  Ambulation/Gait Ambulation/Gait assistance: Supervision Gait Distance (Feet):  (150 feet x2) Assistive device: Rolling walker (2 wheeled)   Gait velocity: decreased   General Gait Details: partial step through gait pattern; steady with RW; limited distance d/t LBP   Stairs Stairs: Yes Stairs assistance: Min guard Stair Management: One rail Left;Step to pattern;Sideways Number of Stairs: 5 General stair comments: initial vc's and demo for technique and then pt able to perform safely on own   Wheelchair Mobility    Modified Rankin (Stroke Patients Only)       Balance Overall balance assessment: Needs assistance Sitting-balance support: No upper extremity supported;Feet supported Sitting balance-Leahy Scale: Good Sitting balance - Comments: steady sitting reaching within BOS   Standing balance support: During functional activity Standing balance-Leahy Scale: Good Standing balance comment: steady ambulating with B UE support on RW                            Cognition Arousal/Alertness: Awake/alert Behavior During Therapy: WFL for tasks assessed/performed Overall Cognitive Status: Within Functional Limits for tasks assessed  Exercises      General Comments General comments (skin integrity, edema, etc.): mild drainage noted from dressings low back and L  abdomen. Nursing cleared pt for participation in physical therapy.  Pt agreeable to PT session.  Pt's husband present entire session.      Pertinent Vitals/Pain Pain Assessment: 0-10 Pain Score: 3  (6/10 beginning of session and with activities; 3/10 at rest end of session) Pain Location: low back Pain Descriptors / Indicators: Sore Pain Intervention(s): Limited activity within patient's tolerance;Monitored during session;Premedicated before session;Repositioned  Vitals (HR and O2 on room air) stable and WFL throughout treatment session.    Home Living                      Prior Function            PT Goals (current goals can now be found in the care plan section) Acute Rehab PT Goals Patient Stated Goal: to improve pain and mobility PT Goal Formulation: With patient Time For Goal Achievement: 10/15/20 Potential to Achieve Goals: Good Progress towards PT goals: Progressing toward goals    Frequency    BID      PT Plan Current plan remains appropriate    Co-evaluation              AM-PAC PT "6 Clicks" Mobility   Outcome Measure  Help needed turning from your back to your side while in a flat bed without using bedrails?: A Little Help needed moving from lying on your back to sitting on the side of a flat bed without using bedrails?: A Little Help needed moving to and from a bed to a chair (including a wheelchair)?: A Little Help needed standing up from a chair using your arms (e.g., wheelchair or bedside chair)?: A Little Help needed to walk in hospital room?: A Little Help needed climbing 3-5 steps with a railing? : A Little 6 Click Score: 18    End of Session Equipment Utilized During Treatment: Gait belt;Back brace (back brace donned and doffed sitting edge of bed) Activity Tolerance: Patient limited by pain Patient left: in bed;with call bell/phone within reach;with bed alarm set;with family/visitor present;with SCD's reapplied;Other (comment) (B  heels floating via pillow support) Nurse Communication: Mobility status;Precautions PT Visit Diagnosis: Other abnormalities of gait and mobility (R26.89);Muscle weakness (generalized) (M62.81);Difficulty in walking, not elsewhere classified (R26.2);Pain Pain - part of body:  (low back (more on R side))     Time: 8315-1761 PT Time Calculation (min) (ACUTE ONLY): 32 min  Charges:  $Gait Training: 8-22 mins $Therapeutic Activity: 8-22 mins                    Leitha Bleak, PT 10/02/20, 10:58 AM

## 2020-10-02 NOTE — Progress Notes (Signed)
Pt d/c to home this afternoon via POV.  D/c paperwork reviewed with pt and she expressed understanding.  IV removed from pt L forearm without issue.  Pt was wheeled down to med mall ent by NT and assisted into car.  Brace was on while pt up OOB.

## 2020-10-02 NOTE — Discharge Instructions (Signed)
Your surgeon has performed an operation on your lumbar spine (low back) to fuse two or more of the vertebrae (bones) together. This procedure is performed to treat a number of different spinal problems, including narrowing of the spinal canal (stenosis), herniated discs, degenerative changes, and injuries.   Many times, patients feel better immediately after surgery and can "overdo it." Even if you feel well, it is important that you follow these activity guidelines. If you do not let your back heal properly from the surgery, you can increase the chance of return of your symptoms and other complications. The following are instructions to help in your recovery once you have been discharged from the hospital.   * Do not take anti-inflammatory medications for 3 months after surgery (naproxen [Aleve], ibuprofen [Advil, Motrin], etc.). These medications can prevent your bones from healing properly.   Activity     No bending, lifting, or twisting ("BLT"). Avoid lifting objects heavier than 10 pounds (gallon milk jug).  Where possible, avoid household activities that involve lifting, bending, reaching, pushing, or pulling such as laundry, vacuuming, grocery shopping, and childcare. Try to arrange for help from friends and family for these activities while your back heals.   Increase physical activity slowly as tolerated.  Taking short walks is encouraged, but avoid strenuous exercise. Do not jog, run, bicycle, lift weights, or participate in any other exercises unless specifically allowed by your doctor. Avoid prolonged sitting, including car rides.   Talk to your doctor before resuming sexual activity.   You should not drive until cleared by your doctor.   Until released by your doctor, you should not return to work or school.  You should rest at home and let your body heal.   You may shower three days after your surgery.  After showering, lightly dab your incision dry. Do not take a tub bath or go  swimming until approved by your doctor at your follow-up appointment.   If your doctor ordered a lumbar brace for you, you should wear it whenever you are out of bed. You may remove it when lying down or sleeping. You should also wear it when riding in a car. Not all back surgeries require a lumbar brace.   If you smoke, we strongly recommend that you quit.  Smoking has been proven to interfere with normal bone healing and will dramatically reduce the success rate of your surgery. Please contact [facility] (800-QUIT-NOW) and use the resources at www.[facility].com for assistance in stopping smoking.   Surgical Incision   If you have a dressing on your incision, remove it 2 days after your surgery. Keep your incision area clean and dry.   If you have staples or stitches on your incision, you should have a follow up scheduled for removal. If you do not have staples or stitches, you will have steri-strips (small pieces of surgical tape) or Dermabond glue. The steri-strips/glue should begin to peel away within about a week (it is fine if the steri-strips fall off before then). If the strips are still in place one week after your surgery, you may gently remove them.   Diet           You may return to your usual diet. Be sure to stay hydrated.   When to Contact us   Although your surgery and recovery will likely be uneventful, you may have some residual numbness, aches, and pains in your back and/or legs. This is normal and should improve in the next few  weeks.   However, should you experience any of the following, contact us immediately:   - New numbness or weakness   - Pain that is progressively getting worse, and is not relieved by your pain medications or rest   - Bleeding, redness, swelling, pain, or drainage from surgical incision   - Chills or flu-like symptoms   - Fever greater than 101.0 F (38.3 C)   - Problems with bowel or bladder functions   - Difficulty breathing or shortness of  breath   - Warmth, tenderness, or swelling in your calf   Contact Information   - During office hours (Monday-Friday 9 am to 5 pm), please call your physician at [phone]  - After hours and weekends, please call [phone] and an answering service will put you in touch with either Dr. Lacinda Axon or Dr. Izora Ribas.   - For a life-threatening emergency, call 911

## 2020-10-02 NOTE — Care Management Important Message (Signed)
Important Message  Patient Details  Name: TEMPRENCE RHINES MRN: [id number] Date of Birth: 01-06-1951   Medicare Important Message Given:  N/A - LOS <3 / Initial given by admissions     Juliann Pulse A Neve Branscomb 10/02/2020, 8:09 AM

## 2020-10-02 NOTE — Discharge Summary (Signed)
  Procedure: Anterior lumbar interbody fusion via a left retroperitoneal approach at L5/S1, posterior spinal fusion Procedure date: 09/30/2020 Diagnosis: lumbar radiculopathy    History: Carol Carey is s/p ALIF at L5-S1 POD2: Carol Carey is doing very well this morning. Pain is much better controlled this am. Needs to complete stairs with PT. HHPT has been set up.   Afternoon assessment: She has met PT goals and is eager for discharge home.  POD1: Carol Carey is doing well this morning although she does endorse moderate lower back pain. She reports that her left leg pain has improved. Continues to endorse left foot "numbness", no change since prior to surgery Physical Exam: Vitals:   10/01/20 2344 10/02/20 0817  BP: 131/66 127/69  Pulse: 70 65  Resp: 18 16  Temp: 98.5 F (36.9 C) 97.7 F (36.5 C)  SpO2: 97% 94%    General: Alert and oriented, sitting in bed Strength:5/5 throughout  Sensation: intact and symmetric with exception of preexisting left foot decreased sensation Skin: Incisions clean, dry, intact  Data:  Recent Labs  Lab 09/30/20 0633  NA 133*  K 3.4*  CL 94*  BUN 11  CREATININE 1.00  GLUCOSE 114*   No results for input(s): AST, ALT, ALKPHOS in the last 168 hours.  Invalid input(s): TBILI   Recent Labs  Lab 09/30/20 0633  HGB 11.2*  HCT 33.0*   No results for input(s): APTT, INR in the last 168 hours.       Assessment/Plan:  Carol Carey is POD2 s/p L5-S1 ALIF.  She has had a BM, has met her PT/OT goals, and is stable for discharge home.   She will follow up with me in two weeks in clinic.   Lonell Face, NP Department of Neurosurgery

## 2020-10-04 ENCOUNTER — Encounter: Payer: Self-pay | Admitting: Neurosurgery

## 2020-10-14 ENCOUNTER — Telehealth (INDEPENDENT_AMBULATORY_CARE_PROVIDER_SITE_OTHER): Payer: Self-pay | Admitting: Vascular Surgery

## 2020-10-19 ENCOUNTER — Ambulatory Visit (INDEPENDENT_AMBULATORY_CARE_PROVIDER_SITE_OTHER): Payer: Medicare Other | Admitting: Nurse Practitioner

## 2020-10-19 ENCOUNTER — Encounter (INDEPENDENT_AMBULATORY_CARE_PROVIDER_SITE_OTHER): Payer: Self-pay | Admitting: Nurse Practitioner

## 2020-10-19 ENCOUNTER — Other Ambulatory Visit: Payer: Self-pay

## 2020-10-19 VITALS — BP 143/82 | HR 83 | Resp 16 | Wt 162.8 lb

## 2020-10-19 DIAGNOSIS — K5903 Drug induced constipation: Secondary | ICD-10-CM

## 2020-10-19 DIAGNOSIS — E785 Hyperlipidemia, unspecified: Secondary | ICD-10-CM

## 2020-10-19 DIAGNOSIS — T148XXA Other injury of unspecified body region, initial encounter: Secondary | ICD-10-CM

## 2020-10-25 ENCOUNTER — Encounter (INDEPENDENT_AMBULATORY_CARE_PROVIDER_SITE_OTHER): Payer: Self-pay | Admitting: Nurse Practitioner

## 2020-10-25 NOTE — Progress Notes (Signed)
Subjective:    Patient ID: Carol Carey, female    DOB: 1951/03/20, 69 y.o.   MRN: [id number] Chief Complaint  Patient presents with  . Follow-up    add on for wound check    The patient presents today for wound check following intervention, including:  PROCEDURE: 1. Anterior lumbar interbody fusion exposure via left retroperitoneal approach at the L5-S1 level  The patient previously had the staples removed during her follow-up visit with neurosurgery.  The patient notes that her stomach feels much better now that the staples have been removed.  She notes that it is less tender.  There are no signs or symptoms of infection currently.  The wound is clean dry and intact and covered with Steri-Strips.  The patient's biggest complaint currently is with constipation.  The patient notes that following surgery she has had extreme difficulty with bowel movements.  She has begun to have some nausea associated with it.   Review of Systems  Gastrointestinal: Positive for constipation.  Skin: Positive for wound.  All other systems reviewed and are negative.      Objective:   Physical Exam Vitals reviewed.  HENT:     Head: Normocephalic.  Cardiovascular:     Rate and Rhythm: Normal rate.     Pulses: Normal pulses.  Pulmonary:     Effort: Pulmonary effort is normal.  Abdominal:     General: There is distension.  Skin:    General: Skin is warm and dry.  Neurological:     Mental Status: She is alert and oriented to person, place, and time.  Psychiatric:        Mood and Affect: Mood normal.        Behavior: Behavior normal.        Thought Content: Thought content normal.        Judgment: Judgment normal.     BP (!) 143/82 (BP Location: Left Arm)   Pulse 83   Resp 16   Wt 162 lb 12.8 oz (73.8 kg)   BMI 31.79 kg/m   Past Medical History:  Diagnosis Date  . Arthritis   . Carotid artery stenosis   . Colon polyp   . Degenerative joint disease   . GERD (gastroesophageal  reflux disease)   . Hyperlipidemia   . Hypertension   . Hypothyroidism   . Osteoporosis   . Pulmonary emphysema ([place])   . Skin cancer    lesion removed from nose  . Spinal stenosis   . Thyroid disease     Social History   Socioeconomic History  . Marital status: Married    Spouse name: Not on file  . Number of children: Not on file  . Years of education: Not on file  . Highest education level: Not on file  Occupational History  . Not on file  Tobacco Use  . Smoking status: Former Smoker    Packs/day: 1.00    Years: 48.00    Pack years: 48.00    Types: Cigarettes    Quit date: 02/14/2020    Years since quitting: 0.6  . Smokeless tobacco: Never Used  Vaping Use  . Vaping Use: Never used  Substance and Sexual Activity  . Alcohol use: Yes    Alcohol/week: 14.0 standard drinks    Types: 14 Glasses of wine per week  . Drug use: Not Currently    Types: Marijuana    Comment: 2018 tried using  . Sexual activity: Not on file  Other  Topics Concern  . Not on file  Social History Narrative  . Not on file   Social Determinants of Health   Financial Resource Strain:   . Difficulty of Paying Living Expenses: Not on file  Food Insecurity:   . Worried About Charity fundraiser in the Last Year: Not on file  . Ran Out of Food in the Last Year: Not on file  Transportation Needs:   . Lack of Transportation (Medical): Not on file  . Lack of Transportation (Non-Medical): Not on file  Physical Activity:   . Days of Exercise per Week: Not on file  . Minutes of Exercise per Session: Not on file  Stress:   . Feeling of Stress : Not on file  Social Connections:   . Frequency of Communication with Friends and Family: Not on file  . Frequency of Social Gatherings with Friends and Family: Not on file  . Attends Religious Services: Not on file  . Active Member of Clubs or Organizations: Not on file  . Attends Archivist Meetings: Not on file  . Marital Status: Not on file    Intimate Partner Violence:   . Fear of Current or Ex-Partner: Not on file  . Emotionally Abused: Not on file  . Physically Abused: Not on file  . Sexually Abused: Not on file    Past Surgical History:  Procedure Laterality Date  . ABDOMINAL EXPOSURE N/A 09/30/2020   Procedure: ABDOMINAL EXPOSURE;  Surgeon: Algernon Huxley, MD;  Location: [facility] ORS;  Service: Vascular;  Laterality: N/A;  . ABDOMINAL HYSTERECTOMY  1993  . ANTERIOR AND POSTERIOR SPINAL FUSION N/A 09/30/2020   Procedure: L5-S1 ANTERIOR LUMBAR INTERBODY FUSION, L3-S1 INSTRUMENTATION;  Surgeon: Meade Maw, MD;  Location: [facility] ORS;  Service: Neurosurgery;  Laterality: N/A;  . APPENDECTOMY  1974  . BACK SURGERY    . BREAST EXCISIONAL BIOPSY Right 1985   Benign cycts removed  . CESAREAN SECTION  1993  . COLONOSCOPY    . LUMBAR FUSION  2010   Dr. March Rummage  . LUMBAR FUSION  2013   x 2 Dr. Ellender Hose  . TONSILLECTOMY    . TUBAL LIGATION  1974  . UPPER GI ENDOSCOPY      Family History  Problem Relation Age of Onset  . Hypertension Mother   . Heart attack Father   . Lung cancer Brother   . Breast cancer Neg Hx     Allergies  Allergen Reactions  . Demerol [Meperidine Hcl] Nausea And Vomiting    Vomiting lasted for 2 days after taking med  . Lisinopril Cough  . Latex Rash  . Penicillins Rash  . Sulfa Antibiotics Rash    CBC Latest Ref Rng & Units 09/30/2020 09/21/2020  WBC 4.0 - 10.5 K/uL - 7.0  Hemoglobin 12.0 - 15.0 g/dL 11.2(L) 12.4  Hematocrit 36 - 46 % 33.0(L) 35.1(L)  Platelets 150 - 400 K/uL - 229      CMP     Component Value Date/Time   NA 133 (L) 09/30/2020 0633   K 3.4 (L) 09/30/2020 0633   CL 94 (L) 09/30/2020 0633   CO2 26 09/21/2020 1359   GLUCOSE 114 (H) 09/30/2020 0633   BUN 11 09/30/2020 0633   CREATININE 1.00 09/30/2020 0633   CALCIUM 9.5 09/21/2020 1359   GFRNONAA >60 09/21/2020 1359   GFRAA >60 05/15/2019 0935     No results found.     Assessment & Plan:   1. Drug-induced  constipation  The patient continues to have difficulty with constipation following her surgery.  She has been taking Colace and lactulose without much success.  Has adjusted also adding MiraLAX to her regimen and to try to drink as much water as possible.  Activity also helps with constipation.  The patient may also perform an enema if she is extremely uncomfortable.  2. Hyperlipidemia, unspecified hyperlipidemia type Continue statin as ordered and reviewed, no changes at this time   3. Surgical wound present The staples were taken out several days ago during her visit with neurosurgery.  Today the wound is intact with no obvious signs/symptoms of infection.  We will have the patient return to the office in 6 weeks for wound evaluation.   Current Outpatient Medications on File Prior to Visit  Medication Sig Dispense Refill  . amLODipine (NORVASC) 5 MG tablet Take 5 mg by mouth daily.     Marland Kitchen atenolol (TENORMIN) 50 MG tablet Take 50 mg by mouth at bedtime.     Marland Kitchen atorvastatin (LIPITOR) 20 MG tablet Take 20 mg by mouth daily.     Marland Kitchen buPROPion (WELLBUTRIN XL) 150 MG 24 hr tablet Take 150 mg by mouth daily.    Marland Kitchen docusate sodium (COLACE) 100 MG capsule Take 1 capsule (100 mg total) by mouth 2 (two) times daily. 10 capsule 0  . esomeprazole (NEXIUM) 20 MG capsule Take 20 mg by mouth daily.     . hydrochlorothiazide (HYDRODIURIL) 25 MG tablet Take 12.5 mg by mouth daily.     Marland Kitchen lactulose (CHRONULAC) 10 GM/15ML solution SMARTSIG:Milliliter(s) By Mouth    . levothyroxine (SYNTHROID) 112 MCG tablet Take 112 mcg by mouth at bedtime.     Marland Kitchen losartan (COZAAR) 100 MG tablet Take 100 mg by mouth daily.     . methocarbamol (ROBAXIN) 500 MG tablet Take 500 mg by mouth every 8 (eight) hours as needed for muscle spasms.    . montelukast (SINGULAIR) 10 MG tablet Take 10 mg by mouth at bedtime.     . ondansetron (ZOFRAN-ODT) 4 MG disintegrating tablet Take by mouth.    . polyethylene glycol (MIRALAX / GLYCOLAX) 17 g  packet Take 17 g by mouth daily as needed for mild constipation. 14 each 0  . pregabalin (LYRICA) 75 MG capsule Take 75 mg by mouth daily.    Marland Kitchen acetaminophen (TYLENOL) 500 MG tablet Take 2 tablets (1,000 mg total) by mouth every 6 (six) hours. (Patient not taking: Reported on 10/19/2020) 30 tablet 0  . senna (SENOKOT) 8.6 MG TABS tablet Take 2 tablets (17.2 mg total) by mouth 2 (two) times daily. (Patient not taking: Reported on 10/19/2020) 120 tablet 0   No current facility-administered medications on file prior to visit.    There are no Patient Instructions on file for this visit. No follow-ups on file.   Kris Hartmann, NP

## 2020-11-12 ENCOUNTER — Other Ambulatory Visit: Payer: Self-pay | Admitting: Internal Medicine

## 2020-11-12 DIAGNOSIS — Z1231 Encounter for screening mammogram for malignant neoplasm of breast: Secondary | ICD-10-CM

## 2020-11-30 ENCOUNTER — Ambulatory Visit (INDEPENDENT_AMBULATORY_CARE_PROVIDER_SITE_OTHER): Payer: Medicare Other | Admitting: Nurse Practitioner

## 2020-12-23 ENCOUNTER — Encounter (INDEPENDENT_AMBULATORY_CARE_PROVIDER_SITE_OTHER): Payer: Self-pay | Admitting: Nurse Practitioner

## 2020-12-23 ENCOUNTER — Ambulatory Visit (INDEPENDENT_AMBULATORY_CARE_PROVIDER_SITE_OTHER): Payer: Medicare Other | Admitting: Nurse Practitioner

## 2020-12-23 ENCOUNTER — Other Ambulatory Visit: Payer: Self-pay

## 2020-12-23 VITALS — BP 146/83 | HR 76 | Ht 59.0 in | Wt 161.0 lb

## 2020-12-23 DIAGNOSIS — K5903 Drug induced constipation: Secondary | ICD-10-CM

## 2020-12-23 DIAGNOSIS — I10 Essential (primary) hypertension: Secondary | ICD-10-CM

## 2020-12-23 DIAGNOSIS — T148XXA Other injury of unspecified body region, initial encounter: Secondary | ICD-10-CM

## 2020-12-23 DIAGNOSIS — I6523 Occlusion and stenosis of bilateral carotid arteries: Secondary | ICD-10-CM

## 2021-01-03 ENCOUNTER — Encounter (INDEPENDENT_AMBULATORY_CARE_PROVIDER_SITE_OTHER): Payer: Self-pay | Admitting: Nurse Practitioner

## 2021-01-03 NOTE — Progress Notes (Signed)
Subjective:    Patient ID: Carol Carey, female    DOB: Jun 26, 1951, 70 y.o.   MRN: [id number] Chief Complaint  Patient presents with  . Follow-up    6 wk no studies    Patient presents today for wound evaluation.  The patient recently had a lumbar fusion done by neurosurgery.  Vascular surgery was asked to help expose the spine from an anterior ankle.  Since the patient's last office visit the constipation has resolved.  The redness and discomfort of the wound has resolved as well.  However, the patient does still note some tenderness which is not unusual at this time.  Overall she is healing well.   Review of Systems  Skin: Positive for wound.  All other systems reviewed and are negative.      Objective:   Physical Exam Vitals reviewed.  HENT:     Head: Normocephalic.  Cardiovascular:     Rate and Rhythm: Normal rate.     Pulses: Normal pulses.  Pulmonary:     Effort: Pulmonary effort is normal.  Neurological:     Mental Status: She is alert and oriented to person, place, and time.  Psychiatric:        Mood and Affect: Mood normal.        Behavior: Behavior normal.        Thought Content: Thought content normal.        Judgment: Judgment normal.     BP (!) 146/83   Pulse 76   Ht 4\' 11"  (1.499 m)   Wt 161 lb (73 kg)   BMI 32.52 kg/m   Past Medical History:  Diagnosis Date  . Arthritis   . Carotid artery stenosis   . Colon polyp   . Degenerative joint disease   . GERD (gastroesophageal reflux disease)   . Hyperlipidemia   . Hypertension   . Hypothyroidism   . Osteoporosis   . Pulmonary emphysema ([place])   . Skin cancer    lesion removed from nose  . Spinal stenosis   . Thyroid disease     Social History   Socioeconomic History  . Marital status: Married    Spouse name: Not on file  . Number of children: Not on file  . Years of education: Not on file  . Highest education level: Not on file  Occupational History  . Not on file  Tobacco Use  .  Smoking status: Former Smoker    Packs/day: 1.00    Years: 48.00    Pack years: 48.00    Types: Cigarettes    Quit date: 02/14/2020    Years since quitting: 0.8  . Smokeless tobacco: Never Used  Vaping Use  . Vaping Use: Never used  Substance and Sexual Activity  . Alcohol use: Yes    Alcohol/week: 14.0 standard drinks    Types: 14 Glasses of wine per week  . Drug use: Not Currently    Types: Marijuana    Comment: 2018 tried using  . Sexual activity: Not on file  Other Topics Concern  . Not on file  Social History Narrative  . Not on file   Social Determinants of Health   Financial Resource Strain: Not on file  Food Insecurity: Not on file  Transportation Needs: Not on file  Physical Activity: Not on file  Stress: Not on file  Social Connections: Not on file  Intimate Partner Violence: Not on file    Past Surgical History:  Procedure Laterality Date  .  ABDOMINAL EXPOSURE N/A 09/30/2020   Procedure: ABDOMINAL EXPOSURE;  Surgeon: Algernon Huxley, MD;  Location: [facility] ORS;  Service: Vascular;  Laterality: N/A;  . ABDOMINAL HYSTERECTOMY  1993  . ANTERIOR AND POSTERIOR SPINAL FUSION N/A 09/30/2020   Procedure: L5-S1 ANTERIOR LUMBAR INTERBODY FUSION, L3-S1 INSTRUMENTATION;  Surgeon: Meade Maw, MD;  Location: [facility] OR[facility];  Service: Neurosurgery;  Laterality: N/A;  . APPENDECTOMY  1974  . BACK SURGERY    . BREAST EXCISIONAL BIOPSY Right 1985   Benign cycts removed  . CESAREAN SECTION  1993  . COLONOSCOPY    . LUMBAR FUSION  2010   Dr. March Rummage  . LUMBAR FUSION  2013   x 2 Dr. Ellender Hose  . TONSILLECTOMY    . TUBAL LIGATION  1974  . UPPER GI ENDOSCOPY      Family History  Problem Relation Age of Onset  . Hypertension Mother   . Heart attack Father   . Lung cancer Brother   . Breast cancer Neg Hx     Allergies  Allergen Reactions  . Demerol [Meperidine Hcl] Nausea And Vomiting    Vomiting lasted for 2 days after taking med  . Lisinopril Cough  . Latex Rash  .  Penicillins Rash  . Sulfa Antibiotics Rash    CBC Latest Ref Rng & Units 09/30/2020 09/21/2020  WBC 4.0 - 10.5 K/uL - 7.0  Hemoglobin 12.0 - 15.0 g/dL 11.2(L) 12.4  Hematocrit 36.0 - 46.0 % 33.0(L) 35.1(L)  Platelets 150 - 400 K/uL - 229      CMP     Component Value Date/Time   NA 133 (L) 09/30/2020 0633   K 3.4 (L) 09/30/2020 0633   CL 94 (L) 09/30/2020 0633   CO2 26 09/21/2020 1359   GLUCOSE 114 (H) 09/30/2020 0633   BUN 11 09/30/2020 0633   CREATININE 1.00 09/30/2020 0633   CALCIUM 9.5 09/21/2020 1359   GFRNONAA >60 09/21/2020 1359   GFRAA >60 05/15/2019 0935     No results found.     Assessment & Plan:   1. Surgical wound present Patient's wound is completely healed at this time.  There is still some tenderness as to be expected.  Patient will continue to follow with neurosurgeon for lumbar fusion.  2. Bilateral carotid artery stenosis No worsening signs symptoms patient will follow up soon with carotid artery duplex.  3. Essential (primary) hypertension Continue antihypertensive medications as already ordered, these medications have been reviewed and there are no changes at this time.   4. Drug-induced constipation Resolved at this time   Current Outpatient Medications on File Prior to Visit  Medication Sig Dispense Refill  . acetaminophen (TYLENOL) 500 MG tablet Take 2 tablets (1,000 mg total) by mouth every 6 (six) hours. 30 tablet 0  . amLODipine (NORVASC) 5 MG tablet Take 5 mg by mouth daily.     Marland Kitchen atenolol (TENORMIN) 50 MG tablet Take 50 mg by mouth at bedtime.     Marland Kitchen atorvastatin (LIPITOR) 20 MG tablet Take 20 mg by mouth daily.     Marland Kitchen buPROPion (WELLBUTRIN XL) 150 MG 24 hr tablet Take 150 mg by mouth daily.    Marland Kitchen docusate sodium (COLACE) 100 MG capsule Take 1 capsule (100 mg total) by mouth 2 (two) times daily. 10 capsule 0  . esomeprazole (NEXIUM) 20 MG capsule Take 20 mg by mouth daily.     . fluticasone (FLONASE) 50 MCG/ACT nasal spray Place into  the nose.    . hydrochlorothiazide (  HYDRODIURIL) 25 MG tablet Take 12.5 mg by mouth daily.     Marland Kitchen lactulose (CHRONULAC) 10 GM/15ML solution SMARTSIG:Milliliter(s) By Mouth    . levothyroxine (SYNTHROID) 100 MCG tablet Take 100 mcg by mouth daily.    Marland Kitchen levothyroxine (SYNTHROID) 112 MCG tablet Take 112 mcg by mouth at bedtime.     Marland Kitchen losartan (COZAAR) 100 MG tablet Take 100 mg by mouth daily.     . methocarbamol (ROBAXIN) 500 MG tablet Take 500 mg by mouth every 8 (eight) hours as needed for muscle spasms.    . montelukast (SINGULAIR) 10 MG tablet Take 10 mg by mouth at bedtime.     . ondansetron (ZOFRAN-ODT) 4 MG disintegrating tablet Take by mouth.    . oxyCODONE (OXY IR/ROXICODONE) 5 MG immediate release tablet Take by mouth.    . polyethylene glycol (MIRALAX / GLYCOLAX) 17 g packet Take 17 g by mouth daily as needed for mild constipation. 14 each 0  . pregabalin (LYRICA) 75 MG capsule Take 75 mg by mouth daily.    Marland Kitchen senna (SENOKOT) 8.6 MG TABS tablet Take 2 tablets (17.2 mg total) by mouth 2 (two) times daily. 120 tablet 0   No current facility-administered medications on file prior to visit.    There are no Patient Instructions on file for this visit. No follow-ups on file.   Kris Hartmann, NP

## 2021-01-25 ENCOUNTER — Ambulatory Visit
Admission: RE | Admit: 2021-01-25 | Discharge: 2021-01-25 | Disposition: A | Discharge status: Home / Self Care | Payer: Medicare Other | Source: Ambulatory Visit | Attending: Internal Medicine | Admitting: Internal Medicine

## 2021-01-25 ENCOUNTER — Other Ambulatory Visit: Payer: Self-pay

## 2021-01-25 DIAGNOSIS — Z1231 Encounter for screening mammogram for malignant neoplasm of breast: Secondary | ICD-10-CM | POA: Diagnosis present

## 2021-02-01 ENCOUNTER — Other Ambulatory Visit: Payer: Self-pay | Admitting: Internal Medicine

## 2021-02-01 DIAGNOSIS — N6310 Unspecified lump in the right breast, unspecified quadrant: Secondary | ICD-10-CM

## 2021-02-01 DIAGNOSIS — R928 Other abnormal and inconclusive findings on diagnostic imaging of breast: Secondary | ICD-10-CM

## 2021-02-01 DIAGNOSIS — N6320 Unspecified lump in the left breast, unspecified quadrant: Secondary | ICD-10-CM

## 2021-02-08 ENCOUNTER — Ambulatory Visit
Admission: RE | Admit: 2021-02-08 | Discharge: 2021-02-08 | Disposition: A | Discharge status: Home / Self Care | Payer: Medicare Other | Source: Ambulatory Visit | Attending: Internal Medicine | Admitting: Internal Medicine

## 2021-02-08 ENCOUNTER — Other Ambulatory Visit: Payer: Self-pay

## 2021-02-08 DIAGNOSIS — N6320 Unspecified lump in the left breast, unspecified quadrant: Secondary | ICD-10-CM

## 2021-02-08 DIAGNOSIS — R928 Other abnormal and inconclusive findings on diagnostic imaging of breast: Secondary | ICD-10-CM | POA: Diagnosis present

## 2021-02-08 DIAGNOSIS — N6310 Unspecified lump in the right breast, unspecified quadrant: Secondary | ICD-10-CM | POA: Diagnosis present

## 2021-02-10 ENCOUNTER — Other Ambulatory Visit: Payer: Self-pay | Admitting: Internal Medicine

## 2021-02-10 DIAGNOSIS — N6310 Unspecified lump in the right breast, unspecified quadrant: Secondary | ICD-10-CM

## 2021-02-10 DIAGNOSIS — N6320 Unspecified lump in the left breast, unspecified quadrant: Secondary | ICD-10-CM

## 2021-02-10 DIAGNOSIS — R928 Other abnormal and inconclusive findings on diagnostic imaging of breast: Secondary | ICD-10-CM

## 2021-02-15 ENCOUNTER — Ambulatory Visit
Admission: RE | Admit: 2021-02-15 | Discharge: 2021-02-15 | Disposition: A | Discharge status: Home / Self Care | Payer: Medicare Other | Source: Ambulatory Visit | Attending: Internal Medicine | Admitting: Internal Medicine

## 2021-02-15 ENCOUNTER — Other Ambulatory Visit: Payer: Self-pay

## 2021-02-15 DIAGNOSIS — N6310 Unspecified lump in the right breast, unspecified quadrant: Secondary | ICD-10-CM

## 2021-02-15 DIAGNOSIS — R928 Other abnormal and inconclusive findings on diagnostic imaging of breast: Secondary | ICD-10-CM | POA: Diagnosis present

## 2021-02-15 DIAGNOSIS — N6320 Unspecified lump in the left breast, unspecified quadrant: Secondary | ICD-10-CM

## 2021-02-15 HISTORY — PX: BREAST BIOPSY: SHX20

## 2021-02-16 ENCOUNTER — Encounter (INDEPENDENT_AMBULATORY_CARE_PROVIDER_SITE_OTHER): Payer: Self-pay | Admitting: Vascular Surgery

## 2021-02-16 ENCOUNTER — Ambulatory Visit (INDEPENDENT_AMBULATORY_CARE_PROVIDER_SITE_OTHER): Payer: Medicare Other | Admitting: Vascular Surgery

## 2021-02-16 ENCOUNTER — Ambulatory Visit (INDEPENDENT_AMBULATORY_CARE_PROVIDER_SITE_OTHER): Payer: Medicare Other

## 2021-02-16 VITALS — BP 121/75 | HR 78 | Ht 59.0 in | Wt 165.0 lb

## 2021-02-16 DIAGNOSIS — I6523 Occlusion and stenosis of bilateral carotid arteries: Secondary | ICD-10-CM | POA: Diagnosis not present

## 2021-02-16 DIAGNOSIS — I10 Essential (primary) hypertension: Secondary | ICD-10-CM

## 2021-02-16 DIAGNOSIS — E785 Hyperlipidemia, unspecified: Secondary | ICD-10-CM | POA: Diagnosis not present

## 2021-02-16 LAB — SURGICAL PATHOLOGY

## 2021-02-16 NOTE — Progress Notes (Signed)
MRN : [id number]  Carol Carey is a 70 y.o. (10/31/1951) female who presents with chief complaint of  Chief Complaint  Patient presents with  . Follow-up    1 yr   . Carotid  .  History of Present Illness: Patient returns in follow up of her carotid disease.  She is doing well without any focal neurologic symptoms.  Since her last visit, she has undergone back surgery about 5 months ago.  We actually performed the anterior exposure for her spinal surgery, and that wound has healed well.  She is still troubled by back issues.  No stroke or TIA symptoms.  Her carotid duplex today actually shows 1 to 39% ICA stenosis bilaterally by duplex criteria.  This is slightly better on the left than it was previously and stable on the right.  Current Outpatient Medications  Medication Sig Dispense Refill  . acetaminophen (TYLENOL) 500 MG tablet Take 2 tablets (1,000 mg total) by mouth every 6 (six) hours. 30 tablet 0  . amLODipine (NORVASC) 5 MG tablet Take 5 mg by mouth daily.     Marland Kitchen atenolol (TENORMIN) 50 MG tablet Take 50 mg by mouth at bedtime.     Marland Kitchen atorvastatin (LIPITOR) 20 MG tablet Take 20 mg by mouth daily.     Marland Kitchen buPROPion (WELLBUTRIN XL) 150 MG 24 hr tablet Take 150 mg by mouth daily.    . cyclobenzaprine (FLEXERIL) 10 MG tablet Take by mouth.    . docusate sodium (COLACE) 100 MG capsule Take 1 capsule (100 mg total) by mouth 2 (two) times daily. 10 capsule 0  . esomeprazole (NEXIUM) 20 MG capsule Take 20 mg by mouth daily.     . fluticasone (FLONASE) 50 MCG/ACT nasal spray Place into the nose.    . hydrochlorothiazide (HYDRODIURIL) 25 MG tablet Take 12.5 mg by mouth daily.     Marland Kitchen lactulose (CHRONULAC) 10 GM/15ML solution SMARTSIG:Milliliter(s) By Mouth    . levothyroxine (SYNTHROID) 100 MCG tablet Take 100 mcg by mouth daily.    Marland Kitchen levothyroxine (SYNTHROID) 112 MCG tablet Take 112 mcg by mouth at bedtime.     Marland Kitchen losartan (COZAAR) 100 MG tablet Take 100 mg by mouth daily.     .  methocarbamol (ROBAXIN) 500 MG tablet Take 500 mg by mouth every 8 (eight) hours as needed for muscle spasms.    . montelukast (SINGULAIR) 10 MG tablet Take 10 mg by mouth at bedtime.     . ondansetron (ZOFRAN-ODT) 4 MG disintegrating tablet Take by mouth.    . oxyCODONE (OXY IR/ROXICODONE) 5 MG immediate release tablet Take by mouth.    . polyethylene glycol (MIRALAX / GLYCOLAX) 17 g packet Take 17 g by mouth daily as needed for mild constipation. 14 each 0  . pregabalin (LYRICA) 75 MG capsule Take 75 mg by mouth daily.    . primidone (MYSOLINE) 50 MG tablet Take 1 tablet by mouth 2 (two) times daily.    Marland Kitchen senna (SENOKOT) 8.6 MG TABS tablet Take 2 tablets (17.2 mg total) by mouth 2 (two) times daily. 120 tablet 0   No current facility-administered medications for this visit.    Past Medical History:  Diagnosis Date  . Arthritis   . Carotid artery stenosis   . Colon polyp   . Degenerative joint disease   . GERD (gastroesophageal reflux disease)   . Hyperlipidemia   . Hypertension   . Hypothyroidism   . Osteoporosis   . Pulmonary emphysema ([place])   .  Skin cancer    lesion removed from nose  . Spinal stenosis   . Thyroid disease     Past Surgical History:  Procedure Laterality Date  . ABDOMINAL EXPOSURE N/A 09/30/2020   Procedure: ABDOMINAL EXPOSURE;  Surgeon: Algernon Huxley, MD;  Location: [facility] ORS;  Service: Vascular;  Laterality: N/A;  . ABDOMINAL HYSTERECTOMY  1993  . ANTERIOR AND POSTERIOR SPINAL FUSION N/A 09/30/2020   Procedure: L5-S1 ANTERIOR LUMBAR INTERBODY FUSION, L3-S1 INSTRUMENTATION;  Surgeon: Meade Maw, MD;  Location: [facility] ORS;  Service: Neurosurgery;  Laterality: N/A;  . APPENDECTOMY  1974  . BACK SURGERY    . BREAST BIOPSY Right 02/15/2021   Q clip, [place] bx, 9:30 5cmfn path pending  . BREAST BIOPSY Left 02/15/2021   x clip, [place] bx, 1:00 2cmfn, path pending  . BREAST EXCISIONAL BIOPSY Right 1985   Benign cycts removed  . CESAREAN SECTION  1993  .  COLONOSCOPY    . LUMBAR FUSION  2010   Dr. March Rummage  . LUMBAR FUSION  2013   x 2 Dr. Ellender Hose  . TONSILLECTOMY    . TUBAL LIGATION  1974  . UPPER GI ENDOSCOPY       Social History   Tobacco Use  . Smoking status: Former Smoker    Packs/day: 1.00    Years: 48.00    Pack years: 48.00    Types: Cigarettes    Quit date: 02/14/2020    Years since quitting: 1.0  . Smokeless tobacco: Never Used  Vaping Use  . Vaping Use: Never used  Substance Use Topics  . Alcohol use: Yes    Alcohol/week: 14.0 standard drinks    Types: 14 Glasses of wine per week  . Drug use: Not Currently    Types: Marijuana    Comment: 2018 tried using      Family History  Problem Relation Age of Onset  . Hypertension Mother   . Heart attack Father   . Lung cancer Brother   . Breast cancer Neg Hx      Allergies  Allergen Reactions  . Demerol [Meperidine Hcl] Nausea And Vomiting    Vomiting lasted for 2 days after taking med  . Lisinopril Cough  . Latex Rash  . Penicillins Rash  . Sulfa Antibiotics Rash     REVIEW OF SYSTEMS (Negative unless checked)  Constitutional: _0 Weight loss  _1 Fever  _2 Chills Cardiac: _3 Chest pain   _4 Chest pressure   _5 Palpitations   _6 Shortness of breath when laying flat   _7 Shortness of breath at rest   _8 Shortness of breath with exertion. Vascular:  _9 Pain in legs with walking   _10 Pain in legs at rest   _11 Pain in legs when laying flat   _12 Claudication   _13 Pain in feet when walking  _14 Pain in feet at rest  _15 Pain in feet when laying flat   _16 History of DVT   _17 Phlebitis   _18 Swelling in legs   _19 Varicose veins   _20 Non-healing ulcers Pulmonary:   _21 Uses home oxygen   _22 Productive cough   _23 Hemoptysis   _24 Wheeze  _25 COPD   _26 Asthma Neurologic:  _27 Dizziness  _28 Blackouts   _29 Seizures   _30 History of stroke   _31 History of TIA  _32 Aphasia   _33 Temporary blindness   _34 Dysphagia   _35 Weakness or numbness in arms   _36 Weakness or numbness in legs Musculoskeletal:  _37 Arthritis   _38 Joint  swelling   _39 Joint pain   _40 Low back pain Hematologic:  _41 Easy bruising  _42 Easy bleeding   _43 Hypercoagulable state   [  x]Anemic  _0 Hepatitis Gastrointestinal:  _1 Blood in stool   _2 Vomiting blood  _3 Gastroesophageal reflux/heartburn   _4 Difficulty swallowing. Genitourinary:  _5 Chronic kidney disease   _6 Difficult urination  _7 Frequent urination  _8 Burning with urination   _9 Blood in urine Skin:  _10 Rashes   _11 Ulcers   _12 Wounds Psychological:  _13 History of anxiety   _14  History of major depression.  Physical Examination  Vitals:   02/16/21 1328  BP: 121/75  Pulse: 78  Weight: 165 lb (74.8 kg)  Height: _15  (1.499 m)   Body mass index is 33.33 kg/m. Gen:  WD/WN, NAD Head: [place]/AT, No temporalis wasting. Ear/Nose/Throat: Hearing grossly intact, nares w/o erythema or drainage, trachea midline Eyes: Conjunctiva clear. Sclera non-icteric Neck: Supple.  No bruit  Pulmonary:  Good air movement, equal and clear to auscultation bilaterally.  Cardiac: RRR, No JVD Vascular:  Vessel Right Left  Radial Palpable Palpable       Musculoskeletal: M/S 5/5 throughout.  No deformity or atrophy.  No edema. Neurologic: CN 2-12 intact. Sensation grossly intact in extremities.  Symmetrical.  Speech is fluent. Motor exam as listed above. Psychiatric: Judgment intact, Mood & affect appropriate for pt's clinical situation. Dermatologic: No rashes or ulcers noted.  No cellulitis or open wounds.      CBC Lab Results  Component Value Date   WBC 7.0 09/21/2020   HGB 11.2 (L) 09/30/2020   HCT 33.0 (L) 09/30/2020   MCV 91.9 09/21/2020   PLT 229 09/21/2020    BMET    Component Value Date/Time   NA 133 (L) 09/30/2020 0633   K 3.4 (L) 09/30/2020 0633   CL 94 (L) 09/30/2020 0633   CO2 26 09/21/2020 1359   GLUCOSE 114 (H) 09/30/2020 0633   BUN 11 09/30/2020 0633   CREATININE 1.00 09/30/2020 0633   CALCIUM 9.5 09/21/2020 1359   GFRNONAA >60 09/21/2020 1359   GFRAA >60 05/15/2019 0935   CrCl  cannot be calculated (Patient's most recent lab result is older than the maximum 21 days allowed.).  COAG Lab Results  Component Value Date   INR 1.0 09/21/2020    Radiology US BREAST LTD UNI LEFT INC AXILLA  Result Date: 02/08/2021 CLINICAL DATA:  Patient returns today to evaluate bilateral breast masses identified on recent screening mammogram. History of breast cysts. EXAM: DIGITAL DIAGNOSTIC BILATERAL MAMMOGRAM WITH TOMOSYNTHESIS AND CAD; ULTRASOUND LEFT BREAST LIMITED; ULTRASOUND RIGHT BREAST LIMITED TECHNIQUE: Bilateral digital diagnostic mammography and breast tomosynthesis was performed. The images were evaluated with computer-aided detection.; Targeted ultrasound examination of the left breast was performed; Targeted ultrasound examination of the right breast was performed COMPARISON:  Previous exams including recent screening mammogram dated 01/25/2021. ACR Breast Density Category b: There are scattered areas of fibroglandular density. FINDINGS: Bilateral diagnostic mammogram: RIGHT breast: On today's additional diagnostic views with spot compression and 3D tomosynthesis, a partially obscured low-density mass is confirmed within the upper-outer quadrant, measuring approximately 5 mm greatest dimension. LEFT breast: On today's additional diagnostic views with spot compression and 3D tomosynthesis, a partially obscured dense mass is confirmed within the upper-outer quadrant, at anterior depth, measuring approximately 4 mm greatest dimension. RIGHT breast: Targeted ultrasound is performed, showing an oval circumscribed hypoechoic mass in the RIGHT breast at the 9:30 o'clock axis, measuring 5 x 4 x 5 mm, without internal vascularity, corresponding to the mammographic finding. LEFT breast: Targeted ultrasound is performed, showing a round hypoechoic mass in the LEFT breast at the 1 o'clock axis, 2 cm from the nipple, measuring 5 mm, with  partially irregular and indistinct margins, questionably taller  than wide, without internal vascularity, corresponding to the mammographic finding. Bilateral axillae were evaluated with ultrasound showing no enlarged or morphologically abnormal lymph nodes. IMPRESSION: 1. Indeterminate hypoechoic mass in the RIGHT breast at the 9:30 o'clock axis, measuring 5 mm, corresponding to the new mass seen on mammogram. This may represent a complicated cyst with internal debris. Ultrasound-guided biopsy is recommended to ensure benignity. 2. Suspicious hypoechoic mass in the LEFT breast at the 1 o'clock axis, 2 cm from the nipple, with partially irregular and indistinct margins, questionably taller than wide, corresponding to the dense mass seen on mammogram. This may also represent a complicated cyst with internal debris. Ultrasound-guided biopsy is recommended to exclude malignancy. RECOMMENDATION: 1. Ultrasound-guided biopsy of the RIGHT breast mass at the 9:30 o'clock axis, measuring 5 mm, corresponding to the new mass seen on mammogram. 2. Ultrasound-guided biopsy of the LEFT breast mass at the 1 o'clock axis, 2 cm from the nipple, measuring 5 mm, corresponding to the dense mass seen on mammogram. Ordering physician will be contacted and ultrasound-guided biopsies will be scheduled at patient's convenience. I have discussed the findings and recommendations with the patient. If applicable, a reminder letter will be sent to the patient regarding the next appointment. BI-RADS CATEGORY  4: Suspicious. Electronically Signed   By: Franki Cabot M.D.   On: 02/08/2021 14:25   US BREAST LTD UNI RIGHT INC AXILLA  Result Date: 02/08/2021 CLINICAL DATA:  Patient returns today to evaluate bilateral breast masses identified on recent screening mammogram. History of breast cysts. EXAM: DIGITAL DIAGNOSTIC BILATERAL MAMMOGRAM WITH TOMOSYNTHESIS AND CAD; ULTRASOUND LEFT BREAST LIMITED; ULTRASOUND RIGHT BREAST LIMITED TECHNIQUE: Bilateral digital diagnostic mammography and breast tomosynthesis was  performed. The images were evaluated with computer-aided detection.; Targeted ultrasound examination of the left breast was performed; Targeted ultrasound examination of the right breast was performed COMPARISON:  Previous exams including recent screening mammogram dated 01/25/2021. ACR Breast Density Category b: There are scattered areas of fibroglandular density. FINDINGS: Bilateral diagnostic mammogram: RIGHT breast: On today's additional diagnostic views with spot compression and 3D tomosynthesis, a partially obscured low-density mass is confirmed within the upper-outer quadrant, measuring approximately 5 mm greatest dimension. LEFT breast: On today's additional diagnostic views with spot compression and 3D tomosynthesis, a partially obscured dense mass is confirmed within the upper-outer quadrant, at anterior depth, measuring approximately 4 mm greatest dimension. RIGHT breast: Targeted ultrasound is performed, showing an oval circumscribed hypoechoic mass in the RIGHT breast at the 9:30 o'clock axis, measuring 5 x 4 x 5 mm, without internal vascularity, corresponding to the mammographic finding. LEFT breast: Targeted ultrasound is performed, showing a round hypoechoic mass in the LEFT breast at the 1 o'clock axis, 2 cm from the nipple, measuring 5 mm, with partially irregular and indistinct margins, questionably taller than wide, without internal vascularity, corresponding to the mammographic finding. Bilateral axillae were evaluated with ultrasound showing no enlarged or morphologically abnormal lymph nodes. IMPRESSION: 1. Indeterminate hypoechoic mass in the RIGHT breast at the 9:30 o'clock axis, measuring 5 mm, corresponding to the new mass seen on mammogram. This may represent a complicated cyst with internal debris. Ultrasound-guided biopsy is recommended to ensure benignity. 2. Suspicious hypoechoic mass in the LEFT breast at the 1 o'clock axis, 2 cm from the nipple, with partially irregular and  indistinct margins, questionably taller than wide, corresponding to the dense mass seen on mammogram. This may also represent a complicated cyst with internal debris. Ultrasound-guided biopsy is  recommended to exclude malignancy. RECOMMENDATION: 1. Ultrasound-guided biopsy of the RIGHT breast mass at the 9:30 o'clock axis, measuring 5 mm, corresponding to the new mass seen on mammogram. 2. Ultrasound-guided biopsy of the LEFT breast mass at the 1 o'clock axis, 2 cm from the nipple, measuring 5 mm, corresponding to the dense mass seen on mammogram. Ordering physician will be contacted and ultrasound-guided biopsies will be scheduled at patient's convenience. I have discussed the findings and recommendations with the patient. If applicable, a reminder letter will be sent to the patient regarding the next appointment. BI-RADS CATEGORY  4: Suspicious. Electronically Signed   By: Franki Cabot M.D.   On: 02/08/2021 14:25   MM DIAG BREAST TOMO BILATERAL  Result Date: 02/08/2021 CLINICAL DATA:  Patient returns today to evaluate bilateral breast masses identified on recent screening mammogram. History of breast cysts. EXAM: DIGITAL DIAGNOSTIC BILATERAL MAMMOGRAM WITH TOMOSYNTHESIS AND CAD; ULTRASOUND LEFT BREAST LIMITED; ULTRASOUND RIGHT BREAST LIMITED TECHNIQUE: Bilateral digital diagnostic mammography and breast tomosynthesis was performed. The images were evaluated with computer-aided detection.; Targeted ultrasound examination of the left breast was performed; Targeted ultrasound examination of the right breast was performed COMPARISON:  Previous exams including recent screening mammogram dated 01/25/2021. ACR Breast Density Category b: There are scattered areas of fibroglandular density. FINDINGS: Bilateral diagnostic mammogram: RIGHT breast: On today's additional diagnostic views with spot compression and 3D tomosynthesis, a partially obscured low-density mass is confirmed within the upper-outer quadrant, measuring  approximately 5 mm greatest dimension. LEFT breast: On today's additional diagnostic views with spot compression and 3D tomosynthesis, a partially obscured dense mass is confirmed within the upper-outer quadrant, at anterior depth, measuring approximately 4 mm greatest dimension. RIGHT breast: Targeted ultrasound is performed, showing an oval circumscribed hypoechoic mass in the RIGHT breast at the 9:30 o'clock axis, measuring 5 x 4 x 5 mm, without internal vascularity, corresponding to the mammographic finding. LEFT breast: Targeted ultrasound is performed, showing a round hypoechoic mass in the LEFT breast at the 1 o'clock axis, 2 cm from the nipple, measuring 5 mm, with partially irregular and indistinct margins, questionably taller than wide, without internal vascularity, corresponding to the mammographic finding. Bilateral axillae were evaluated with ultrasound showing no enlarged or morphologically abnormal lymph nodes. IMPRESSION: 1. Indeterminate hypoechoic mass in the RIGHT breast at the 9:30 o'clock axis, measuring 5 mm, corresponding to the new mass seen on mammogram. This may represent a complicated cyst with internal debris. Ultrasound-guided biopsy is recommended to ensure benignity. 2. Suspicious hypoechoic mass in the LEFT breast at the 1 o'clock axis, 2 cm from the nipple, with partially irregular and indistinct margins, questionably taller than wide, corresponding to the dense mass seen on mammogram. This may also represent a complicated cyst with internal debris. Ultrasound-guided biopsy is recommended to exclude malignancy. RECOMMENDATION: 1. Ultrasound-guided biopsy of the RIGHT breast mass at the 9:30 o'clock axis, measuring 5 mm, corresponding to the new mass seen on mammogram. 2. Ultrasound-guided biopsy of the LEFT breast mass at the 1 o'clock axis, 2 cm from the nipple, measuring 5 mm, corresponding to the dense mass seen on mammogram. Ordering physician will be contacted and  ultrasound-guided biopsies will be scheduled at patient's convenience. I have discussed the findings and recommendations with the patient. If applicable, a reminder letter will be sent to the patient regarding the next appointment. BI-RADS CATEGORY  4: Suspicious. Electronically Signed   By: Franki Cabot M.D.   On: 02/08/2021 14:25   MM 3D SCREEN BREAST BILATERAL  Result Date: 01/28/2021 CLINICAL DATA:  Screening. EXAM: DIGITAL SCREENING BILATERAL MAMMOGRAM WITH TOMOSYNTHESIS AND CAD TECHNIQUE: Bilateral screening digital craniocaudal and mediolateral oblique mammograms were obtained. Bilateral screening digital breast tomosynthesis was performed. The images were evaluated with computer-aided detection. COMPARISON:  Previous exam(s). ACR Breast Density Category b: There are scattered areas of fibroglandular density. FINDINGS: In the right breast a possible mass in the upper outer mid breast requires further evaluation. In the left breast a possible mass in the anterior upper outer breast requires further evaluation. IMPRESSION: Further evaluation is suggested for possible mass in the right breast. Further evaluation is suggested for possible mass in the left breast. RECOMMENDATION: Diagnostic mammogram and possibly ultrasound of both breasts. (Code:[id number]-[id number]) The patient will be contacted regarding the findings, and additional imaging will be scheduled. BI-RADS CATEGORY  0: Incomplete. Need additional imaging evaluation and/or prior mammograms for comparison. Electronically Signed   By: Claudie Revering M.D.   On: 01/28/2021 16:48   MM CLIP PLACEMENT LEFT  Result Date: 02/15/2021 CLINICAL DATA:  Status post ultrasound-guided core biopsy of LEFT breast mass. EXAM: 3D DIAGNOSTIC LEFT MAMMOGRAM POST ULTRASOUND BIOPSY COMPARISON:  Previous exam(s). FINDINGS: 3D Mammographic images were obtained following ultrasound guided biopsy of mass in the 1 o'clock location of the LEFT breast and placement of an X shaped clip.  The biopsy marking clip is in expected position at the site of biopsy. IMPRESSION: Appropriate positioning of the X shaped biopsy marking clip at the site of biopsy in the UPPER-OUTER QUADRANT LEFT breast. Final Assessment: Post Procedure Mammograms for Marker Placement Electronically Signed   By: Nolon Nations M.D.   On: 02/15/2021 10:44   MM CLIP PLACEMENT RIGHT  Result Date: 02/15/2021 CLINICAL DATA:  Status post ultrasound-guided core biopsy of mass in the RIGHT breast. EXAM: 3D DIAGNOSTIC RIGHT MAMMOGRAM POST ULTRASOUND BIOPSY COMPARISON:  Previous exam(s). FINDINGS: 3D Mammographic images were obtained following ultrasound guided biopsy of mass in the 9:30 o'clock location of the RIGHT breast and placement of a Q shaped clip. The biopsy marking clip is in expected position at the site of biopsy. IMPRESSION: Appropriate positioning of the Q shaped biopsy marking clip at the site of biopsy in the UPPER-OUTER QUADRANT RIGHT breast. Final Assessment: Post Procedure Mammograms for Marker Placement Electronically Signed   By: Nolon Nations M.D.   On: 02/15/2021 10:43   [place] LT BREAST BX W LOC DEV 1ST LESION IMG BX SPEC US GUIDE  Result Date: 02/15/2021 CLINICAL DATA:  Patient presents for ultrasound-guided core biopsy of a mass in each breast. EXAM: ULTRASOUND GUIDED BILATERAL BREAST CORE NEEDLE BIOPSY COMPARISON:  Previous exam(s). PROCEDURE: I met with the patient and we discussed the procedure of ultrasound-guided biopsy, including benefits and alternatives. We discussed the high likelihood of a successful procedure. We discussed the risks of the procedure, including infection, bleeding, tissue injury, clip migration, and inadequate sampling. Informed written consent was given. The usual time-out protocol was performed immediately prior to the procedure. Site 1: RIGHT breast. Lesion quadrant: UPPER-OUTER QUADRANT, Q shaped clip Using sterile technique and 1% lidocaine and 1% lidocaine with epinephrine  as local anesthetic, under direct ultrasound visualization, a 12 gauge coaxial spring-loaded device was used to perform biopsy of mass in the 9:30 o'clock location of the RIGHT breast using a inferior to superior approach. At the conclusion of the procedure a Q shaped tissue marker clip was deployed into the biopsy cavity. Site 2: LEFT breast. Lesion quadrant: UPPER OUTER QUADRANT, X shaped clip Using  sterile technique and 1% lidocaine and 1% lidocaine with epinephrine as local anesthetic, under direct ultrasound visualization, a 12 gauge gauge coaxial spring-loaded device was used to perform biopsy of mass in the 1 o'clock location of the LEFT breast using a 9 LATERAL to MEDIAL approach. At the conclusion of the procedure X shaped tissue marker clip was deployed into the biopsy cavity. Follow-up 2-view mammogram was performed and dictated separately. IMPRESSION: Ultrasound guided biopsy of single mass in each breast. No apparent complications. Electronically Signed   By: Nolon Nations M.D.   On: 02/15/2021 10:41   [place] RT BREAST BX W LOC DEV 1ST LESION IMG BX SPEC US GUIDE  Result Date: 02/15/2021 CLINICAL DATA:  Patient presents for ultrasound-guided core biopsy of a mass in each breast. EXAM: ULTRASOUND GUIDED BILATERAL BREAST CORE NEEDLE BIOPSY COMPARISON:  Previous exam(s). PROCEDURE: I met with the patient and we discussed the procedure of ultrasound-guided biopsy, including benefits and alternatives. We discussed the high likelihood of a successful procedure. We discussed the risks of the procedure, including infection, bleeding, tissue injury, clip migration, and inadequate sampling. Informed written consent was given. The usual time-out protocol was performed immediately prior to the procedure. Site 1: RIGHT breast. Lesion quadrant: UPPER-OUTER QUADRANT, Q shaped clip Using sterile technique and 1% lidocaine and 1% lidocaine with epinephrine as local anesthetic, under direct ultrasound visualization, a  12 gauge coaxial spring-loaded device was used to perform biopsy of mass in the 9:30 o'clock location of the RIGHT breast using a inferior to superior approach. At the conclusion of the procedure a Q shaped tissue marker clip was deployed into the biopsy cavity. Site 2: LEFT breast. Lesion quadrant: UPPER OUTER QUADRANT, X shaped clip Using sterile technique and 1% lidocaine and 1% lidocaine with epinephrine as local anesthetic, under direct ultrasound visualization, a 12 gauge gauge coaxial spring-loaded device was used to perform biopsy of mass in the 1 o'clock location of the LEFT breast using a 9 LATERAL to MEDIAL approach. At the conclusion of the procedure X shaped tissue marker clip was deployed into the biopsy cavity. Follow-up 2-view mammogram was performed and dictated separately. IMPRESSION: Ultrasound guided biopsy of single mass in each breast. No apparent complications. Electronically Signed   By: Nolon Nations M.D.   On: 02/15/2021 10:41     Assessment/Plan Essential (primary) hypertension blood pressure control important in reducing the progression of atherosclerotic disease. On appropriate oral medications.   Hyperlipidemia lipid control important in reducing the progression of atherosclerotic disease. Continue statin therapy  Carotid stenosis Her carotid duplex today actually shows 1 to 39% ICA stenosis bilaterally by duplex criteria.  This is slightly better on the left than it was previously and stable on the right. No role for intervention. Continue medical regimen. Recheck in one year.    Leotis Pain, MD  02/16/2021 2:45 PM    This note was created with Dragon medical transcription system.  Any errors from dictation are purely unintentional

## 2021-02-16 NOTE — Assessment & Plan Note (Signed)
Her carotid duplex today actually shows 1 to 39% ICA stenosis bilaterally by duplex criteria.  This is slightly better on the left than it was previously and stable on the right. No role for intervention. Continue medical regimen. Recheck in one year.

## 2021-03-04 ENCOUNTER — Telehealth: Payer: Self-pay

## 2021-03-04 NOTE — Telephone Encounter (Signed)
Patient notified that lung screening imaging is due currently or in the near future. Patient is scheduled for 04/01/21 @ 4:30.  Patient is a former smoker, quit 02/3020

## 2021-03-09 ENCOUNTER — Other Ambulatory Visit: Payer: Self-pay | Admitting: *Deleted

## 2021-03-09 DIAGNOSIS — F17200 Nicotine dependence, unspecified, uncomplicated: Secondary | ICD-10-CM

## 2021-03-09 DIAGNOSIS — Z122 Encounter for screening for malignant neoplasm of respiratory organs: Secondary | ICD-10-CM

## 2021-03-09 DIAGNOSIS — Z87891 Personal history of nicotine dependence: Secondary | ICD-10-CM

## 2021-03-09 NOTE — Progress Notes (Signed)
Contacted and scheduled for annual lung screening scan. Patient is a former smoker, quit 02/2020. 48.5 pack year history.

## 2021-04-01 ENCOUNTER — Ambulatory Visit
Admission: RE | Admit: 2021-04-01 | Discharge: 2021-04-01 | Disposition: A | Discharge status: Home / Self Care | Payer: Medicare Other | Source: Ambulatory Visit | Attending: Nurse Practitioner | Admitting: Nurse Practitioner

## 2021-04-01 ENCOUNTER — Other Ambulatory Visit: Payer: Self-pay

## 2021-04-01 DIAGNOSIS — Z87891 Personal history of nicotine dependence: Secondary | ICD-10-CM | POA: Insufficient documentation

## 2021-04-01 DIAGNOSIS — Z122 Encounter for screening for malignant neoplasm of respiratory organs: Secondary | ICD-10-CM | POA: Diagnosis present

## 2021-04-01 DIAGNOSIS — F17200 Nicotine dependence, unspecified, uncomplicated: Secondary | ICD-10-CM | POA: Insufficient documentation

## 2021-04-12 ENCOUNTER — Encounter: Payer: Self-pay | Admitting: *Deleted

## 2021-04-29 ENCOUNTER — Other Ambulatory Visit (HOSPITAL_COMMUNITY): Payer: Self-pay | Admitting: Neurosurgery

## 2021-04-29 ENCOUNTER — Other Ambulatory Visit: Payer: Self-pay | Admitting: Neurosurgery

## 2021-04-29 DIAGNOSIS — Z981 Arthrodesis status: Secondary | ICD-10-CM

## 2021-05-07 ENCOUNTER — Other Ambulatory Visit: Payer: Self-pay

## 2021-05-07 ENCOUNTER — Ambulatory Visit
Admission: RE | Admit: 2021-05-07 | Discharge: 2021-05-07 | Disposition: A | Discharge status: Home / Self Care | Payer: Medicare Other | Source: Ambulatory Visit | Attending: Neurosurgery | Admitting: Neurosurgery

## 2021-05-07 DIAGNOSIS — Z981 Arthrodesis status: Secondary | ICD-10-CM | POA: Diagnosis present

## 2021-05-24 ENCOUNTER — Other Ambulatory Visit: Payer: Self-pay | Admitting: Neurosurgery

## 2021-05-24 DIAGNOSIS — Z981 Arthrodesis status: Secondary | ICD-10-CM

## 2021-05-24 DIAGNOSIS — G959 Disease of spinal cord, unspecified: Secondary | ICD-10-CM

## 2021-06-01 ENCOUNTER — Ambulatory Visit
Admission: RE | Admit: 2021-06-01 | Discharge: 2021-06-01 | Disposition: A | Discharge status: Home / Self Care | Payer: Medicare Other | Source: Ambulatory Visit | Attending: Neurosurgery | Admitting: Neurosurgery

## 2021-06-01 ENCOUNTER — Other Ambulatory Visit: Payer: Self-pay

## 2021-06-01 DIAGNOSIS — Z981 Arthrodesis status: Secondary | ICD-10-CM

## 2021-06-01 DIAGNOSIS — G959 Disease of spinal cord, unspecified: Secondary | ICD-10-CM | POA: Diagnosis not present

## 2021-06-18 ENCOUNTER — Other Ambulatory Visit: Payer: Self-pay | Admitting: Neurosurgery

## 2021-07-01 ENCOUNTER — Encounter: Payer: Self-pay | Admitting: Neurosurgery

## 2021-07-01 ENCOUNTER — Other Ambulatory Visit: Payer: Self-pay

## 2021-07-01 ENCOUNTER — Encounter
Admission: RE | Admit: 2021-07-01 | Discharge: 2021-07-01 | Disposition: A | Discharge status: Home / Self Care | Payer: Medicare Other | Source: Ambulatory Visit | Attending: Neurosurgery | Admitting: Neurosurgery

## 2021-07-01 DIAGNOSIS — Z01818 Encounter for other preprocedural examination: Secondary | ICD-10-CM | POA: Insufficient documentation

## 2021-07-01 LAB — TYPE AND SCREEN
ABO/RH(D): O POS
Antibody Screen: NEGATIVE

## 2021-07-01 LAB — CBC
HCT: 31.9 % — ABNORMAL LOW (ref 36.0–46.0)
Hemoglobin: 11.5 g/dL — ABNORMAL LOW (ref 12.0–15.0)
MCH: 33.1 pg (ref 26.0–34.0)
MCHC: 36.1 g/dL — ABNORMAL HIGH (ref 30.0–36.0)
MCV: 91.9 fL (ref 80.0–100.0)
Platelets: 198 10*3/uL (ref 150–400)
RBC: 3.47 MIL/uL — ABNORMAL LOW (ref 3.87–5.11)
RDW: 12.3 % (ref 11.5–15.5)
WBC: 5.6 10*3/uL (ref 4.0–10.5)
nRBC: 0 % (ref 0.0–0.2)

## 2021-07-01 LAB — BASIC METABOLIC PANEL
Anion gap: 12 (ref 5–15)
BUN: 10 mg/dL (ref 8–23)
CO2: 27 mmol/L (ref 22–32)
Calcium: 8.9 mg/dL (ref 8.9–10.3)
Chloride: 90 mmol/L — ABNORMAL LOW (ref 98–111)
Creatinine, Ser: 0.93 mg/dL (ref 0.44–1.00)
GFR, Estimated: >60 mL/min (ref >60)
Glucose, Bld: 118 mg/dL — ABNORMAL HIGH (ref 70–99)
Potassium: 3.3 mmol/L — ABNORMAL LOW (ref 3.5–5.1)
Sodium: 129 mmol/L — ABNORMAL LOW (ref 135–145)

## 2021-07-01 LAB — SURGICAL PCR SCREEN
MRSA, PCR: NEGATIVE
Staphylococcus aureus: NEGATIVE

## 2021-07-01 NOTE — Patient Instructions (Addendum)
Your procedure is scheduled on: 07/09/21 Report to [facility]. To find out your arrival time please call [phone] between 1PM - 3PM on 07/08/21.  Remember: Instructions that are not followed completely may result in serious medical risk, up to and including death, or upon the discretion of your surgeon and anesthesiologist your surgery may need to be rescheduled.     _X__ 1. Do not eat food after midnight the night before your procedure.                 No gum chewing or hard candies. You may drink clear liquids up to 2 hours                 before you are scheduled to arrive for your surgery- DO not drink clear                 liquids within 2 hours of the start of your surgery.                 Clear Liquids include:  water, apple juice without pulp, clear carbohydrate                 drink such as Clearfast or Gatorade, Black Coffee or Tea (Do not add                 anything to coffee or tea). Diabetics water only  __X__2.  On the morning of surgery brush your teeth with toothpaste and water, you                 may rinse your mouth with mouthwash if you wish.  Do not swallow any              toothpaste of mouthwash.     _X__ 3.  No Alcohol for 24 hours before or after surgery.   _X__ 4.  Do Not Smoke or use e-cigarettes For 24 Hours Prior to Your Surgery.                 Do not use any chewable tobacco products for at least 6 hours prior to                 surgery.  ____  5.  Bring all medications with you on the day of surgery if instructed.   __X__  6.  Notify your doctor if there is any change in your medical condition      (cold, fever, infections).     Do not wear jewelry, make-up, hairpins, clips or nail polish. Do not wear lotions, powders, or perfumes.  Do not shave 48 hours prior to surgery. Men may shave face and neck. Do not bring valuables to the hospital.    [facility] is not responsible for any belongings or  valuables.  Contacts, dentures/partials or body piercings may not be worn into surgery. Bring a case for your contacts, glasses or hearing aids, a denture cup will be supplied. Leave your suitcase in the car. After surgery it may be brought to your room. For patients admitted to the hospital, discharge time is determined by your treatment team.   Patients discharged the day of surgery will not be allowed to drive home.   Please read over the following fact sheets that you were given:   MRSA Information, CHG soap, Spine surgery Patient guide  __X__ Take these medicines the morning of surgery with  A SIP OF WATER:    1. atorvastatin (LIPITOR) 20 MG tablet  2. buPROPion (WELLBUTRIN XL) 150 MG 24 hr tablet  3. esomeprazole (NEXIUM) 20 MG capsule  4. isosorbide mononitrate (IMDUR) 30 MG 24 hr tablet  5. pregabalin (LYRICA) 75 MG capsule  6.  ____ Fleet Enema (as directed)   __X__ Use CHG Soap/SAGE wipes as directed  ____ Use inhalers on the day of surgery  ____ Stop metformin/Janumet/Farxiga 2 days prior to surgery    ____ Take 1/2 of usual insulin dose the night before surgery. No insulin the morning          of surgery.   ____ Stop Blood Thinners Coumadin/Plavix/Xarelto/Pleta/Pradaxa/Eliquis/Effient/Aspirin  on   Or contact your Surgeon, Cardiologist or Medical Doctor regarding  ability to stop your blood thinners  __X__ Stop Anti-inflammatories 7 days before surgery such as Advil, Ibuprofen, Motrin,  BC or Goodies Powder, Naprosyn, Naproxen, Aleve, Aspirin    __X__ Stop all herbal supplements, fish oil or vitamin E until after surgery.    ____ Bring C-Pap to the hospital.

## 2021-07-08 MED ORDER — CHLORHEXIDINE GLUCONATE 0.12 % MT SOLN: 15.0000 mL | Freq: Once | OROMUCOSAL | Status: AC

## 2021-07-08 MED ORDER — LACTATED RINGERS IV SOLN: INTRAVENOUS | Status: DC

## 2021-07-08 MED ORDER — ORAL CARE MOUTH RINSE: 15.0000 mL | Freq: Once | OROMUCOSAL | Status: AC

## 2021-07-08 MED ORDER — CEFAZOLIN SODIUM-DEXTROSE 2-4 GM/100ML-% IV SOLN
2.0000 g | INTRAVENOUS | Status: AC
  Administered 2021-07-09: 2 g via INTRAVENOUS

## 2021-07-08 NOTE — Progress Notes (Signed)
Pharmacy Antibiotic Note  Carol Carey is a 70 y.o. female admitted on (Not on file) with surgical prophylaxis.  Pharmacy has been consulted for Cefazolin dosing.  Plan: Cefazolin 2 gm IV X 1 60 min pre-op ordered for 8/5 @ 0500.      No data recorded.  No results for input(s): WBC, CREATININE, LATICACIDVEN, VANCOTROUGH, VANCOPEAK, VANCORANDOM, GENTTROUGH, GENTPEAK, GENTRANDOM, TOBRATROUGH, TOBRAPEAK, TOBRARND, AMIKACINPEAK, AMIKACINTROU, AMIKACIN in the last 168 hours.  Estimated Creatinine Clearance: 50.3 mL/min (by C-G formula based on SCr of 0.93 mg/dL).    Allergies  Allergen Reactions   Demerol [Meperidine Hcl] Nausea And Vomiting    Vomiting lasted for 2 days after taking med   Lisinopril Cough   Latex Rash   Penicillins Rash   Sulfa Antibiotics Rash    Antimicrobials this admission:   >>    >>   Dose adjustments this admission:   Microbiology results:  BCx:   UCx:    Sputum:    MRSA PCR:   Thank you for allowing pharmacy to be a part of this patient's care.  Abeer Iversen D 07/08/2021 10:49 PM

## 2021-07-09 ENCOUNTER — Ambulatory Visit: Payer: Medicare Other | Admitting: Urgent Care

## 2021-07-09 ENCOUNTER — Encounter
Admission: RE | Disposition: A | Discharge status: Home / Self Care | Payer: Self-pay | Source: Home / Self Care | Attending: Neurosurgery

## 2021-07-09 ENCOUNTER — Ambulatory Visit
Admission: RE | Admit: 2021-07-09 | Discharge: 2021-07-09 | Disposition: A | Discharge status: Home / Self Care | Payer: Medicare Other | Attending: Neurosurgery | Admitting: Neurosurgery

## 2021-07-09 ENCOUNTER — Encounter: Payer: Self-pay | Admitting: Neurosurgery

## 2021-07-09 ENCOUNTER — Ambulatory Visit: Payer: Medicare Other

## 2021-07-09 ENCOUNTER — Other Ambulatory Visit: Payer: Self-pay

## 2021-07-09 DIAGNOSIS — Z981 Arthrodesis status: Secondary | ICD-10-CM | POA: Diagnosis not present

## 2021-07-09 DIAGNOSIS — Z87891 Personal history of nicotine dependence: Secondary | ICD-10-CM | POA: Diagnosis not present

## 2021-07-09 DIAGNOSIS — Z888 Allergy status to other drugs, medicaments and biological substances status: Secondary | ICD-10-CM | POA: Diagnosis not present

## 2021-07-09 DIAGNOSIS — Z880 Allergy status to penicillin: Secondary | ICD-10-CM | POA: Insufficient documentation

## 2021-07-09 DIAGNOSIS — G9589 Other specified diseases of spinal cord: Secondary | ICD-10-CM | POA: Diagnosis present

## 2021-07-09 DIAGNOSIS — Z882 Allergy status to sulfonamides status: Secondary | ICD-10-CM | POA: Diagnosis not present

## 2021-07-09 DIAGNOSIS — Z885 Allergy status to narcotic agent status: Secondary | ICD-10-CM | POA: Diagnosis not present

## 2021-07-09 DIAGNOSIS — M2578 Osteophyte, vertebrae: Secondary | ICD-10-CM | POA: Diagnosis not present

## 2021-07-09 DIAGNOSIS — M4802 Spinal stenosis, cervical region: Secondary | ICD-10-CM | POA: Insufficient documentation

## 2021-07-09 DIAGNOSIS — Z419 Encounter for procedure for purposes other than remedying health state, unspecified: Secondary | ICD-10-CM

## 2021-07-09 HISTORY — DX: Thoracic aortic ectasia: I77.810

## 2021-07-09 HISTORY — DX: Atherosclerosis of aorta: I70.0

## 2021-07-09 HISTORY — PX: ANTERIOR CERVICAL DECOMP/DISCECTOMY FUSION: SHX1161

## 2021-07-09 HISTORY — DX: Other seasonal allergic rhinitis: J30.2

## 2021-07-09 HISTORY — DX: Atherosclerotic heart disease of native coronary artery without angina pectoris: I25.10

## 2021-07-09 HISTORY — DX: Chronic obstructive pulmonary disease, unspecified: J44.9

## 2021-07-09 HISTORY — DX: Sleep apnea, unspecified: G47.30

## 2021-07-09 HISTORY — DX: Endocarditis, valve unspecified: I38

## 2021-07-09 HISTORY — DX: Fatty (change of) liver, not elsewhere classified: K76.0

## 2021-07-09 SURGERY — ANTERIOR CERVICAL DECOMPRESSION/DISCECTOMY FUSION 1 LEVEL
Anesthesia: General

## 2021-07-09 MED ORDER — SENNA 8.6 MG PO TABS: 1.0000 | ORAL_TABLET | Freq: Every evening | ORAL | 0 refills | Status: DC | PRN

## 2021-07-09 MED ORDER — LACTATED RINGERS IV SOLN: INTRAVENOUS | Status: DC | PRN

## 2021-07-09 MED ORDER — SUCCINYLCHOLINE CHLORIDE 200 MG/10ML IV SOSY
PREFILLED_SYRINGE | INTRAVENOUS | Status: DC | PRN
  Administered 2021-07-09: 100 mg via INTRAVENOUS

## 2021-07-09 MED ORDER — PROPOFOL 1000 MG/100ML IV EMUL
INTRAVENOUS | Status: AC
  Filled 2021-07-09: qty 100

## 2021-07-09 MED ORDER — GLYCOPYRROLATE 0.2 MG/ML IJ SOLN
INTRAMUSCULAR | Status: DC | PRN
  Administered 2021-07-09: .2 mg via INTRAVENOUS

## 2021-07-09 MED ORDER — REMIFENTANIL HCL 1 MG IV SOLR
INTRAVENOUS | Status: AC
  Filled 2021-07-09: qty 1000

## 2021-07-09 MED ORDER — BUPIVACAINE-EPINEPHRINE (PF) 0.5% -1:200000 IJ SOLN
INTRAMUSCULAR | Status: DC | PRN
  Administered 2021-07-09: 7 mL

## 2021-07-09 MED ORDER — ACETAMINOPHEN 10 MG/ML IV SOLN
INTRAVENOUS | Status: AC
  Filled 2021-07-09: qty 100

## 2021-07-09 MED ORDER — ACETAMINOPHEN 10 MG/ML IV SOLN: 1000.0000 mg | Freq: Once | INTRAVENOUS | Status: DC | PRN

## 2021-07-09 MED ORDER — DROPERIDOL 2.5 MG/ML IJ SOLN
0.6250 mg | Freq: Once | INTRAMUSCULAR | Status: DC | PRN
  Filled 2021-07-09: qty 2

## 2021-07-09 MED ORDER — HEMOSTATIC AGENTS (NO CHARGE) OPTIME
TOPICAL | Status: DC | PRN
  Administered 2021-07-09: 1 via TOPICAL

## 2021-07-09 MED ORDER — PHENYLEPHRINE HCL (PRESSORS) 10 MG/ML IV SOLN
INTRAVENOUS | Status: DC | PRN
  Administered 2021-07-09: 100 ug via INTRAVENOUS

## 2021-07-09 MED ORDER — FENTANYL CITRATE (PF) 100 MCG/2ML IJ SOLN
25.0000 ug | INTRAMUSCULAR | Status: DC | PRN
  Administered 2021-07-09: 50 ug via INTRAVENOUS

## 2021-07-09 MED ORDER — LIDOCAINE HCL (CARDIAC) PF 100 MG/5ML IV SOSY
PREFILLED_SYRINGE | INTRAVENOUS | Status: DC | PRN
  Administered 2021-07-09: 80 mg via INTRAVENOUS

## 2021-07-09 MED ORDER — OXYCODONE HCL 5 MG PO TABS: 5.0000 mg | ORAL_TABLET | Freq: Four times a day (QID) | ORAL | 0 refills | Status: AC | PRN

## 2021-07-09 MED ORDER — MIDAZOLAM HCL 2 MG/2ML IJ SOLN
INTRAMUSCULAR | Status: AC
  Filled 2021-07-09: qty 2

## 2021-07-09 MED ORDER — THROMBIN 5000 UNITS EX SOLR
CUTANEOUS | Status: DC | PRN
  Administered 2021-07-09: 5000 [IU] via TOPICAL

## 2021-07-09 MED ORDER — FENTANYL CITRATE (PF) 100 MCG/2ML IJ SOLN
INTRAMUSCULAR | Status: DC | PRN
  Administered 2021-07-09 (×2): 50 ug via INTRAVENOUS

## 2021-07-09 MED ORDER — ACETAMINOPHEN 10 MG/ML IV SOLN
INTRAVENOUS | Status: DC | PRN
  Administered 2021-07-09: 1000 mg via INTRAVENOUS

## 2021-07-09 MED ORDER — MIDAZOLAM HCL 2 MG/2ML IJ SOLN
INTRAMUSCULAR | Status: DC | PRN
  Administered 2021-07-09: 2 mg via INTRAVENOUS

## 2021-07-09 MED ORDER — FENTANYL CITRATE (PF) 100 MCG/2ML IJ SOLN
INTRAMUSCULAR | Status: AC
  Administered 2021-07-09: 50 ug via INTRAVENOUS
  Filled 2021-07-09: qty 2

## 2021-07-09 MED ORDER — CEFAZOLIN SODIUM-DEXTROSE 2-4 GM/100ML-% IV SOLN
INTRAVENOUS | Status: AC
  Filled 2021-07-09: qty 100

## 2021-07-09 MED ORDER — DEXAMETHASONE SODIUM PHOSPHATE 10 MG/ML IJ SOLN
INTRAMUSCULAR | Status: DC | PRN
  Administered 2021-07-09: 10 mg via INTRAVENOUS

## 2021-07-09 MED ORDER — OXYCODONE HCL 5 MG PO TABS
5.0000 mg | ORAL_TABLET | Freq: Once | ORAL | Status: AC | PRN
Start: 2021-07-09 — End: 2021-07-09
  Administered 2021-07-09: 5 mg via ORAL

## 2021-07-09 MED ORDER — PROMETHAZINE HCL 25 MG/ML IJ SOLN
INTRAMUSCULAR | Status: AC
  Filled 2021-07-09: qty 1

## 2021-07-09 MED ORDER — SODIUM CHLORIDE 0.9 % IV SOLN
INTRAVENOUS | Status: DC | PRN
  Administered 2021-07-09: .08 ug/kg/min via INTRAVENOUS
  Administered 2021-07-09: .1 ug/kg/min via INTRAVENOUS

## 2021-07-09 MED ORDER — PHENYLEPHRINE HCL (PRESSORS) 10 MG/ML IV SOLN
INTRAVENOUS | Status: AC
  Filled 2021-07-09: qty 1

## 2021-07-09 MED ORDER — PROPOFOL 500 MG/50ML IV EMUL
INTRAVENOUS | Status: DC | PRN
  Administered 2021-07-09: 150 ug/kg/min via INTRAVENOUS

## 2021-07-09 MED ORDER — OXYCODONE HCL 5 MG PO TABS
ORAL_TABLET | ORAL | Status: AC
  Filled 2021-07-09: qty 1

## 2021-07-09 MED ORDER — OXYCODONE HCL 5 MG/5ML PO SOLN: 5.0000 mg | Freq: Once | ORAL | Status: AC | PRN

## 2021-07-09 MED ORDER — SODIUM CHLORIDE 0.9 % IV SOLN
INTRAVENOUS | Status: DC | PRN
  Administered 2021-07-09: 20 ug/min via INTRAVENOUS

## 2021-07-09 MED ORDER — PROPOFOL 10 MG/ML IV BOLUS
INTRAVENOUS | Status: DC | PRN
  Administered 2021-07-09: 150 mg via INTRAVENOUS
  Administered 2021-07-09: 30 mg via INTRAVENOUS
  Administered 2021-07-09: 20 mg via INTRAVENOUS

## 2021-07-09 MED ORDER — 0.9 % SODIUM CHLORIDE (POUR BTL) OPTIME
TOPICAL | Status: DC | PRN
  Administered 2021-07-09: 1000 mL

## 2021-07-09 MED ORDER — PROMETHAZINE HCL 25 MG/ML IJ SOLN
6.2500 mg | INTRAMUSCULAR | Status: DC | PRN
Start: 2021-07-09 — End: 2021-07-09
  Administered 2021-07-09: 6.25 mg via INTRAVENOUS

## 2021-07-09 MED ORDER — FENTANYL CITRATE (PF) 100 MCG/2ML IJ SOLN
INTRAMUSCULAR | Status: AC
  Filled 2021-07-09: qty 2

## 2021-07-09 MED ORDER — ONDANSETRON HCL 4 MG/2ML IJ SOLN
INTRAMUSCULAR | Status: DC | PRN
  Administered 2021-07-09: 4 mg via INTRAVENOUS

## 2021-07-09 MED ORDER — CHLORHEXIDINE GLUCONATE 0.12 % MT SOLN
OROMUCOSAL | Status: AC
  Administered 2021-07-09: 15 mL via OROMUCOSAL
  Filled 2021-07-09: qty 15

## 2021-07-09 SURGICAL SUPPLY — 68 items
ADH SKN CLS APL DERMABOND .7 (GAUZE/BANDAGES/DRESSINGS) ×1
AGENT HMST MTR 8 SURGIFLO (HEMOSTASIS) ×1
APL PRP STRL LF DISP 70% ISPRP (MISCELLANEOUS) ×2
BASKET BONE COLLECTION (BASKET) IMPLANT
BULB RESERV EVAC DRAIN JP 100C (MISCELLANEOUS) IMPLANT
BUR NEURO DRILL SOFT 3.0X3.8M (BURR) ×2 IMPLANT
CHLORAPREP W/TINT 26 (MISCELLANEOUS) ×4 IMPLANT
COUNTER NEEDLE 20/40 LG (NEEDLE) ×2 IMPLANT
CUP MEDICINE 2OZ PLAST GRAD ST (MISCELLANEOUS) ×2 IMPLANT
DERMABOND ADVANCED (GAUZE/BANDAGES/DRESSINGS) ×1
DERMABOND ADVANCED .7 DNX12 (GAUZE/BANDAGES/DRESSINGS) ×1 IMPLANT
DRAIN CHANNEL JP 10F RND 20C F (MISCELLANEOUS) IMPLANT
DRAPE C ARM PK CFD 31 SPINE (DRAPES) ×2 IMPLANT
DRAPE LAPAROTOMY 77X122 PED (DRAPES) ×2 IMPLANT
DRAPE MICROSCOPE SPINE 48X150 (DRAPES) ×2 IMPLANT
DRAPE SURG 17X11 SM STRL (DRAPES) ×8 IMPLANT
DRSG TEGADERM 6X8 (GAUZE/BANDAGES/DRESSINGS) ×1 IMPLANT
ELECT CAUTERY BLADE TIP 2.5 (TIP) ×2
ELECT REM PT RETURN 9FT ADLT (ELECTROSURGICAL) ×2
ELECTRODE CAUTERY BLDE TIP 2.5 (TIP) ×1 IMPLANT
ELECTRODE REM PT RTRN 9FT ADLT (ELECTROSURGICAL) ×1 IMPLANT
FEE INTRAOP CADWELL SUPPLY NCS (MISCELLANEOUS) ×1 IMPLANT
FEE INTRAOP MONITOR IMPULS NCS (MISCELLANEOUS) IMPLANT
GAUZE 4X4 16PLY ~~LOC~~+RFID DBL (SPONGE) ×2 IMPLANT
GLOVE SURG SYN 6.5 ES PF (GLOVE) ×2 IMPLANT
GLOVE SURG SYN 6.5 PF PI (GLOVE) ×1 IMPLANT
GLOVE SURG SYN 8.5  E (GLOVE) ×6
GLOVE SURG SYN 8.5 E (GLOVE) ×3 IMPLANT
GLOVE SURG SYN 8.5 PF PI (GLOVE) ×3 IMPLANT
GLOVE SURG UNDER POLY LF SZ6.5 (GLOVE) ×2 IMPLANT
GOWN SRG LRG LVL 4 IMPRV REINF (GOWNS) ×1 IMPLANT
GOWN SRG XL LVL 3 NONREINFORCE (GOWNS) ×1 IMPLANT
GOWN STRL NON-REIN TWL XL LVL3 (GOWNS) ×2
GOWN STRL REIN LRG LVL4 (GOWNS) ×2
GOWN STRL REUS W/ TWL XL LVL3 (GOWN DISPOSABLE) ×1 IMPLANT
GOWN STRL REUS W/TWL XL LVL3 (GOWN DISPOSABLE) ×2
GRADUATE 1200CC STRL 31836 (MISCELLANEOUS) ×2 IMPLANT
INTRAOP CADWELL SUPPLY FEE NCS (MISCELLANEOUS) ×1
INTRAOP DISP SUPPLY FEE NCS (MISCELLANEOUS) ×2
INTRAOP MONITOR FEE IMPULS NCS (MISCELLANEOUS) ×1
INTRAOP MONITOR FEE IMPULSE (MISCELLANEOUS) ×2
KIT TURNOVER KIT A (KITS) ×2 IMPLANT
MANIFOLD NEPTUNE II (INSTRUMENTS) ×2 IMPLANT
MARKER SKIN DUAL TIP RULER LAB (MISCELLANEOUS) ×4 IMPLANT
NDL SAFETY ECLIPSE 18X1.5 (NEEDLE) ×1 IMPLANT
NEEDLE HYPO 18GX1.5 SHARP (NEEDLE) ×2
NEEDLE HYPO 22GX1.5 SAFETY (NEEDLE) ×2 IMPLANT
NS IRRIG 1000ML POUR BTL (IV SOLUTION) ×2 IMPLANT
PACK LAMINECTOMY NEURO (CUSTOM PROCEDURE TRAY) ×2 IMPLANT
PAD ARMBOARD 7.5X6 YLW CONV (MISCELLANEOUS) ×2 IMPLANT
PIN CASPAR 14 (PIN) ×1 IMPLANT
PIN CASPAR 14MM (PIN) ×2 IMPLANT
PLATE ANT CERV XTEND 1 LV 10 (Plate) ×1 IMPLANT
PUTTY DBX 1CC (Putty) ×2 IMPLANT
PUTTY DBX 1CC DEPUY (Putty) IMPLANT
SCREW VAR 4.2 XD SELF DRILL 16 (Screw) ×4 IMPLANT
SPACER C HEDRON 12X14 7M 7D (Spacer) ×1 IMPLANT
SPOGE SURGIFLO 8M (HEMOSTASIS) ×2
SPONGE KITTNER 5P (MISCELLANEOUS) ×2 IMPLANT
SPONGE SURGIFLO 8M (HEMOSTASIS) ×1 IMPLANT
STAPLER SKIN PROX 35W (STAPLE) IMPLANT
SUT V-LOC 90 ABS DVC 3-0 CL (SUTURE) ×2 IMPLANT
SUT VIC AB 3-0 SH 8-18 (SUTURE) ×2 IMPLANT
SYR 30ML LL (SYRINGE) ×2 IMPLANT
TAPE CLOTH 3X10 WHT NS LF (GAUZE/BANDAGES/DRESSINGS) ×2 IMPLANT
TOWEL OR 17X26 4PK STRL BLUE (TOWEL DISPOSABLE) ×6 IMPLANT
TRAY FOLEY MTR SLVR 16FR STAT (SET/KITS/TRAYS/PACK) IMPLANT
TUBING CONNECTING 10 (TUBING) ×2 IMPLANT

## 2021-07-09 NOTE — Op Note (Signed)
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 she failed conservative management, and elected to proceed with surgery.  Findings: anterolisthesis C4-5  Preoperative Diagnosis: Cervical myelopathy Postoperative Diagnosis: same   EBL: 20 ml IVF: 600 ml Drains: none Disposition: Extubated and Stable to PACU Complications: none  No foley catheter was placed.   Preoperative Note:   Risks of surgery discussed include: infection, bleeding, stroke, coma, death, paralysis, CSF leak, nerve/spinal cord injury, numbness, tingling, weakness, complex regional pain syndrome, recurrent stenosis and/or disc herniation, vascular injury, development of instability, neck/back pain, need for further surgery, persistent symptoms, development of deformity, and the risks of anesthesia. The patient understood these risks and agreed to proceed.  Operative Note:   Procedure:  1) Anterior cervical diskectomy and fusion at C4-5 2) Anterior cervical instrumentation at C4-5 using Globus Xtend 3) Insertion of biomechanical device at C4-5   Procedure: After obtaining informed consent, the patient taken to the operating room, placed in supine position, general anesthesia induced.  The patient had a small shoulder roll placed behind their shoulders.  The patient received preop antibiotics and IV Decadron.  The patient had a neck incision outlined, was prepped and draped in usual sterile fashion. The incision was injected with local anesthetic.   An incision was opened, dissection taken down medial to the carotid artery and jugular vein, lateral to the trachea and esophagus.  The prevertebral fascia was identified, and a localizing x-ray demonstrated the correct level.  The longus colli were dissected laterally, and self-retaining retractors placed to open the operative field. The microscope was then brought into the field.  With this complete, distractor pins were placed in the vertebral bodies of  C4 and C5. The distractor was placed, and the annulus at C4/5 was opened using a bovie.  Curettes and pituitary rongeurs used to remove the majority of disk, then the drill was used to remove the posterior osteophyte and begin the foraminotomies. The nerve hook was used to elevate the posterior longitudinal ligament, which was then removed with Kerrison rongeurs. Bilateral foraminotomies were performed. The microblunt nerve hook could be passed out the foramen bilaterally.   Meticulous hemostasis was obtained.  A biomechanical device (Globus Hedron C 7 mm height x 14 mm width by 12 mm depth) was placed at C4/5. The device had been filled with allograft for aid in arthrodesis.  The caspar distractor was removed, and bone wax used for hemostasis. A 10 mm Globus Xtend plate was chosen.  Two screws placed in each vertebral body, respectively making sure the screws were behind the locking mechanism.  Final AP and lateral radiographs were taken.   With everything in good position, the wound was irrigated copiously with bacitracin-containing solution and meticulous hemostasis obtained.  Wound was closed in 2 layers using interrupted inverted 3-0 Vicryl sutures.  The wound was dressed with dermabond, the head of bed at 30 degrees, taken to recovery room in stable condition.  No new postop neurological deficits were identified.  Sponge and pattie counts were correct at the end of the procedure.   I performed the entire procedure with the assistance of Cooper Render PA as an Pensions consultant.  Meade Maw MD

## 2021-07-09 NOTE — Discharge Summary (Signed)
Physician Discharge Summary  Patient ID: Carol Carey MRN: [id number] DOB/AGE: [birth year] 70 y.o.  Admit date: 07/09/2021 Discharge date: 07/09/2021  Admission Diagnoses: Cervical stenosis with cervical myelopathy   Discharge Diagnoses:  Active Problems:   * No active hospital problems. *   Discharged Condition: good  Hospital Course: Carol Carey is a 70 y.o female s/p C4-5 ACDF. Her hospital course was uncomplicated and she was discharged home on POD#0.   Consults: None  Significant Diagnostic Studies: None  Treatments: IV hydration and analgesia: acetaminophen and Vicodin  Discharge Exam: Blood pressure (!) 159/78, pulse 74, temperature (!) 97.2 F (36.2 C), resp. rate 18, height '4\' 11"'$  (1.499 m), weight 75.3 kg, SpO2 94 %. General appearance: cooperative and appears stated age Neurologic: Grossly normal CN grossly intact Strength: 4+/5 throughout BUE 5/5 throughout BLE.   Disposition: Discharge disposition: 01-Home or Self Care       Allergies as of 07/09/2021       Reactions   Demerol [meperidine Hcl] Nausea And Vomiting   Vomiting lasted for 2 days after taking med   Latex Rash   Lisinopril Cough   Penicillins Rash   Sulfa Antibiotics Rash        Medication List     TAKE these medications    alendronate 70 MG tablet Commonly known as: FOSAMAX Take 70 mg by mouth every Friday. Take with a full glass of water on an empty stomach.   atenolol 50 MG tablet Commonly known as: TENORMIN Take 50 mg by mouth at bedtime.   atorvastatin 20 MG tablet Commonly known as: LIPITOR Take 20 mg by mouth daily.   buPROPion 150 MG 24 hr tablet Commonly known as: WELLBUTRIN XL Take 150 mg by mouth daily.   CALCIUM-VITAMIN D PO Take 1 tablet by mouth daily.   cyclobenzaprine 10 MG tablet Commonly known as: FLEXERIL Take 10 mg by mouth 3 (three) times daily as needed for muscle spasms.   esomeprazole 20 MG capsule Commonly known as: NEXIUM Take 20 mg by  mouth at bedtime.   fluticasone 50 MCG/ACT nasal spray Commonly known as: FLONASE Place 1 spray into the nose daily as needed for allergies.   hydrochlorothiazide 25 MG tablet Commonly known as: HYDRODIURIL Take 25 mg by mouth daily.   hydrocortisone 2.5 % cream Apply 1 application topically 2 (two) times daily as needed (hemorrhoids).   isosorbide mononitrate 30 MG 24 hr tablet Commonly known as: IMDUR Take 30 mg by mouth daily.   levothyroxine 100 MCG tablet Commonly known as: SYNTHROID Take 100 mcg by mouth at bedtime.   losartan 100 MG tablet Commonly known as: COZAAR Take 100 mg by mouth daily.   oxyCODONE 5 MG immediate release tablet Commonly known as: Roxicodone Take 1 tablet (5 mg total) by mouth every 6 (six) hours as needed for up to 5 days for severe pain.   Potassium 99 MG Tabs Take 99 mg by mouth daily.   pregabalin 75 MG capsule Commonly known as: LYRICA Take 75 mg by mouth daily.   senna 8.6 MG Tabs tablet Commonly known as: SENOKOT Take 1 tablet (8.6 mg total) by mouth at bedtime as needed for mild constipation.       ASK your doctor about these medications    acetaminophen 500 MG tablet Commonly known as: TYLENOL Take 2 tablets (1,000 mg total) by mouth every 6 (six) hours.   docusate sodium 100 MG capsule Commonly known as: COLACE Take 1 capsule (100 mg total)  by mouth 2 (two) times daily.   montelukast 10 MG tablet Commonly known as: SINGULAIR Take 10 mg by mouth at bedtime.        Follow-up Information     Loleta Dicker, PA Follow up on 07/22/2021.   Why: for incision check @ 1:30 pm Contact information: [place] [address] [phone]                 Signed: Loleta Dicker 07/09/2021, 10:12 AM

## 2021-07-09 NOTE — Discharge Instructions (Addendum)
Your surgeon has performed an operation on your cervical spine (neck) to relieve pressure on the spinal cord and/or nerves. This involved making an incision in the front of your neck and removing one or more of the discs that support your spine. Next, a small piece of bone, a titanium plate, and screws were used to fuse two or more of the vertebrae (bones) together.  The following are instructions to help in your recovery once you have been discharged from the hospital. Even if you feel well, it is important that you follow these activity guidelines. If you do not let your neck heal properly from the surgery, you can increase the chance of return of your symptoms and other complications.  * Do not take anti-inflammatory medications for 3 months after surgery (naproxen [Aleve], ibuprofen [Advil, Motrin]. These medications can prevent your bones from healing properly.  Activity    No bending, lifting, or twisting ("BLT"). Avoid lifting objects heavier than 10 pounds (gallon milk jug).  Where possible, avoid household activities that involve lifting, bending, reaching, pushing, or pulling such as laundry, vacuuming, grocery shopping, and childcare. Try to arrange for help from friends and family for these activities while your back heals.  Increase physical activity slowly as tolerated.  Taking short walks is encouraged, but avoid strenuous exercise. Do not jog, run, bicycle, lift weights, or participate in any other exercises unless specifically allowed by your doctor.  Talk to your doctor before resuming sexual activity.  You should not drive until cleared by your doctor.  Until released by your doctor, you should not return to work or school.  You should rest at home and let your body heal.   You may shower three days after your surgery.  After showering, lightly dab your incision dry. Do not take a tub bath or go swimming until approved by your doctor at your follow-up appointment.  If you  smoke, we strongly recommend that you quit.  Smoking has been proven to interfere with normal bone healing and will dramatically reduce the success rate of your surgery. Please contact [facility] (800-QUIT-NOW) and use the resources at www.[facility].com for assistance in stopping smoking.  Surgical Incision   Keep your incision area clean and dry.  Your incision was closed with Dermabond (skin glue). This is come off on its own in 10-14 days.  It is ok to shower normally just do not let water directly hit your incision. Pat dry when your get out of the shower. Do not submerge incision (no pools, baths, etc)  Diet           You may return to your usual diet. However, you may experience discomfort when swallowing in the first month after your surgery. This is normal. You may find that softer foods are more comfortable for you to swallow. Be sure to stay hydrated.  When to Contact us  You may experience pain in your neck and/or pain between your shoulder blades. This is normal and should improve in the next few weeks with the help of pain medication, muscle relaxers, and rest. Some patients report that a warm compress on the back of the neck or between the shoulder blades helps.  However, should you experience any of the following, contact us immediately: New numbness or weakness Pain that is progressively getting worse, and is not relieved by your pain medication, muscle relaxers, rest, and warm compresses Bleeding, redness, swelling, pain, or drainage from surgical incision Chills or flu-like symptoms Fever greater than  101.0 F (38.3 C) Inability to eat, drink fluids, or take medications Problems with bowel or bladder functions Difficulty breathing or shortness of breath Warmth, tenderness, or swelling in your calf Contact Information During office hours (Monday-Friday 9 am to 5 pm), please call your physician at [phone] and ask for Berdine Addison After hours and weekends, please  call [phone] and speak with the answering service, who will contact the doctor on call.  If that fails, call the [facility] Operator at [phone] and ask for the Neurosurgery Resident On Call  For a life-threatening emergency, call Andrew   The drugs that you were given will stay in your system until tomorrow so for the next 24 hours you should not:  Drive an automobile Make any legal decisions Drink any alcoholic beverage   You may resume regular meals tomorrow.  Today it is better to start with liquids and gradually work up to solid foods.  You may eat anything you prefer, but it is better to start with liquids, then soup and crackers, and gradually work up to solid foods.   Please notify your doctor immediately if you have any unusual bleeding, trouble breathing, redness and pain at the surgery site, drainage, fever, or pain not relieved by medication.    Additional Instructions:   Please contact your physician with any problems or Same Day Surgery at [phone], Monday through Friday 6 am to 4 pm, or [place] at [facility] number at [phone].

## 2021-07-09 NOTE — H&P (Signed)
I have reviewed and confirmed my history and physical from 06/18/2021 with no additions or changes. Plan for C4-5 ACDF.  Risks and benefits reviewed.  Heart sounds normal no MRG. Chest Clear to Auscultation Bilaterally.

## 2021-07-09 NOTE — Anesthesia Preprocedure Evaluation (Addendum)
Anesthesia Evaluation  Patient identified by MRN, date of birth, ID band Patient awake    Reviewed: Allergy & Precautions, NPO status , Patient's Chart, lab work & pertinent test results  History of Anesthesia Complications (+) PONV and history of anesthetic complications (no PONV with previous back surgery)  Airway Mallampati: III  TM Distance: >3 FB Neck ROM: Full    Dental no notable dental hx. (+) Teeth Intact   Pulmonary shortness of breath and with exertion, neg sleep apnea, COPD, Patient abstained from smoking.Not current smoker, former smoker,  Supposed to be tested for OSA, no formal diagnosis.  COPD in chart, patient says she is asymptomatic and not on inhalers   Pulmonary exam normal breath sounds clear to auscultation       Cardiovascular Exercise Tolerance: Poor METShypertension, (-) CAD and (-) Past MI (-) dysrhythmias  Rhythm:Regular Rate:Normal - Systolic murmurs Non-occlusive b/l carotid disease, followed by vascular   Neuro/Psych  Neuromuscular disease (Neck pain and sciatica) negative psych ROS   GI/Hepatic GERD  Controlled,(+)     (-) substance abuse  ,   Endo/Other  neg diabetesHypothyroidism   Renal/GU negative Renal ROS     Musculoskeletal  (+) Arthritis , Osteoarthritis,    Abdominal (+) - obese,   Peds  Hematology   Anesthesia Other Findings Past Medical History: No date: Arthritis No date: Carotid artery stenosis No date: Colon polyp No date: Degenerative joint disease No date: GERD (gastroesophageal reflux disease) No date: Hyperlipidemia No date: Hypertension No date: Hypothyroidism No date: Osteoporosis No date: Pulmonary emphysema (HCC) No date: Skin cancer     Comment:  lesion removed from nose No date: Spinal stenosis No date: Thyroid disease  Reproductive/Obstetrics                            Anesthesia Physical  Anesthesia Plan  ASA:  III  Anesthesia Plan: General   Post-op Pain Management:    Induction: Intravenous  PONV Risk Score and Plan: 4 or greater and Ondansetron, Dexamethasone, TIVA and Propofol infusion  Airway Management Planned: Oral ETT  Additional Equipment: Arterial line  Intra-op Plan:   Post-operative Plan: Extubation in OR  Informed Consent: I have reviewed the patients History and Physical, chart, labs and discussed the procedure including the risks, benefits and alternatives for the proposed anesthesia with the patient or authorized representative who has indicated his/her understanding and acceptance.     Dental advisory given  Plan Discussed with: CRNA and Anesthesiologist  Anesthesia Plan Comments: (Discussed risks of anesthesia with patient, including PONV, sore throat, lip/dental damage, possible blood transfusion. Rare risks discussed as well, such as cardiorespiratory and neurological sequelae. Patient understands.  )       Anesthesia Quick Evaluation

## 2021-07-09 NOTE — Anesthesia Procedure Notes (Signed)
Arterial Line Insertion Start/End8/04/2021 7:59 AM, 07/09/2021 8:02 AM Performed by: Iran Ouch, MD, anesthesiologist  Patient location: OR. Preanesthetic checklist: patient identified, IV checked, surgical consent, monitors and equipment checked and pre-op evaluation Right, radial was placed Catheter size: 20 G Hand hygiene performed  and maximum sterile barriers used   Attempts: 1 Procedure performed without using ultrasound guided technique. Following insertion, dressing applied and Biopatch. Patient tolerated the procedure well with no immediate complications.

## 2021-07-09 NOTE — Transfer of Care (Signed)
Immediate Anesthesia Transfer of Care Note  Patient: Carol Carey  Procedure(s) Performed: Procedure(s) with comments: C4-5 ANTERIOR CERVICAL DECOMPRESSION/DISCECTOMY FUSION 1 LEVEL (N/A) - schedule as first case  Patient Location: PACU  Anesthesia Type:General  Level of Consciousness: sedated  Airway & Oxygen Therapy: Patient Spontanous Breathing and Patient connected to face mask oxygen  Post-op Assessment: Report given to RN and Post -op Vital signs reviewed and stable  Post vital signs: Reviewed and stable  Last Vitals:  Vitals:   07/09/21 0621 07/09/21 0926  BP: (!) 152/78 (!) 173/83  Pulse: 72 78  Resp: 18 17  Temp: (!) 36.2 C   SpO2: [id number]    Complications: No apparent anesthesia complications

## 2021-07-09 NOTE — Anesthesia Procedure Notes (Signed)
Procedure Name: Intubation Date/Time: 07/09/2021 7:57 AM Performed by: Gayland Curry, CRNA Pre-anesthesia Checklist: Patient identified, Emergency Drugs available, Suction available and Patient being monitored Patient Re-evaluated:Patient Re-evaluated prior to induction Oxygen Delivery Method: Circle system utilized Preoxygenation: Pre-oxygenation with 100% oxygen Induction Type: IV induction Ventilation: Mask ventilation without difficulty and Oral airway inserted - appropriate to patient size Laryngoscope Size: McGraph and 3 Tube type: Oral Tube size: 6.5 mm Number of attempts: 1 Airway Equipment and Method: Stylet Placement Confirmation: ETT inserted through vocal cords under direct vision, positive ETCO2 and breath sounds checked- equal and bilateral Secured at: 19 cm Tube secured with: Tape Dental Injury: Teeth and Oropharynx as per pre-operative assessment

## 2021-07-12 ENCOUNTER — Encounter: Payer: Self-pay | Admitting: Neurosurgery

## 2021-07-12 ENCOUNTER — Other Ambulatory Visit: Payer: Self-pay | Admitting: Neurosurgery

## 2021-07-12 MED ORDER — OXYCODONE HCL 5 MG PO TABS: 5.0000 mg | ORAL_TABLET | Freq: Three times a day (TID) | ORAL | 0 refills | Status: DC | PRN

## 2021-07-12 MED ORDER — PREGABALIN 75 MG PO CAPS: 75.0000 mg | ORAL_CAPSULE | Freq: Two times a day (BID) | ORAL | 2 refills | Status: DC

## 2021-07-12 NOTE — Anesthesia Postprocedure Evaluation (Signed)
Anesthesia Post Note  Patient: Carol Carey  Procedure(s) Performed: C4-5 ANTERIOR CERVICAL DECOMPRESSION/DISCECTOMY FUSION 1 LEVEL  Patient location during evaluation: PACU Anesthesia Type: General Level of consciousness: awake and alert Pain management: pain level controlled Vital Signs Assessment: post-procedure vital signs reviewed and stable Respiratory status: spontaneous breathing, nonlabored ventilation and respiratory function stable Cardiovascular status: blood pressure returned to baseline and stable Postop Assessment: no apparent nausea or vomiting Anesthetic complications: no   No notable events documented.   Last Vitals:  Vitals:   07/09/21 1140 07/09/21 1300  BP: (!) 184/88 (!) 185/92  Pulse: 69 76  Resp: 16 16  Temp: (!) 36 C   SpO2: 95% 100%    Last Pain:  Vitals:   07/09/21 1300  TempSrc:   PainSc: 0-No pain                 [place] Ouch

## 2021-07-12 NOTE — Addendum Note (Signed)
Addended by: Deetta Perla on: 07/12/2021 09:16 AM   Modules accepted: Orders

## 2021-08-13 DIAGNOSIS — D039 Melanoma in situ, unspecified: Secondary | ICD-10-CM

## 2021-08-13 HISTORY — DX: Melanoma in situ, unspecified: D03.9

## 2021-12-23 ENCOUNTER — Other Ambulatory Visit: Payer: Self-pay | Admitting: Internal Medicine

## 2021-12-23 DIAGNOSIS — Z1231 Encounter for screening mammogram for malignant neoplasm of breast: Secondary | ICD-10-CM

## 2022-02-15 ENCOUNTER — Other Ambulatory Visit: Payer: Self-pay

## 2022-02-15 ENCOUNTER — Encounter (INDEPENDENT_AMBULATORY_CARE_PROVIDER_SITE_OTHER): Payer: Self-pay | Admitting: Vascular Surgery

## 2022-02-15 ENCOUNTER — Ambulatory Visit (INDEPENDENT_AMBULATORY_CARE_PROVIDER_SITE_OTHER): Payer: Medicare Other | Admitting: Vascular Surgery

## 2022-02-15 ENCOUNTER — Ambulatory Visit (INDEPENDENT_AMBULATORY_CARE_PROVIDER_SITE_OTHER): Payer: Medicare Other

## 2022-02-15 VITALS — BP 152/84 | HR 71 | Resp 16 | Wt 171.8 lb

## 2022-02-15 DIAGNOSIS — I10 Essential (primary) hypertension: Secondary | ICD-10-CM | POA: Diagnosis not present

## 2022-02-15 DIAGNOSIS — E785 Hyperlipidemia, unspecified: Secondary | ICD-10-CM

## 2022-02-15 DIAGNOSIS — I6523 Occlusion and stenosis of bilateral carotid arteries: Secondary | ICD-10-CM

## 2022-02-15 NOTE — Assessment & Plan Note (Signed)
Carotid duplex today reveals stable 1 to 39% right ICA stenosis and mild progression on the left just into the 40 to 59% range. ?No role for intervention at this level.  Continue current medical regimen.  Recheck in 1 year ?

## 2022-02-15 NOTE — Progress Notes (Signed)
? ? ?MRN : [id number] ? ?Carol Carey is a 71 y.o. (07-Oct-1951) female who presents with chief complaint of  ?Chief Complaint  ?Patient presents with  ? Follow-up  ?  Ultrasound follow up  ?. ? ?History of Present Illness: Patient returns today in follow up of her carotid disease.  We also helped with her lumbar interbody fusion with an anterior approach which is healed well and her backs doing much better.  No focal neurologic symptoms from her carotid disease. Specifically, the patient denies amaurosis fugax, speech or swallowing difficulties, or arm or leg weakness or numbness ?Carotid duplex today reveals stable 1 to 39% right ICA stenosis and mild progression on the left just into the 40 to 59% range. ? ?Current Outpatient Medications  ?Medication Sig Dispense Refill  ? acetaminophen (TYLENOL) 500 MG tablet Take 2 tablets (1,000 mg total) by mouth every 6 (six) hours. (Patient taking differently: Take 1,000 mg by mouth every 6 (six) hours as needed for moderate pain.) 30 tablet 0  ? alendronate (FOSAMAX) 70 MG tablet Take 70 mg by mouth every Friday. Take with a full glass of water on an empty stomach.    ? atenolol (TENORMIN) 50 MG tablet Take 50 mg by mouth at bedtime.     ? atorvastatin (LIPITOR) 20 MG tablet Take 20 mg by mouth daily.     ? buPROPion (WELLBUTRIN XL) 150 MG 24 hr tablet Take 150 mg by mouth daily.    ? CALCIUM-VITAMIN D PO Take 1 tablet by mouth daily.    ? cyclobenzaprine (FLEXERIL) 10 MG tablet Take 10 mg by mouth 3 (three) times daily as needed for muscle spasms.    ? docusate sodium (COLACE) 100 MG capsule Take 1 capsule (100 mg total) by mouth 2 (two) times daily. (Patient taking differently: Take 100 mg by mouth daily as needed for moderate constipation.) 10 capsule 0  ? esomeprazole (NEXIUM) 20 MG capsule Take 20 mg by mouth at bedtime.    ? fluticasone (FLONASE) 50 MCG/ACT nasal spray Place 1 spray into the nose daily as needed for allergies.    ? hydrochlorothiazide (HYDRODIURIL)  25 MG tablet Take 25 mg by mouth daily.    ? hydrocortisone 2.5 % cream Apply 1 application topically 2 (two) times daily as needed (hemorrhoids).    ? isosorbide mononitrate (IMDUR) 30 MG 24 hr tablet Take 30 mg by mouth daily.    ? levothyroxine (SYNTHROID) 100 MCG tablet Take 100 mcg by mouth at bedtime.    ? losartan (COZAAR) 100 MG tablet Take 100 mg by mouth daily.     ? Potassium 99 MG TABS Take 99 mg by mouth daily.    ? pregabalin (LYRICA) 75 MG capsule Take 75 mg by mouth daily.    ? pregabalin (LYRICA) 75 MG capsule Take 1 capsule (75 mg total) by mouth 2 (two) times daily. 90 capsule 2  ? montelukast (SINGULAIR) 10 MG tablet Take 10 mg by mouth at bedtime.  (Patient not taking: Reported on 07/01/2021)    ? senna (SENOKOT) 8.6 MG TABS tablet Take 1 tablet (8.6 mg total) by mouth at bedtime as needed for mild constipation. (Patient not taking: Reported on 02/15/2022) 30 tablet 0  ? ?No current facility-administered medications for this visit.  ? ? ?Past Medical History:  ?Diagnosis Date  ? Aortic atherosclerosis ([place])   ? Arthritis   ? Ascending aorta dilatation ([facility])   ? a.) measured 3.9cm by TTE on 06/29/21  ?  CAD (coronary artery disease)   ? Carotid artery stenosis   ? Colon polyp   ? COPD (chronic obstructive pulmonary disease) ([place])   ? Degenerative joint disease   ? GERD (gastroesophageal reflux disease)   ? Hepatic steatosis   ? Hyperlipidemia   ? Hypertension   ? Hypothyroidism   ? Osteoporosis   ? Pulmonary emphysema ([place])   ? Seasonal allergies   ? Skin cancer   ? lesion removed from nose  ? Sleep apnea   ? not on nocturnal PAP therapy  ? Spinal stenosis   ? Valvular insufficiency   ? a.) TTE on 06/29/2021 --> moderate AR, trivial MR/TR, mild PR  ? ? ?Past Surgical History:  ?Procedure Laterality Date  ? ABDOMINAL EXPOSURE N/A 09/30/2020  ? Procedure: ABDOMINAL EXPOSURE;  Surgeon: Algernon Huxley, MD;  Location: [facility] ORS;  Service: Vascular;  Laterality: N/A;  ? ABDOMINAL HYSTERECTOMY  1993  ?  ANTERIOR AND POSTERIOR SPINAL FUSION N/A 09/30/2020  ? Procedure: L5-S1 ANTERIOR LUMBAR INTERBODY FUSION, L3-S1 INSTRUMENTATION;  Surgeon: Meade Maw, MD;  Location: [facility] OR[facility];  Service: Neurosurgery;  Laterality: N/A;  ? ANTERIOR CERVICAL DECOMP/DISCECTOMY FUSION N/A 07/09/2021  ? Procedure: C4-5 ANTERIOR CERVICAL DECOMPRESSION/DISCECTOMY FUSION 1 LEVEL;  Surgeon: Meade Maw, MD;  Location: [facility] ORS;  Service: Neurosurgery;  Laterality: N/A;  schedule as first case  ? APPENDECTOMY  1974  ? BACK SURGERY    ? BREAST BIOPSY Right 02/15/2021  ? Q clip, [place] bx, 9:30 5cmfn path pending  ? BREAST BIOPSY Left 02/15/2021  ? x clip, [place] bx, 1:00 2cmfn, path pending  ? BREAST EXCISIONAL BIOPSY Right 1985  ? Benign cycts removed  ? [place]  ? COLONOSCOPY    ? LUMBAR FUSION  2010  ? Dr. March Rummage  ? LUMBAR FUSION  2013  ? x 2 Dr. Ellender Hose  ? TONSILLECTOMY    ? TUBAL LIGATION  1974  ? UPPER GI ENDOSCOPY    ? ? ? ?Social History  ? ?Tobacco Use  ? Smoking status: Former  ?  Packs/day: 1.00  ?  Years: 48.00  ?  Pack years: 48.00  ?  Types: Cigarettes  ?  Quit date: 02/14/2020  ?  Years since quitting: 2.0  ? Smokeless tobacco: Never  ?Vaping Use  ? Vaping Use: Never used  ?Substance Use Topics  ? Alcohol use: Yes  ?  Alcohol/week: 14.0 standard drinks  ?  Types: 14 Glasses of wine per week  ? Drug use: Not Currently  ?  Types: Marijuana  ?  Comment: 2018 tried using  ? ? ? ? ?Family History  ?Problem Relation Age of Onset  ? Hypertension Mother   ? Heart attack Father   ? Lung cancer Brother   ? Breast cancer Neg Hx   ? ? ? ?Allergies  ?Allergen Reactions  ? Demerol [Meperidine Hcl] Nausea And Vomiting  ?  Vomiting lasted for 2 days after taking med  ? Latex Rash  ? Lisinopril Cough  ? Penicillins Rash  ? Sulfa Antibiotics Rash  ? ? ? ?REVIEW OF SYSTEMS (Negative unless checked) ? ?Constitutional: '[]'$ Weight loss  '[]'$ Fever  '[]'$ Chills ?Cardiac: '[]'$ Chest pain   '[]'$ Chest pressure   '[]'$ Palpitations   '[]'$ Shortness of breath  when laying flat   '[]'$ Shortness of breath at rest   '[]'$ Shortness of breath with exertion. ?Vascular:  '[]'$ Pain in legs with walking   '[]'$ Pain in legs at rest   '[]'$ Pain in legs when laying flat   '[]'$   Claudication   '[]'$ Pain in feet when walking  '[]'$ Pain in feet at rest  '[]'$ Pain in feet when laying flat   '[]'$ History of DVT   '[]'$ Phlebitis   '[]'$ Swelling in legs   '[]'$ Varicose veins   '[]'$ Non-healing ulcers ?Pulmonary:   '[]'$ Uses home oxygen   '[]'$ Productive cough   '[]'$ Hemoptysis   '[]'$ Wheeze  '[]'$ COPD   '[]'$ Asthma ?Neurologic:  '[]'$ Dizziness  '[]'$ Blackouts   '[]'$ Seizures   '[]'$ History of stroke   '[]'$ History of TIA  '[]'$ Aphasia   '[]'$ Temporary blindness   '[]'$ Dysphagia   '[]'$ Weakness or numbness in arms   '[x]'$ Weakness or numbness in legs ?Musculoskeletal:  '[x]'$ Arthritis   '[]'$ Joint swelling   '[]'$ Joint pain   '[x]'$ Low back pain ?Hematologic:  '[]'$ Easy bruising  '[]'$ Easy bleeding   '[]'$ Hypercoagulable state   '[x]'$ Anemic   ?Gastrointestinal:  '[]'$ Blood in stool   '[]'$ Vomiting blood  '[x]'$ Gastroesophageal reflux/heartburn   '[]'$ Abdominal pain ?Genitourinary:  '[]'$ Chronic kidney disease   '[]'$ Difficult urination  '[]'$ Frequent urination  '[]'$ Burning with urination   '[]'$ Hematuria ?Skin:  '[]'$ Rashes   '[]'$ Ulcers   '[]'$ Wounds ?Psychological:  '[]'$ History of anxiety   '[]'$  History of major depression. ? ?Physical Examination ? ?BP (!) 152/84 (BP Location: Left Arm)   Pulse 71   Resp 16   Wt 171 lb 12.8 oz (77.9 kg)   BMI 34.70 kg/m?  ?Gen:  WD/WN, NAD ?Head: [place]/AT, No temporalis wasting. ?Ear/Nose/Throat: Hearing grossly intact, nares w/o erythema or drainage ?Eyes: Conjunctiva clear. Sclera non-icteric ?Neck: Supple.  Trachea midline. No bruit ?Pulmonary:  Good air movement, no use of accessory muscles.  ?Cardiac: RRR, no JVD ?Vascular:  ?Vessel Right Left  ?Radial Palpable Palpable  ?    ?    ?    ?    ?    ? ?Musculoskeletal: M/S 5/5 throughout.  No deformity or atrophy. No edema. ?Neurologic: Sensation grossly intact in extremities.  Symmetrical.  Speech is fluent.  ?Psychiatric: Judgment intact, Mood &  affect appropriate for pt's clinical situation. ?Dermatologic: No rashes or ulcers noted.  No cellulitis or open wounds. ? ? ? ? ? ?Labs ?No results found for this or any previous visit (from the past 2160 hour(s))

## 2022-03-16 ENCOUNTER — Ambulatory Visit
Admission: RE | Admit: 2022-03-16 | Discharge: 2022-03-16 | Disposition: A | Discharge status: Home / Self Care | Payer: Medicare Other | Source: Ambulatory Visit | Attending: Internal Medicine | Admitting: Internal Medicine

## 2022-03-16 DIAGNOSIS — Z1231 Encounter for screening mammogram for malignant neoplasm of breast: Secondary | ICD-10-CM | POA: Insufficient documentation

## 2022-05-04 ENCOUNTER — Telehealth: Payer: Self-pay | Admitting: Acute Care

## 2022-05-04 NOTE — Telephone Encounter (Signed)
Attempted to reach pt for annual LDCT scan-[facility]

## 2022-05-19 ENCOUNTER — Other Ambulatory Visit: Payer: Self-pay

## 2022-05-19 DIAGNOSIS — Z87891 Personal history of nicotine dependence: Secondary | ICD-10-CM

## 2022-05-19 DIAGNOSIS — Z122 Encounter for screening for malignant neoplasm of respiratory organs: Secondary | ICD-10-CM

## 2022-05-23 IMAGING — MG DIGITAL DIAGNOSTIC BILAT W/ TOMO W/ CAD
8 series · 8 of 24 positions shown · non-contrast
Comparison: Previous exams including recent screening mammogram
dated 01/25/2021.

CLINICAL DATA: Patient returns today to evaluate bilateral breast
masses identified on recent screening mammogram. History of breast
cysts.

EXAM:
DIGITAL DIAGNOSTIC BILATERAL MAMMOGRAM WITH TOMOSYNTHESIS AND CAD;
ULTRASOUND LEFT BREAST LIMITED; ULTRASOUND RIGHT BREAST LIMITED
TECHNIQUE: Bilateral digital diagnostic mammography and breast tomosynthesis
was performed. The images were evaluated with computer-aided
detection.; Targeted ultrasound examination of the left breast was
performed; Targeted ultrasound examination of the right breast was
performed

[L CC synth-2D]
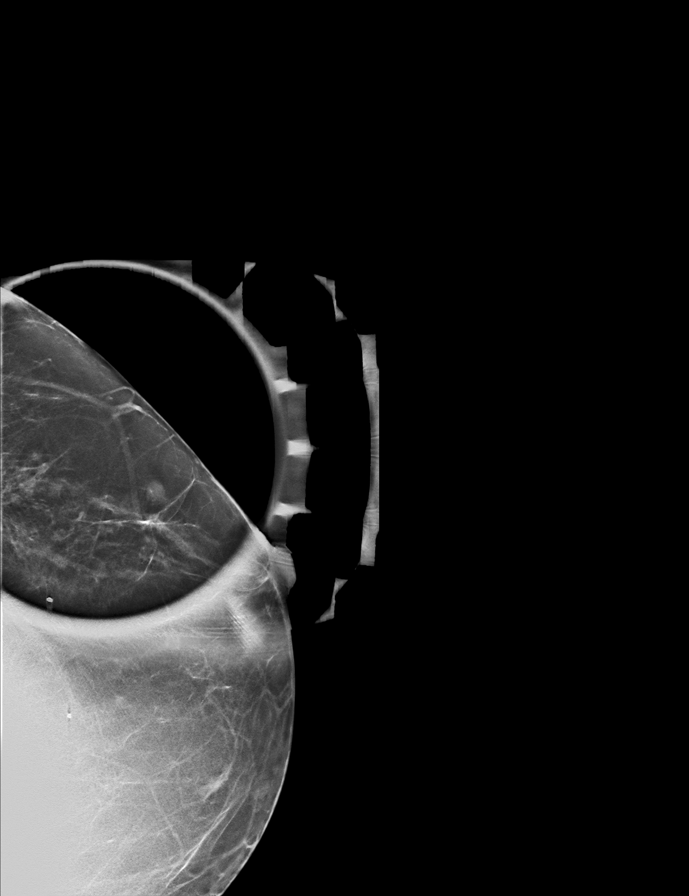

[R CC synth-2D]
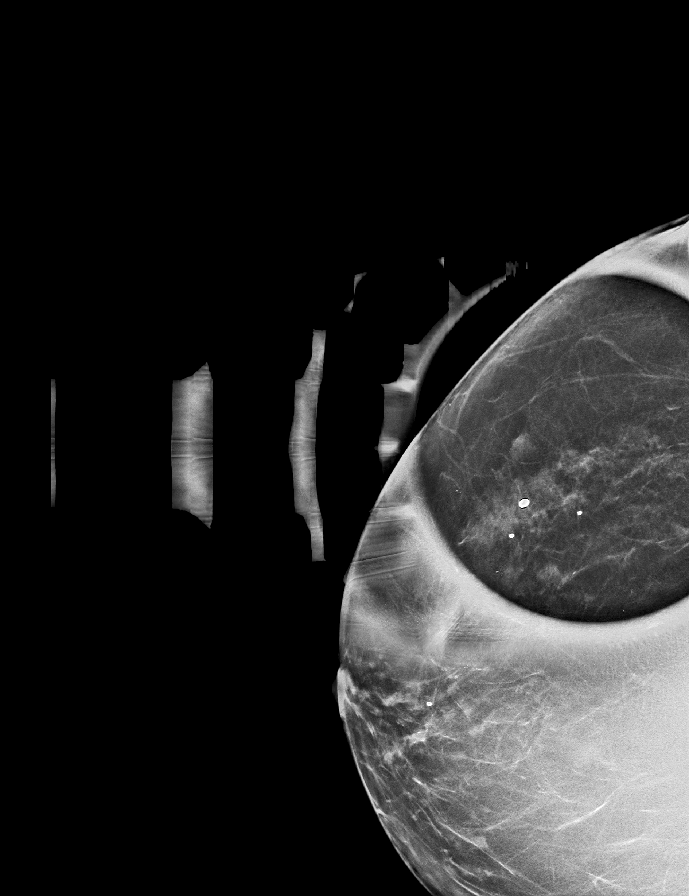

[R MLO synth-2D]
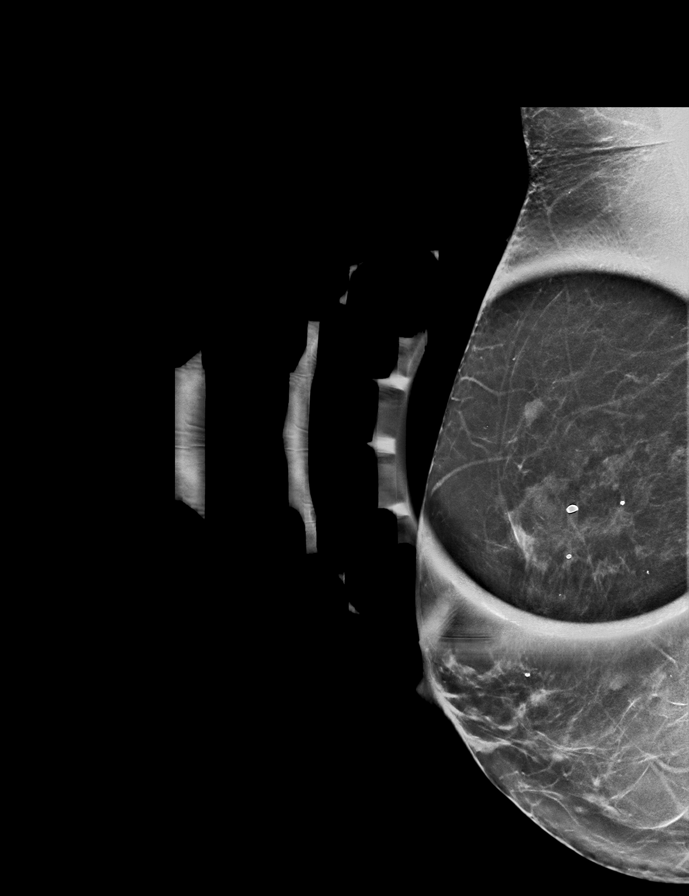

[L MLO synth-2D]
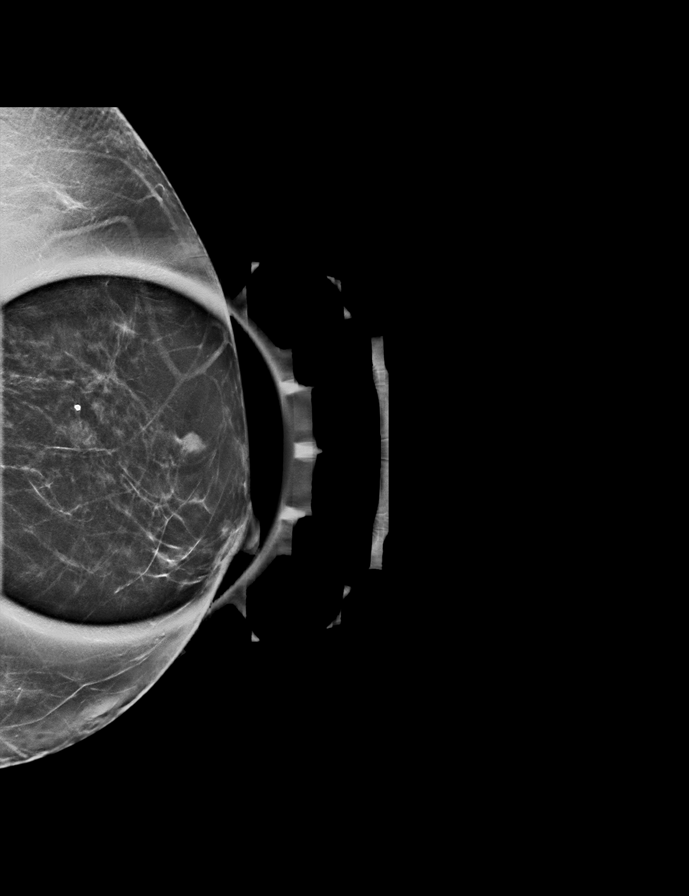

[R MLO tomo · tomo slice 29/57.0]
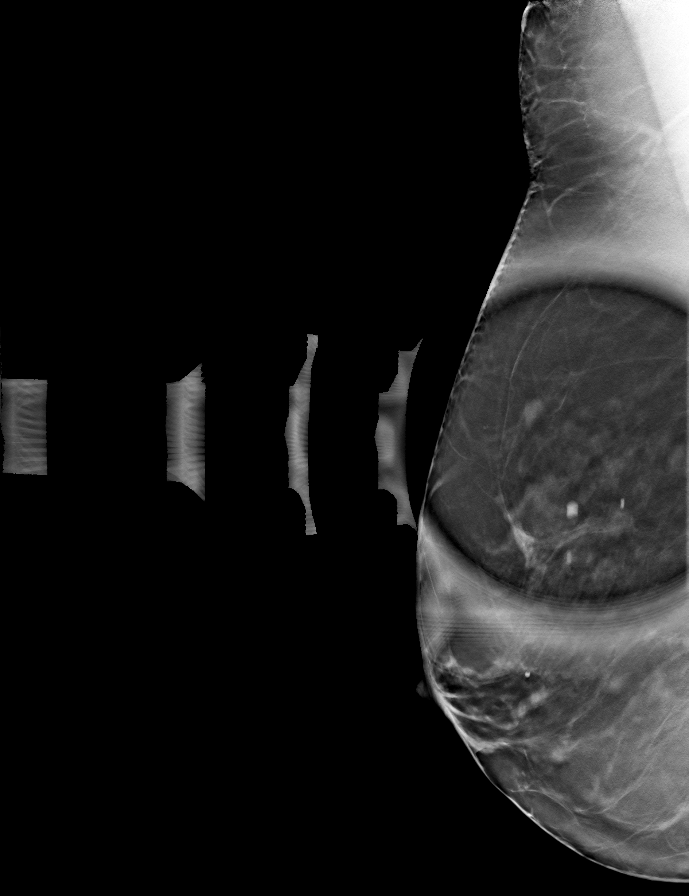

[L MLO tomo · tomo slice 28/55.0]
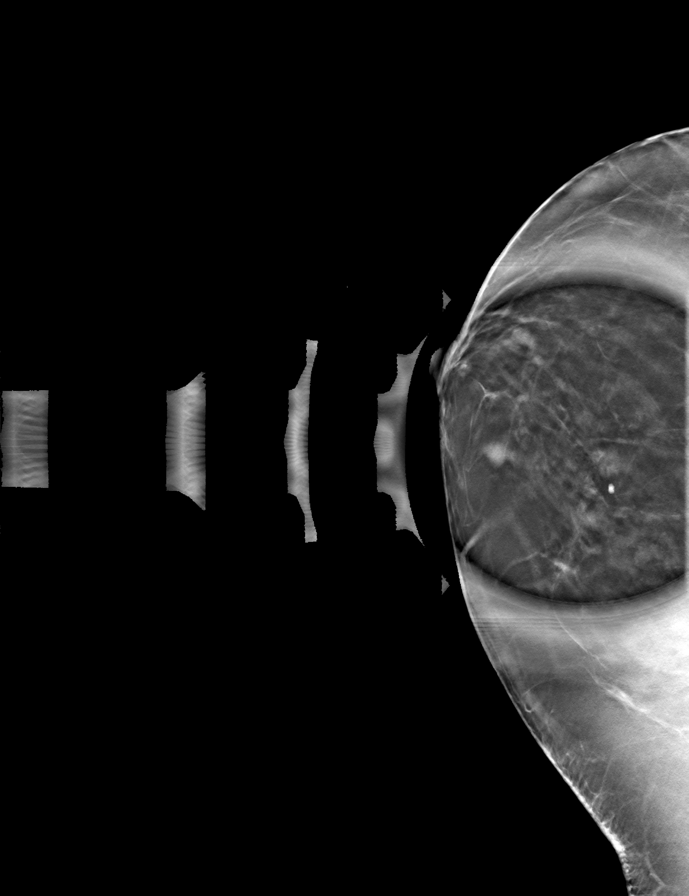

[L CC tomo · tomo slice 22/43.0]
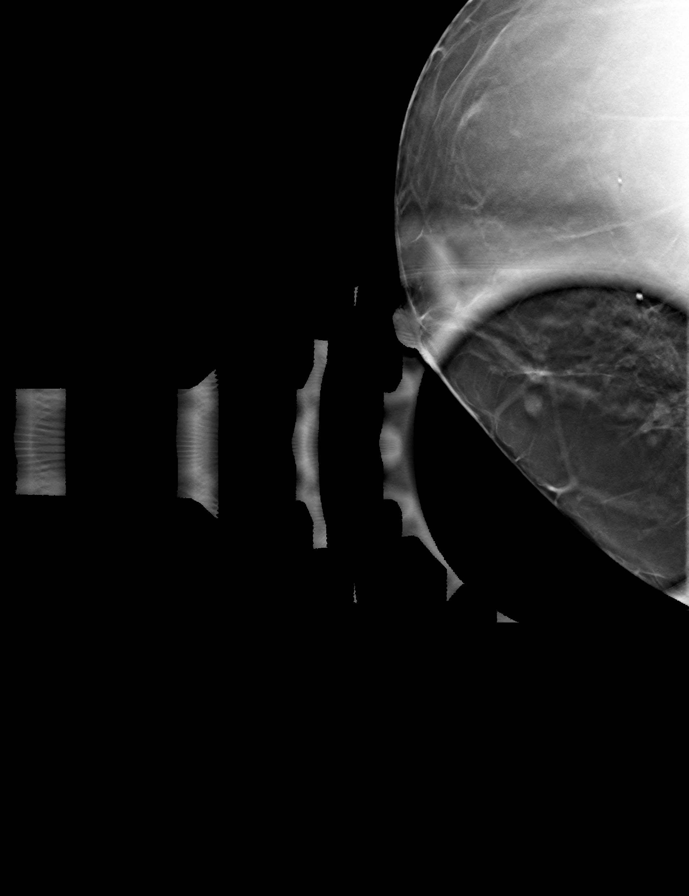

[R CC tomo · tomo slice 27/53.0]
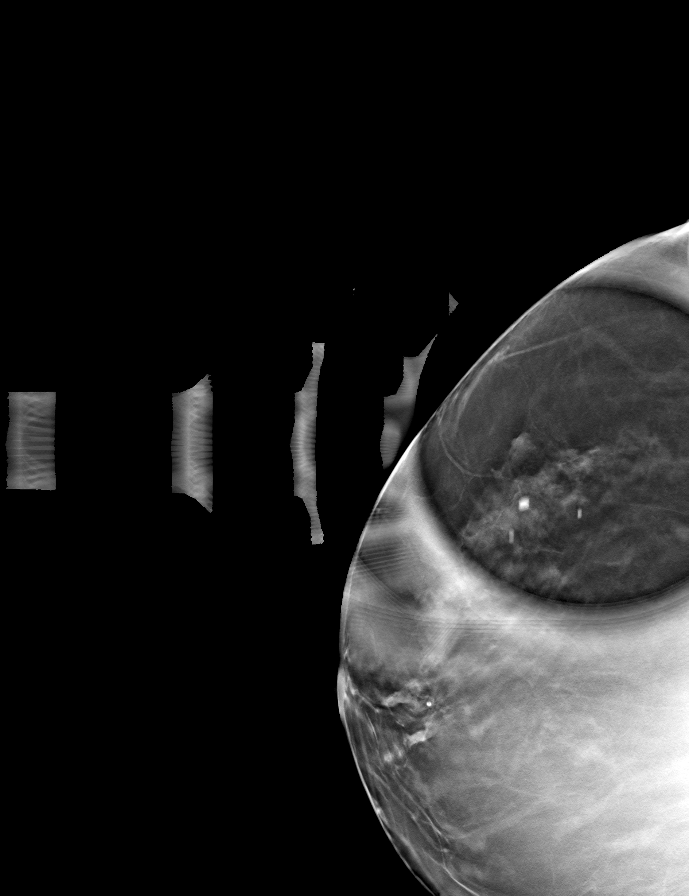

[8 of 24 positions shown; findings below may reference images not displayed]

ACR Breast Density Category b: There are scattered areas of
fibroglandular density.
FINDINGS: Bilateral diagnostic mammogram:

RIGHT breast: On today's additional diagnostic views with spot
compression and 3D tomosynthesis, a partially obscured low-density
mass is confirmed within the upper-outer quadrant, measuring
approximately 5 mm greatest dimension.

LEFT breast: On today's additional diagnostic views with spot
compression and 3D tomosynthesis, a partially obscured dense mass is
confirmed within the upper-outer quadrant, at anterior depth,
measuring approximately 4 mm greatest dimension.

RIGHT breast: Targeted ultrasound is performed, showing an oval
circumscribed hypoechoic mass in the RIGHT breast at the 9:30
o'clock axis, measuring 5 x 4 x 5 mm, without internal vascularity,
corresponding to the mammographic finding.

LEFT breast: Targeted ultrasound is performed, showing a round
hypoechoic mass in the LEFT breast at the 1 o'clock axis, 2 cm from
the nipple, measuring 5 mm, with partially irregular and indistinct
margins, questionably taller than wide, without internal
vascularity, corresponding to the mammographic finding.

Bilateral axillae were evaluated with ultrasound showing no enlarged
or morphologically abnormal lymph nodes.
IMPRESSION: 1. Indeterminate hypoechoic mass in the RIGHT breast at the 9:30
o'clock axis, measuring 5 mm, corresponding to the new mass seen on
mammogram. This may represent a complicated cyst with internal
debris. Ultrasound-guided biopsy is recommended to ensure benignity.
2. Suspicious hypoechoic mass in the LEFT breast at the 1 o'clock
axis, 2 cm from the nipple, with partially irregular and indistinct
margins, questionably taller than wide, corresponding to the dense
mass seen on mammogram. This may also represent a complicated cyst
with internal debris. Ultrasound-guided biopsy is recommended to
exclude malignancy.

RECOMMENDATION:
1. Ultrasound-guided biopsy of the RIGHT breast mass at the 9:30
o'clock axis, measuring 5 mm, corresponding to the new mass seen on
mammogram.
2. Ultrasound-guided biopsy of the LEFT breast mass at the 1 o'clock
axis, 2 cm from the nipple, measuring 5 mm, corresponding to the
dense mass seen on mammogram.

Ordering physician will be contacted and ultrasound-guided biopsies
will be scheduled at patient's convenience.

I have discussed the findings and recommendations with the patient.
If applicable, a reminder letter will be sent to the patient
regarding the next appointment.

BI-RADS CATEGORY  4: Suspicious.

## 2022-05-23 IMAGING — US US BREAST*R* LIMITED INC AXILLA
2 series · 8 of 8 positions shown · non-contrast
Comparison: Previous exams including recent screening mammogram
dated 01/25/2021.

CLINICAL DATA: Patient returns today to evaluate bilateral breast
masses identified on recent screening mammogram. History of breast
cysts.

EXAM:
DIGITAL DIAGNOSTIC BILATERAL MAMMOGRAM WITH TOMOSYNTHESIS AND CAD;
ULTRASOUND LEFT BREAST LIMITED; ULTRASOUND RIGHT BREAST LIMITED
TECHNIQUE: Bilateral digital diagnostic mammography and breast tomosynthesis
was performed. The images were evaluated with computer-aided
detection.; Targeted ultrasound examination of the left breast was
performed; Targeted ultrasound examination of the right breast was
performed

[Series 1: us breast*right* limited inc axilla · 0.06mm/px · 5 of 5 slices shown (1 of 2)]
[im 1/5]
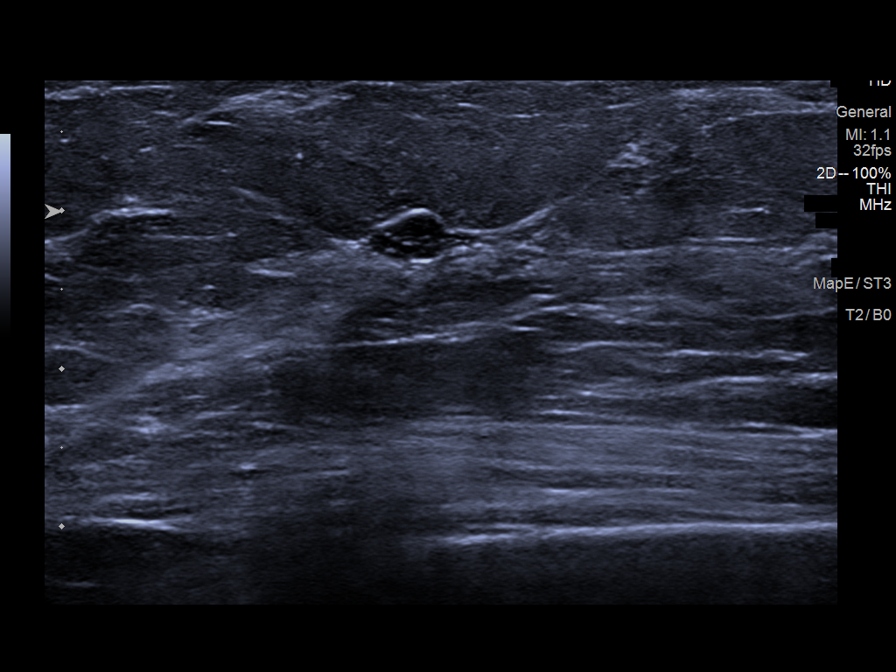
[im 2/5]
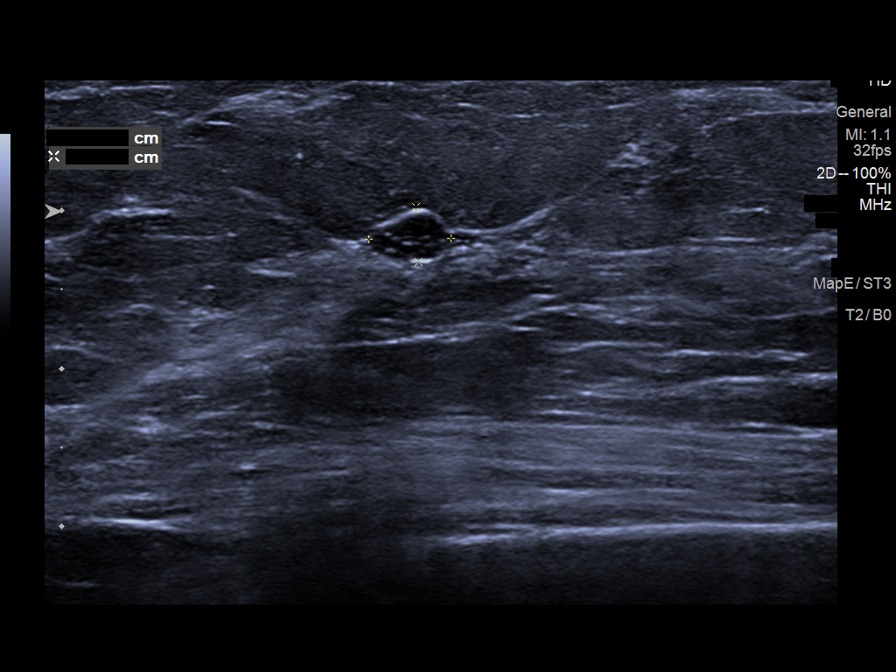
[im 3/5]
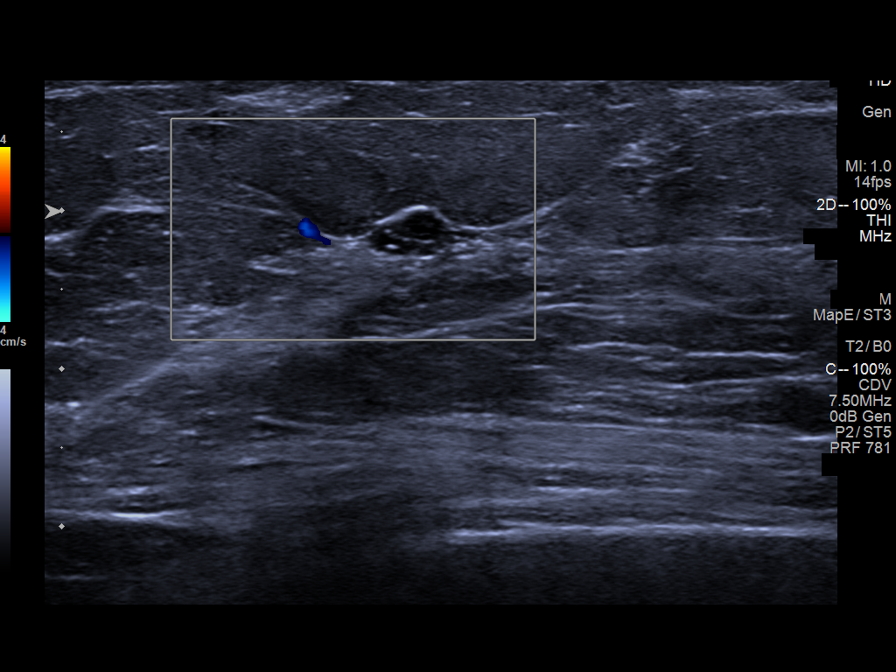
[im 4/5]
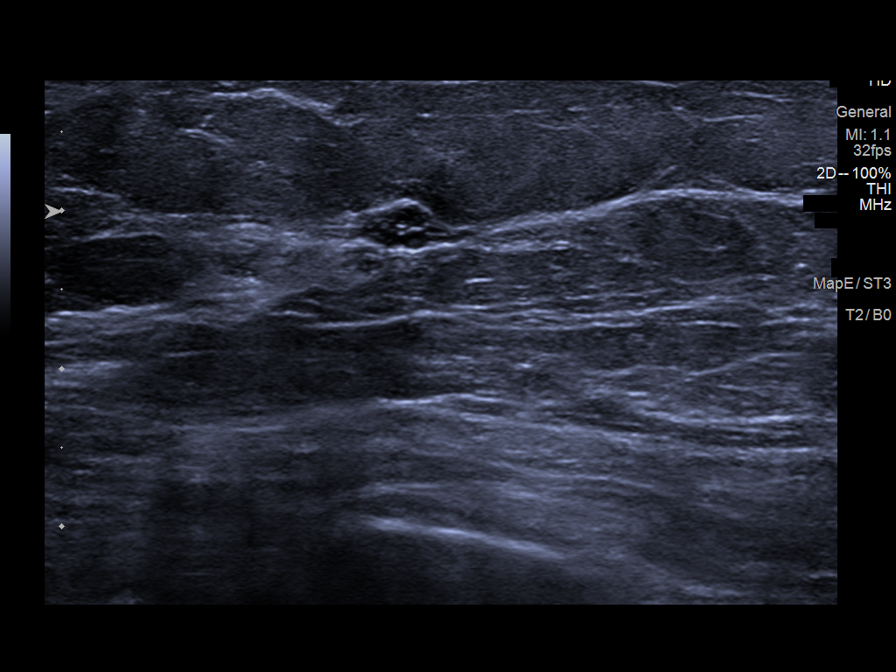
[im 5/5]
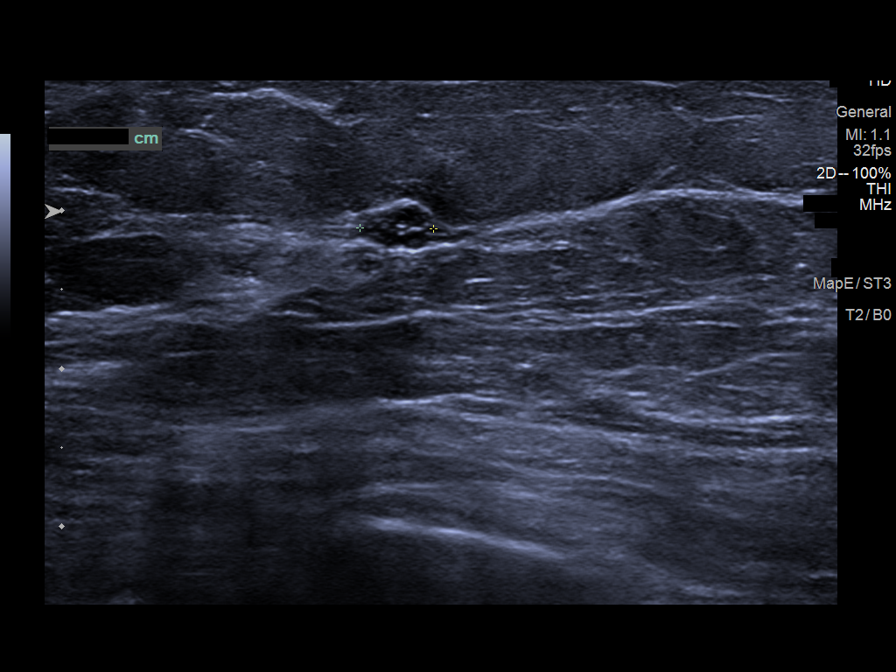

[Series 2: us breast*right* limited inc axilla · 0.07mm/px · 3 of 3 slices shown (2 of 2)]
[im 1/3]
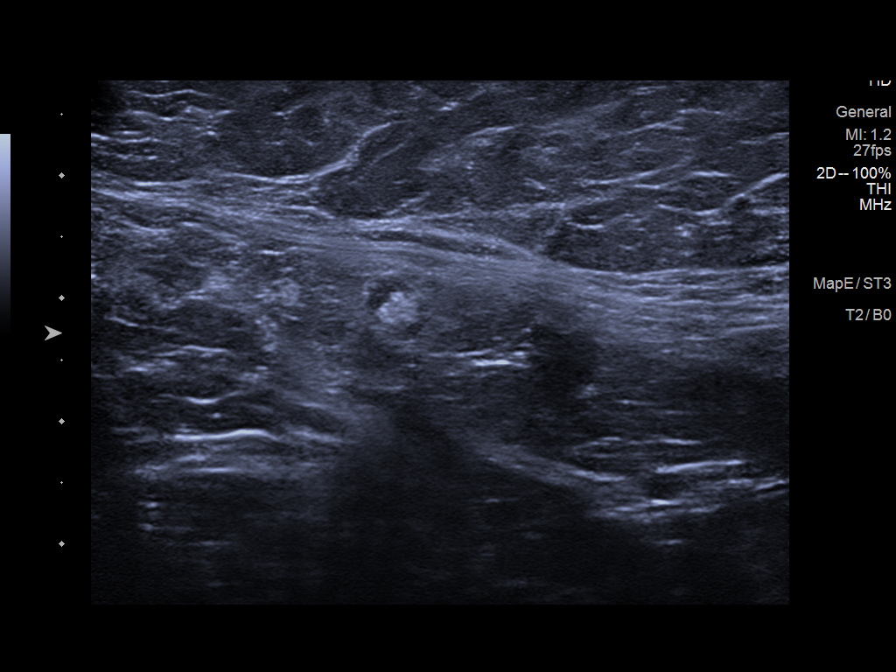
[im 2/3]
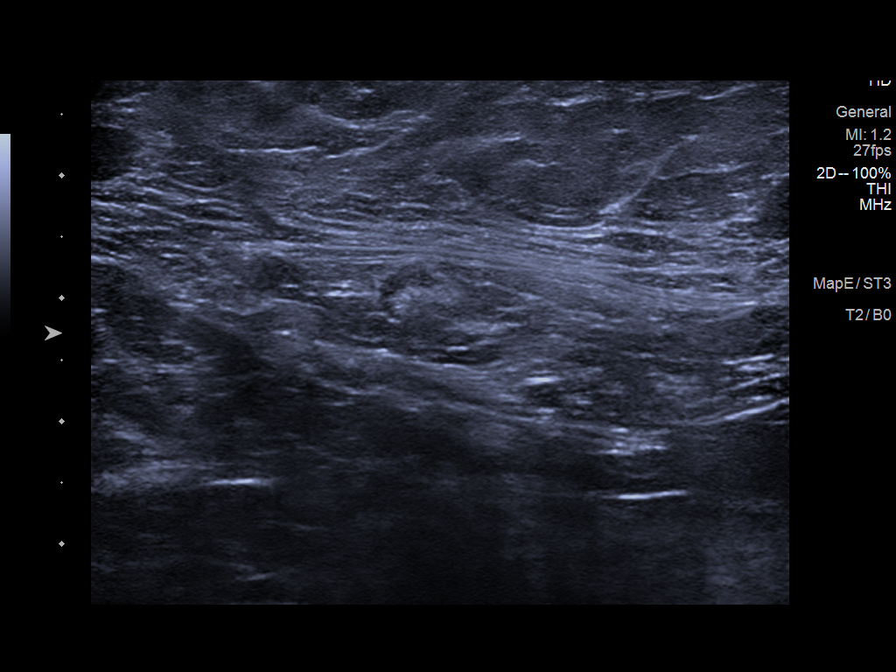
[im 3/3]
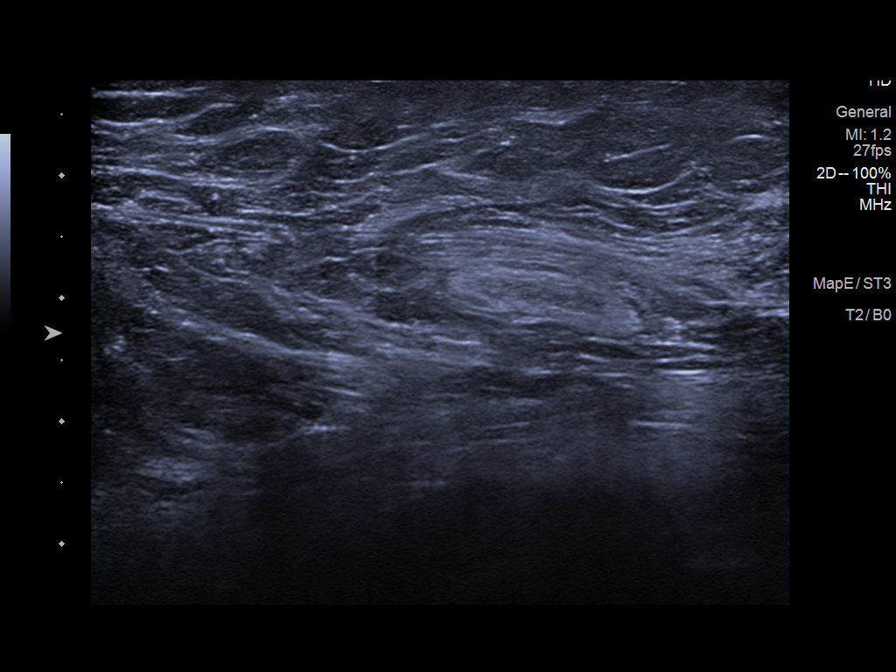

[8 of 8 positions shown; findings below may reference images not displayed]

ACR Breast Density Category b: There are scattered areas of
fibroglandular density.
FINDINGS: Bilateral diagnostic mammogram:

RIGHT breast: On today's additional diagnostic views with spot
compression and 3D tomosynthesis, a partially obscured low-density
mass is confirmed within the upper-outer quadrant, measuring
approximately 5 mm greatest dimension.

LEFT breast: On today's additional diagnostic views with spot
compression and 3D tomosynthesis, a partially obscured dense mass is
confirmed within the upper-outer quadrant, at anterior depth,
measuring approximately 4 mm greatest dimension.

RIGHT breast: Targeted ultrasound is performed, showing an oval
circumscribed hypoechoic mass in the RIGHT breast at the 9:30
o'clock axis, measuring 5 x 4 x 5 mm, without internal vascularity,
corresponding to the mammographic finding.

LEFT breast: Targeted ultrasound is performed, showing a round
hypoechoic mass in the LEFT breast at the 1 o'clock axis, 2 cm from
the nipple, measuring 5 mm, with partially irregular and indistinct
margins, questionably taller than wide, without internal
vascularity, corresponding to the mammographic finding.

Bilateral axillae were evaluated with ultrasound showing no enlarged
or morphologically abnormal lymph nodes.
IMPRESSION: 1. Indeterminate hypoechoic mass in the RIGHT breast at the 9:30
o'clock axis, measuring 5 mm, corresponding to the new mass seen on
mammogram. This may represent a complicated cyst with internal
debris. Ultrasound-guided biopsy is recommended to ensure benignity.
2. Suspicious hypoechoic mass in the LEFT breast at the 1 o'clock
axis, 2 cm from the nipple, with partially irregular and indistinct
margins, questionably taller than wide, corresponding to the dense
mass seen on mammogram. This may also represent a complicated cyst
with internal debris. Ultrasound-guided biopsy is recommended to
exclude malignancy.

RECOMMENDATION:
1. Ultrasound-guided biopsy of the RIGHT breast mass at the 9:30
o'clock axis, measuring 5 mm, corresponding to the new mass seen on
mammogram.
2. Ultrasound-guided biopsy of the LEFT breast mass at the 1 o'clock
axis, 2 cm from the nipple, measuring 5 mm, corresponding to the
dense mass seen on mammogram.

Ordering physician will be contacted and ultrasound-guided biopsies
will be scheduled at patient's convenience.

I have discussed the findings and recommendations with the patient.
If applicable, a reminder letter will be sent to the patient
regarding the next appointment.

BI-RADS CATEGORY  4: Suspicious.

## 2022-05-25 ENCOUNTER — Ambulatory Visit: Payer: Medicare Other | Attending: Neurosurgery

## 2022-05-25 DIAGNOSIS — M542 Cervicalgia: Secondary | ICD-10-CM | POA: Diagnosis present

## 2022-05-25 NOTE — Therapy (Signed)
OUTPATIENT PHYSICAL THERAPY BALANCE EVALUATION   Patient Name: Carol Carey MRN: [id number] DOB:1951-10-21, 71 y.o., female Today's Date: 05/26/2022   PT End of Session - 05/25/22 1312     Visit Number 1    Number of Visits 17    Date for PT Re-Evaluation 07/20/22    Authorization Type eval: 05/25/22    PT Start Time 1315    PT Stop Time 1400    PT Time Calculation (min) 45 min    Activity Tolerance Patient tolerated treatment well    Behavior During Therapy [facility] for tasks assessed/performed             Past Medical History:  Diagnosis Date   Aortic atherosclerosis ([place])    Arthritis    Ascending aorta dilatation ([place])    a.) measured 3.9cm by TTE on 06/29/21   CAD (coronary artery disease)    Carotid artery stenosis    Colon polyp    COPD (chronic obstructive pulmonary disease) ([place])    Degenerative joint disease    GERD (gastroesophageal reflux disease)    Hepatic steatosis    Hyperlipidemia    Hypertension    Hypothyroidism    Osteoporosis    Pulmonary emphysema (H[facility])    Seasonal allergies    Skin cancer    lesion removed from nose   Sleep apnea    not on nocturnal PAP therapy   Spinal stenosis    Valvular insufficiency    a.) TTE on 06/29/2021 --> moderate AR, trivial MR/TR, mild PR   Past Surgical History:  Procedure Laterality Date   ABDOMINAL EXPOSURE N/A 09/30/2020   Procedure: ABDOMINAL EXPOSURE;  Surgeon: Algernon Huxley, MD;  Location: [facility] ORS;  Service: Vascular;  Laterality: N/A;   ABDOMINAL HYSTERECTOMY  1993   ANTERIOR AND POSTERIOR SPINAL FUSION N/A 09/30/2020   Procedure: L5-S1 ANTERIOR LUMBAR INTERBODY FUSION, L3-S1 INSTRUMENTATION;  Surgeon: Meade Maw, MD;  Location: [facility] OR[facility];  Service: Neurosurgery;  Laterality: N/A;   ANTERIOR CERVICAL DECOMP/DISCECTOMY FUSION N/A 07/09/2021   Procedure: C4-5 ANTERIOR CERVICAL DECOMPRESSION/DISCECTOMY FUSION 1 LEVEL;  Surgeon: Meade Maw, MD;  Location: [facility] OR[facility];  Service: Neurosurgery;   Laterality: N/A;  schedule as first case   APPENDECTOMY  1974   BACK SURGERY     BREAST BIOPSY Right 02/15/2021   Q clip, [place] bx, 9:30 5cmfn Usual Ductal Hyperplasia, PASH   BREAST BIOPSY Left 02/15/2021   x clip, [place] bx, 1:00 2cmfn, Usual Ductal Hyperplasia, Apocrine Metaplasia, PASH   BREAST EXCISIONAL BIOPSY Right 1985   Benign cycts removed   CESAREAN SECTION  1993   COLONOSCOPY     LUMBAR FUSION  2010   Dr. March Rummage   LUMBAR FUSION  2013   x 2 Dr. Ellender Hose   TONSILLECTOMY     TUBAL LIGATION  1974   UPPER GI ENDOSCOPY     Patient Active Problem List   Diagnosis Date Noted   Spondylolisthesis 09/30/2020   Chronic left-sided low back pain with left-sided sciatica 04/19/2020   Osteoporosis, post-menopausal 08/13/2019   Vitamin D insufficiency 08/13/2019   Hyperlipidemia 08/02/2019   Degenerative joint disease 08/02/2019   GERD (gastroesophageal reflux disease) 08/02/2019   Hypothyroid 08/02/2019   Osteoporosis 08/02/2019   Sleep apnea 08/02/2019   Tobacco use disorder 07/02/2019   Carotid stenosis 07/02/2019   Lymphadenitis, chronic 06/14/2019   Atherosclerosis of arteries 05/04/2018   Pulmonary emphysema ([place]) 05/04/2018   Hx of adenomatous colonic polyps 05/02/2018   Skin cancer 06/17/2016  Post laminectomy syndrome 04/24/2012   Essential (primary) hypertension 11/19/1951    PCP: Idelle Crouch, MD  REFERRING PROVIDER: Meade Maw, MD  REFERRING DIAGNOSIS: Cervicalgia, s/p arthrodesis  THERAPY DIAG: Cervicalgia  RATIONALE FOR EVALUATION AND TREATMENT: Rehabilitation  ONSET DATE: 07/09/21  FOLLOW UP APPT WITH PROVIDER: No, pt advised to call if needed    SUBJECTIVE:                                                                                                                                                                                         Chief Complaint: Imbalance  Pertinent History Pt referred for imbalance. She states that the balance  issues started after her L5-S1 ALIF surgery in 2021. She has had multiple lumbar fusions however the balance issues never appeared until the most recent lumbar surgery. She also recently underwent C4-C5 ACDF on 07/09/2021. She reports persistent UE/LE weakness since that time. She never had PT after her neck surgery however reports that she doesn't have issues at this time related to her neck. Pt reports worsening stress urinary incontinence as well.  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, fishing [employer] (bowel/bladder changes, saddle paresthesia, personal history of cancer, h/o spinal tumors, h/o compression fx, chills/fever, night sweats, nausea, vomiting): Negative  Precautions: None  Weight Bearing Restrictions: No, no lifting restrictions reported by patient or identified in the medical record  [place] with: lives with their spouse Lives in: House/apartment, two levels, bedroom and bathroom on main floor, walk-in shower with built in seat and grab bars, 5 stairs from garage into the house with L railing during ascend Has following equipment at home: Single point cane, Walker - 2 wheeled, and Grab bars  Patient Goals: Improve balance and balance confidence    OBJECTIVE:   Patient Surveys  FOTO: 55, predicted improvement to 67 ABC: 42.5%  Cognition Patient is oriented to person, place, and time.  Recent memory is  intact.  Remote memory is intact.  Attention span and concentration are intact.  Expressive speech is intact.  Patient's fund of knowledge is within normal limits for educational level.    Gross Musculoskeletal Assessment Tremor:  None Bulk: Normal Tone: Normal  GAIT: Full gait assessment deferred however self-selected gait speed is decreased  Posture: Full posture assessment deferred. However pt with forward head and rounded shoulders as well as flattened low back (loss of natural lordosis);   AROM: Deferred  LE MMT:  MMT (out of 5) Right 05/26/2022 Left 05/26/2022  Hip flexion 5 5  Hip extension    Hip abduction (seated) 4+ 4+  Hip adduction (seated) 4+ 4+  Hip internal rotation    Hip external rotation    Knee flexion 4+ 4+  Knee extension 5 5  Ankle dorsiflexion 4+ 4+  Ankle plantarflexion    Ankle inversion    Ankle eversion    (* = pain; Blank rows = not tested)  Sensation Deferred  Reflexes Deferred  Cranial Nerves Deferred  Coordination/Cerebellar Deferred   FUNCTIONAL OUTCOME MEASURES   Results Comments  BERG 51/56 Fall risk, in need of intervention  DGI    FGA    TUG 12.4s WNL  5TSTS 11.5s WNL  6 Minute Walk Test    10 Meter Gait Speed    (Blank rows = not tested)   TODAY'S TREATMENT  Deferred   PATIENT EDUCATION:  Education details: Plan of care Person educated: Patient Education method: Explanation Education comprehension: verbalized understanding   HOME EXERCISE PROGRAM: None currently   ASSESSMENT:  CLINICAL IMPRESSION: Patient is a 71 y.o. female who was seen today for physical therapy evaluation and treatment for imbalance. Objective impairments include Abnormal gait, decreased balance, decreased strength, and impaired sensation. These impairments are limiting patient from meal prep, cleaning, laundry, shopping, and community activity. Personal factors including Age, Fitness, Past/current experiences, Time since onset of  injury/illness/exacerbation, and 3+ comorbidities: chronic back pain, COPD, CAD, and osteoporosis  are also affecting patient's functional outcome. Patient will benefit from skilled PT to address above impairments and improve overall function.  REHAB POTENTIAL: Fair    CLINICAL DECISION MAKING: Unstable/unpredictable  EVALUATION COMPLEXITY: High   GOALS:  SHORT TERM GOALS: Target date: 06/23/2022  Pt will be independent with HEP in order to improve strength and balance in order to decrease fall risk and improve function at home. Baseline:  Goal status: INITIAL   LONG TERM GOALS: Target date: 07/21/2022  Pt will increase FOTO to at least 61 to demonstrate significant improvement in function at home related to balance  Baseline: 05/25/22: 55 Goal status: INITIAL  2.  Pt will improve BERG by at least 3 points in order to demonstrate clinically significant improvement in balance.   Baseline: 05/25/21: 51/56 Goal status: INITIAL  3.  Pt will improve ABC by at least 13% in order to demonstrate clinically significant improvement in balance confidence.      Baseline: 05/25/22: 42.5% Goal status: INITIAL  4.  Pt will increase 2MWT by at least 40' in order to demonstrate clinically significant improvement in cardiopulmonary endurance, balance, and community ambulation   Baseline: 05/25/22: To be completed Goal status: INITIAL   PLAN: PT FREQUENCY: 2x/week  PT DURATION: 8 weeks  PLANNED INTERVENTIONS: Therapeutic exercises, Therapeutic activity, Neuromuscular re-education, Balance training, Gait training, Patient/Family education, Joint manipulation, Joint mobilization, Canalith repositioning, Aquatic Therapy, Dry Needling, Cognitive remediation, Electrical stimulation, Spinal manipulation, Spinal mobilization, Cryotherapy, Moist heat, Traction, Ultrasound, Ionotophoresis '4mg'$ /ml Dexamethasone, and Manual therapy  PLAN FOR NEXT SESSION: 2MWT, 26mgait speed, test sensation, finish strength  testing, issue HEP, initiate balance and strength exercises   JLyndel SafeHuprich PT, DPT, GCS  Zayn Selley 05/26/2022, 1:09 PM

## 2022-06-02 ENCOUNTER — Ambulatory Visit: Payer: Medicare Other

## 2022-06-03 ENCOUNTER — Ambulatory Visit
Admission: RE | Admit: 2022-06-03 | Discharge: 2022-06-03 | Disposition: A | Discharge status: Home / Self Care | Payer: Medicare Other | Source: Ambulatory Visit | Attending: Internal Medicine | Admitting: Internal Medicine

## 2022-06-03 DIAGNOSIS — Z87891 Personal history of nicotine dependence: Secondary | ICD-10-CM | POA: Insufficient documentation

## 2022-06-03 DIAGNOSIS — Z122 Encounter for screening for malignant neoplasm of respiratory organs: Secondary | ICD-10-CM | POA: Diagnosis present

## 2022-06-06 ENCOUNTER — Other Ambulatory Visit: Payer: Self-pay

## 2022-06-06 DIAGNOSIS — Z122 Encounter for screening for malignant neoplasm of respiratory organs: Secondary | ICD-10-CM

## 2022-06-06 DIAGNOSIS — Z87891 Personal history of nicotine dependence: Secondary | ICD-10-CM

## 2022-06-06 NOTE — Therapy (Signed)
OUTPATIENT PHYSICAL THERAPY BALANCE TREATMENT   Patient Name: Carol Carey MRN: [id number] DOB:11-Jul-1951, 71 y.o., female Today's Date: 06/10/2022   PT End of Session - 06/09/22 1657     Visit Number 2    Number of Visits 17    Date for PT Re-Evaluation 07/20/22    Authorization Type eval: 05/25/22    PT Start Time 1700    PT Stop Time 1745    PT Time Calculation (min) 45 min    Activity Tolerance Patient tolerated treatment well    Behavior During Therapy [facility] for tasks assessed/performed              Past Medical History:  Diagnosis Date   Aortic atherosclerosis ([place])    Arthritis    Ascending aorta dilatation ([place])    a.) measured 3.9cm by TTE on 06/29/21   CAD (coronary artery disease)    Carotid artery stenosis    Colon polyp    COPD (chronic obstructive pulmonary disease) ([place])    Degenerative joint disease    GERD (gastroesophageal reflux disease)    Hepatic steatosis    Hyperlipidemia    Hypertension    Hypothyroidism    Osteoporosis    Pulmonary emphysema ([facility])    Seasonal allergies    Skin cancer    lesion removed from nose   Sleep apnea    not on nocturnal PAP therapy   Spinal stenosis    Valvular insufficiency    a.) TTE on 06/29/2021 --> moderate AR, trivial MR/TR, mild PR   Past Surgical History:  Procedure Laterality Date   ABDOMINAL EXPOSURE N/A 09/30/2020   Procedure: ABDOMINAL EXPOSURE;  Surgeon: Algernon Huxley, MD;  Location: [facility] OR[facility];  Service: Vascular;  Laterality: N/A;   ABDOMINAL HYSTERECTOMY  1993   ANTERIOR AND POSTERIOR SPINAL FUSION N/A 09/30/2020   Procedure: L5-S1 ANTERIOR LUMBAR INTERBODY FUSION, L3-S1 INSTRUMENTATION;  Surgeon: Meade Maw, MD;  Location: [facility];  Service: Neurosurgery;  Laterality: N/A;   ANTERIOR CERVICAL DECOMP/DISCECTOMY FUSION N/A 07/09/2021   Procedure: C4-5 ANTERIOR CERVICAL DECOMPRESSION/DISCECTOMY FUSION 1 LEVEL;  Surgeon: Meade Maw, MD;  Location: [facility] OR[facility];  Service: Neurosurgery;   Laterality: N/A;  schedule as first case   APPENDECTOMY  1974   BACK SURGERY     BREAST BIOPSY Right 02/15/2021   Q clip, [place] bx, 9:30 5cmfn Usual Ductal Hyperplasia, PASH   BREAST BIOPSY Left 02/15/2021   x clip, [place] bx, 1:00 2cmfn, Usual Ductal Hyperplasia, Apocrine Metaplasia, PASH   BREAST EXCISIONAL BIOPSY Right 1985   Benign cycts removed   CESAREAN SECTION  1993   COLONOSCOPY     LUMBAR FUSION  2010   Dr. March Rummage   LUMBAR FUSION  2013   x 2 Dr. Ellender Hose   TONSILLECTOMY     TUBAL LIGATION  1974   UPPER GI ENDOSCOPY     Patient Active Problem List   Diagnosis Date Noted   Spondylolisthesis 09/30/2020   Chronic left-sided low back pain with left-sided sciatica 04/19/2020   Osteoporosis, post-menopausal 08/13/2019   Vitamin D insufficiency 08/13/2019   Hyperlipidemia 08/02/2019   Degenerative joint disease 08/02/2019   GERD (gastroesophageal reflux disease) 08/02/2019   Hypothyroid 08/02/2019   Osteoporosis 08/02/2019   Sleep apnea 08/02/2019   Tobacco use disorder 07/02/2019   Carotid stenosis 07/02/2019   Lymphadenitis, chronic 06/14/2019   Atherosclerosis of arteries 05/04/2018   Pulmonary emphysema ([place]) 05/04/2018   Hx of adenomatous colonic polyps 05/02/2018   Skin cancer 06/17/2016  Post laminectomy syndrome 04/24/2012   Essential (primary) hypertension 10-01-1951    PCP: Idelle Crouch, MD  REFERRING PROVIDER: Meade Maw, MD  REFERRING DIAGNOSIS: Cervicalgia, s/p arthrodesis  THERAPY DIAG: Cervicalgia  RATIONALE FOR EVALUATION AND TREATMENT: Rehabilitation  ONSET DATE: 07/09/21  FOLLOW UP APPT WITH PROVIDER: No, pt advised to call if needed   FROM INITIAL EVALUATION  SUBJECTIVE:                                                                                                                                                                                         Chief Complaint: Imbalance  Pertinent History Pt referred for imbalance. She  states that the balance issues started after her L5-S1 ALIF surgery in 2021. She has had multiple lumbar fusions however the balance issues never appeared until the most recent lumbar surgery. She also recently underwent C4-C5 ACDF on 07/09/2021. She reports persistent UE/LE weakness since that time. She never had PT after her neck surgery however reports that she doesn't have issues at this time related to her neck. Pt reports worsening stress urinary incontinence as well.  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, fishing [employer] (bowel/bladder changes, saddle paresthesia, personal history of cancer, h/o spinal tumors, h/o compression fx, chills/fever, night sweats, nausea, vomiting): Negative  Precautions: None  Weight Bearing Restrictions: No, no lifting restrictions reported by patient or identified in the medical record  [place] with: lives with their spouse Lives in: House/apartment, two levels, bedroom and bathroom on main floor, walk-in shower with built in seat and grab bars, 5 stairs from garage into the house with L railing during ascend Has following equipment at home: Single point cane, Walker - 2 wheeled, and Grab bars  Patient Goals: Improve balance and balance confidence    OBJECTIVE:   Patient Surveys  FOTO: 55, predicted improvement to 48 ABC: 42.5%  Cognition Patient is oriented to person, place, and  time.  Recent memory is intact.  Remote memory is intact.  Attention span and concentration are intact.  Expressive speech is intact.  Patient's fund of knowledge is within normal limits for educational level.    Johney Maine  Musculoskeletal Assessment Tremor: None Bulk: Normal Tone: Normal  GAIT: Full gait assessment deferred however self-selected gait speed is decreased  Posture: Full posture assessment deferred. However pt with forward head and rounded shoulders as well as flattened low back (loss of natural lordosis);   AROM: Deferred  LE MMT:  MMT (out of 5) Right 06/10/2022 Left 06/10/2022  Hip flexion 5 5  Hip extension    Hip abduction (seated) 4+ 4+  Hip adduction (seated) 4+ 4+  Hip internal rotation    Hip external rotation    Knee flexion 4+ 4+  Knee extension 5 5  Ankle dorsiflexion 4+ 4+  Ankle plantarflexion    Ankle inversion    Ankle eversion    (* = pain; Blank rows = not tested)  Sensation Deferred  Reflexes Deferred  Cranial Nerves Deferred  Coordination/Cerebellar Deferred   FUNCTIONAL OUTCOME MEASURES   Results Comments  BERG 51/56 Fall risk, in need of intervention  DGI    FGA    TUG 12.4s WNL  5TSTS 11.5s WNL  6 Minute Walk Test    10 Meter Gait Speed    (Blank rows = not tested)   TODAY'S TREATMENT   SUBJECTIVE: Pt reports that she is doing well today. She denies any significant changes since the initial evaluation. No specific areas of pain upon arrival today. No specific questions or concerns currently.   Ther-ex   LE MMT:  MMT (out of 5) Right 06/09/2022 Left 06/09/2022  Hip flexion 5 5  Hip extension 3+ 3+  Hip abduction  3+ 3+  Hip adduction  4+ 4  Hip internal rotation 5 5  Hip external rotation 4+ 5  Knee flexion 4 4  Knee extension 5 5  Ankle dorsiflexion 4+ 4+  Ankle plantarflexion    Ankle inversion    Ankle eversion    (* = pain; Blank rows = not tested)  Sit to stand without UE support x 10; Seated clams  with green tband x 10; Seated adductor ball squeeze x 10; Seated LAQ with 3# AW x 10 BLE;  Standing exercises with 3# ankle weights (AW): Hip flexion marches x 10 BLE; Hip abduction x 10 BLE; Hamstring curls x 10 BLE; Hip extension x 10 BLE;  HEP issued and education provided;   Neuromuscular Re-education  2MWT: 37' without assistive device, pt denies DOE during or after ambulation; Pre-vitals: BP: 139/68, HR: 79, SpO2%: 97%  Post vitals: BP: 156/69, HR: 87, SpO2%: 94% 23mgait speed: Self-selected: 12.7s = 0.79 m/s, Fastest: 10.7s = 0.93 m/s; Sensation: WNL C2-T2 and L1-S2; Tandem balance alternating forward LE 2 x 30s each; Alternating 6" step taps x 10 BLE;   PATIENT EDUCATION:  Education details: Plan of care Person educated: Patient Education method: Explanation Education comprehension: verbalized understanding   HOME EXERCISE PROGRAM: Access Code: [email or web address]: [email or web address] Date: 06/09/2022 Prepared by: JRoxana Hires Exercises - Seated Hip Abduction with Resistance  - 1 x daily - 7 x weekly - 2 sets - 10 reps - 3s hold - Seated Hip Adduction Isometrics with Ball  - 1 x daily - 7 x weekly - 2 sets - 10 reps - 3s hold - Seated March with Resistance  - 1 x daily - 7 x weekly - 2 sets - 10 reps - 3s hold - Standing Tandem Balance with Counter Support  - 1 x daily - 7 x weekly - 2 x 30s with each leg forward hold - Single Leg Stance with  Support  - 1 x daily - 7 x weekly - 2 x 30s with each leg forward hold   ASSESSMENT:  CLINICAL IMPRESSION: Updated additional outcome measures with patient. Significant weakness noted in bilateral hip extension and abduction. Sensation is WNL. Decreased self-selected and fastest 64mgait speed. Initiated strength and balance exercises during session and HEP issued. Pt encouraged to follow-up as scheduled. She will benefit from skilled PT to address weakness and imbalance and improve overall function.  REHAB  POTENTIAL: Fair    CLINICAL DECISION MAKING: Unstable/unpredictable  EVALUATION COMPLEXITY: High   GOALS:  SHORT TERM GOALS: Target date: 07/08/2022  Pt will be independent with HEP in order to improve strength and balance in order to decrease fall risk and improve function at home. Baseline:  Goal status: INITIAL   LONG TERM GOALS: Target date: 08/05/2022  Pt will increase FOTO to at least 61 to demonstrate significant improvement in function at home related to balance  Baseline: 05/25/22: 55 Goal status: INITIAL  2.  Pt will improve BERG by at least 3 points in order to demonstrate clinically significant improvement in balance.   Baseline: 05/25/21: 51/56 Goal status: INITIAL  3.  Pt will improve ABC by at least 13% in order to demonstrate clinically significant improvement in balance confidence.      Baseline: 05/25/22: 42.5% Goal status: INITIAL  4.  Pt will increase 2MWT by at least 40' in order to demonstrate clinically significant improvement in cardiopulmonary endurance, balance, and community ambulation   Baseline: 05/25/22: To be completed; 06/09/22: 322' without assistive device  Goal status: INITIAL   PLAN: PT FREQUENCY: 2x/week  PT DURATION: 8 weeks  PLANNED INTERVENTIONS: Therapeutic exercises, Therapeutic activity, Neuromuscular re-education, Balance training, Gait training, Patient/Family education, Joint manipulation, Joint mobilization, Canalith repositioning, Aquatic Therapy, Dry Needling, Cognitive remediation, Electrical stimulation, Spinal manipulation, Spinal mobilization, Cryotherapy, Moist heat, Traction, Ultrasound, Ionotophoresis '4mg'$ /ml Dexamethasone, and Manual therapy  PLAN FOR NEXT SESSION: review/modify HEP as needed, progress balance and strength exercises   JLyndel SafeHuprich PT, DPT, GCS  Surabhi Gadea 06/10/2022, 2:39 PM

## 2022-06-09 ENCOUNTER — Ambulatory Visit: Payer: Medicare Other | Attending: Neurosurgery

## 2022-06-09 DIAGNOSIS — M542 Cervicalgia: Secondary | ICD-10-CM | POA: Diagnosis not present

## 2022-06-13 NOTE — Therapy (Signed)
OUTPATIENT PHYSICAL THERAPY BALANCE TREATMENT   Patient Name: Carol Carey MRN: [id number] DOB:11-25-1951, 71 y.o., female Today's Date: 06/14/2022   PT End of Session - 06/14/22 1318     Visit Number 3    Number of Visits 17    Date for PT Re-Evaluation 07/20/22    Authorization Type eval: 05/25/22    PT Start Time 1300    PT Stop Time 1345    PT Time Calculation (min) 45 min    Activity Tolerance Patient tolerated treatment well    Behavior During Therapy [facility] for tasks assessed/performed               Past Medical History:  Diagnosis Date   Aortic atherosclerosis ([place])    Arthritis    Ascending aorta dilatation ([place])    a.) measured 3.9cm by TTE on 06/29/21   CAD (coronary artery disease)    Carotid artery stenosis    Colon polyp    COPD (chronic obstructive pulmonary disease) ([place])    Degenerative joint disease    GERD (gastroesophageal reflux disease)    Hepatic steatosis    Hyperlipidemia    Hypertension    Hypothyroidism    Osteoporosis    Pulmonary emphysema (HCC)    Seasonal allergies    Skin cancer    lesion removed from nose   Sleep apnea    not on nocturnal PAP therapy   Spinal stenosis    Valvular insufficiency    a.) TTE on 06/29/2021 --> moderate AR, trivial MR/TR, mild PR   Past Surgical History:  Procedure Laterality Date   ABDOMINAL EXPOSURE N/A 09/30/2020   Procedure: ABDOMINAL EXPOSURE;  Surgeon: Algernon Huxley, MD;  Location: [facility] ORS;  Service: Vascular;  Laterality: N/A;   ABDOMINAL HYSTERECTOMY  1993   ANTERIOR AND POSTERIOR SPINAL FUSION N/A 09/30/2020   Procedure: L5-S1 ANTERIOR LUMBAR INTERBODY FUSION, L3-S1 INSTRUMENTATION;  Surgeon: Meade Maw, MD;  Location: [facility] ORS;  Service: Neurosurgery;  Laterality: N/A;   ANTERIOR CERVICAL DECOMP/DISCECTOMY FUSION N/A 07/09/2021   Procedure: C4-5 ANTERIOR CERVICAL DECOMPRESSION/DISCECTOMY FUSION 1 LEVEL;  Surgeon: Meade Maw, MD;  Location: [facility] ORS;  Service:  Neurosurgery;  Laterality: N/A;  schedule as first case   APPENDECTOMY  1974   BACK SURGERY     BREAST BIOPSY Right 02/15/2021   Q clip, [place] bx, 9:30 5cmfn Usual Ductal Hyperplasia, PASH   BREAST BIOPSY Left 02/15/2021   x clip, [place] bx, 1:00 2cmfn, Usual Ductal Hyperplasia, Apocrine Metaplasia, PASH   BREAST EXCISIONAL BIOPSY Right 1985   Benign cycts removed   CESAREAN SECTION  1993   COLONOSCOPY     LUMBAR FUSION  2010   Dr. March Rummage   LUMBAR FUSION  2013   x 2 Dr. Ellender Hose   TONSILLECTOMY     TUBAL LIGATION  1974   UPPER GI ENDOSCOPY     Patient Active Problem List   Diagnosis Date Noted   Spondylolisthesis 09/30/2020   Chronic left-sided low back pain with left-sided sciatica 04/19/2020   Osteoporosis, post-menopausal 08/13/2019   Vitamin D insufficiency 08/13/2019   Hyperlipidemia 08/02/2019   Degenerative joint disease 08/02/2019   GERD (gastroesophageal reflux disease) 08/02/2019   Hypothyroid 08/02/2019   Osteoporosis 08/02/2019   Sleep apnea 08/02/2019   Tobacco use disorder 07/02/2019   Carotid stenosis 07/02/2019   Lymphadenitis, chronic 06/14/2019   Atherosclerosis of arteries 05/04/2018   Pulmonary emphysema ([place]) 05/04/2018   Hx of adenomatous colonic polyps 05/02/2018   Skin cancer 06/17/2016  Post laminectomy syndrome 04/24/2012   Essential (primary) hypertension [birth year]    PCP: Idelle Crouch, MD  REFERRING PROVIDER: Meade Maw, MD  REFERRING DIAGNOSIS: Cervicalgia, s/p arthrodesis  THERAPY DIAG: Cervicalgia  RATIONALE FOR EVALUATION AND TREATMENT: Rehabilitation  ONSET DATE: 07/09/21  FOLLOW UP APPT WITH PROVIDER: No, pt advised to call if needed   FROM INITIAL EVALUATION  SUBJECTIVE:                                                                                                                                                                                         Chief Complaint: Imbalance  Pertinent History Pt referred for  imbalance. She states that the balance issues started after her L5-S1 ALIF surgery in 2021. She has had multiple lumbar fusions however the balance issues never appeared until the most recent lumbar surgery. She also recently underwent C4-C5 ACDF on 07/09/2021. She reports persistent UE/LE weakness since that time. She never had PT after her neck surgery however reports that she doesn't have issues at this time related to her neck. Pt reports worsening stress urinary incontinence as well.  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, fishing [employer] (bowel/bladder changes, saddle paresthesia, personal history of cancer, h/o spinal tumors, h/o compression fx, chills/fever, night sweats, nausea, vomiting): Negative  Precautions: None  Weight Bearing Restrictions: No, no lifting restrictions reported by patient or identified in the medical record  [place] with: lives with their spouse Lives in: House/apartment, two levels, bedroom and bathroom on main floor, walk-in shower with built in seat and grab bars, 5 stairs from garage into the house with L railing during ascend Has following equipment at home: Single point cane, Walker - 2 wheeled, and Grab bars  Patient Goals: Improve balance and balance confidence    OBJECTIVE:   Patient Surveys  FOTO: 55, predicted improvement to 50 ABC: 42.5%  Cognition Patient is oriented to person,  place, and time.  Recent memory is intact.  Remote memory is intact.  Attention span and concentration are intact.  Expressive speech is intact.  Patient's fund of knowledge is within normal limits for educational level.    Johney Maine  Musculoskeletal Assessment Tremor: None Bulk: Normal Tone: Normal  GAIT: Full gait assessment deferred however self-selected gait speed is decreased  Posture: Full posture assessment deferred. However pt with forward head and rounded shoulders as well as flattened low back (loss of natural lordosis);   AROM: Deferred  LE MMT:  MMT (out of 5) Right 06/14/2022 Left 06/14/2022  Hip flexion 5 5  Hip extension    Hip abduction (seated) 4+ 4+  Hip adduction (seated) 4+ 4+  Hip internal rotation    Hip external rotation    Knee flexion 4+ 4+  Knee extension 5 5  Ankle dorsiflexion 4+ 4+  Ankle plantarflexion    Ankle inversion    Ankle eversion    (* = pain; Blank rows = not tested)  Sensation Deferred  Reflexes Deferred  Cranial Nerves Deferred  Coordination/Cerebellar Deferred   FUNCTIONAL OUTCOME MEASURES   Results Comments  BERG 51/56 Fall risk, in need of intervention  DGI    FGA    TUG 12.4s WNL  5TSTS 11.5s WNL  6 Minute Walk Test    10 Meter Gait Speed    (Blank rows = not tested)   TODAY'S TREATMENT   SUBJECTIVE: Pt reports that she is doing well today. She denies any significant changes since the las therapy. Her left great toe continues to be painful due to a recent infection but no current pain related to her surgery. No specific questions or concerns currently.    Ther-ex  NuStep L1-2 x 5 minutes for warm-up during interval history (2 minutes unbilled); Sit to stand holding 4# medicine ball 2 x 10; Seated LAQ with 4# AW x 10 BLE;  Standing exercises with 4# ankle weights (AW): Hip flexion marches x 10 BLE; Hip abduction x 10 BLE; Hamstring curls x 10 BLE; Hip extension x 10 BLE;  Forward 6" step-ups  without UE support x 10 with each leg; Lateral 6" step-ups without UE support x 10 toward each side;   Neuromuscular Re-education  All balance exercises performed without UE support unless otherwise specified: Tandem balance alternating forward LE 2 x 30s each; Tandem gait in // bars x multiple laps; Cross-over side stepping in // bars x multiple laps; Alternating 6" cone taps x 10 BLE, multiple episodes of knocking over cone; Obstacle course with 6" hurdles in // bars with forward gait as well as lateral stepping x multiple bouts each direction;   Not performed: Seated clams with green tband x 10; Seated adductor ball squeeze x 10;   PATIENT EDUCATION:  Education details: Pt educated throughout session about proper posture and technique with exercises. Improved exercise technique, movement at target joints, use of target muscles after min to mod verbal, visual, tactile cues.  Person educated: Patient Education method: Explanation Education comprehension: verbalized understanding   HOME EXERCISE PROGRAM: Access Code: [email or web address] URL: [email or web address] Date: 06/09/2022 Prepared by: Roxana Hires  Exercises - Seated Hip Abduction with Resistance  - 1 x daily - 7 x weekly - 2 sets - 10 reps - 3s hold - Seated Hip Adduction Isometrics with Ball  - 1 x daily - 7 x weekly - 2 sets - 10 reps - 3s hold - Seated March with Resistance  - 1 x daily - 7 x weekly - 2 sets - 10 reps - 3s hold - Standing Tandem Balance with Counter Support  - 1 x daily - 7 x weekly - 2 x 30s with each leg forward hold - Single Leg Stance with  Support  - 1 x daily - 7 x weekly - 2 x 30s with each leg forward hold   ASSESSMENT:  CLINICAL IMPRESSION: Pt demonstrates excellent motivation during session today. Progressed strength and balance exercises with patient during session. She denies any increase in pain but does have some occasional fatigue and DOE requiring seated rest breaks between  sets. No HEP modifications on this date. Pt encouraged to schedule with podiatry if her L great toe continues to give her pain. Continue HEP and follow-up as scheduled. She will benefit from skilled PT to address weakness and imbalance and improve overall function.  REHAB POTENTIAL: Fair    CLINICAL DECISION MAKING: Unstable/unpredictable  EVALUATION COMPLEXITY: High   GOALS:  SHORT TERM GOALS: Target date: 07/12/2022  Pt will be independent with HEP in order to improve strength and balance in order to decrease fall risk and improve function at home. Baseline:  Goal status: INITIAL   LONG TERM GOALS: Target date: 08/09/2022  Pt will increase FOTO to at least 61 to demonstrate significant improvement in function at home related to balance  Baseline: 05/25/22: 55 Goal status: INITIAL  2.  Pt will improve BERG by at least 3 points in order to demonstrate clinically significant improvement in balance.   Baseline: 05/25/21: 51/56 Goal status: INITIAL  3.  Pt will improve ABC by at least 13% in order to demonstrate clinically significant improvement in balance confidence.      Baseline: 05/25/22: 42.5% Goal status: INITIAL  4.  Pt will increase 2MWT by at least 40' in order to demonstrate clinically significant improvement in cardiopulmonary endurance, balance, and community ambulation   Baseline: 05/25/22: To be completed; 06/09/22: 322' without assistive device  Goal status: INITIAL   PLAN: PT FREQUENCY: 2x/week  PT DURATION: 8 weeks  PLANNED INTERVENTIONS: Therapeutic exercises, Therapeutic activity, Neuromuscular re-education, Balance training, Gait training, Patient/Family education, Joint manipulation, Joint mobilization, Canalith repositioning, Aquatic Therapy, Dry Needling, Cognitive remediation, Electrical stimulation, Spinal manipulation, Spinal mobilization, Cryotherapy, Moist heat, Traction, Ultrasound, Ionotophoresis '4mg'$ /ml Dexamethasone, and Manual therapy  PLAN FOR NEXT  SESSION: review/modify HEP as needed, progress balance and strength exercises   Lyndel Safe Justinn Welter PT, DPT, GCS  Jodel Mayhall 06/14/2022, 2:05 PM

## 2022-06-14 ENCOUNTER — Ambulatory Visit: Payer: Medicare Other

## 2022-06-14 DIAGNOSIS — M542 Cervicalgia: Secondary | ICD-10-CM

## 2022-06-16 ENCOUNTER — Ambulatory Visit: Payer: Medicare Other

## 2022-06-16 DIAGNOSIS — M542 Cervicalgia: Secondary | ICD-10-CM | POA: Diagnosis not present

## 2022-06-16 NOTE — Therapy (Signed)
OUTPATIENT PHYSICAL THERAPY BALANCE TREATMENT   Patient Name: NORIE LATENDRESSE MRN: [id number] DOB:October 10, 1951, 71 y.o., female Today's Date: 06/16/2022   PT End of Session - 06/16/22 1312     Visit Number 4    Number of Visits 17    Date for PT Re-Evaluation 07/20/22    Authorization Type eval: 05/25/22    PT Start Time 1315    PT Stop Time 1400    PT Time Calculation (min) 45 min    Activity Tolerance Patient tolerated treatment well    Behavior During Therapy [facility] for tasks assessed/performed             Past Medical History:  Diagnosis Date   Aortic atherosclerosis ([place])    Arthritis    Ascending aorta dilatation ([place])    a.) measured 3.9cm by TTE on 06/29/21   CAD (coronary artery disease)    Carotid artery stenosis    Colon polyp    COPD (chronic obstructive pulmonary disease) ([place])    Degenerative joint disease    GERD (gastroesophageal reflux disease)    Hepatic steatosis    Hyperlipidemia    Hypertension    Hypothyroidism    Osteoporosis    Pulmonary emphysema (H[facility])    Seasonal allergies    Skin cancer    lesion removed from nose   Sleep apnea    not on nocturnal PAP therapy   Spinal stenosis    Valvular insufficiency    a.) TTE on 06/29/2021 --> moderate AR, trivial MR/TR, mild PR   Past Surgical History:  Procedure Laterality Date   ABDOMINAL EXPOSURE N/A 09/30/2020   Procedure: ABDOMINAL EXPOSURE;  Surgeon: Algernon Huxley, MD;  Location: [facility] ORS;  Service: Vascular;  Laterality: N/A;   ABDOMINAL HYSTERECTOMY  1993   ANTERIOR AND POSTERIOR SPINAL FUSION N/A 09/30/2020   Procedure: L5-S1 ANTERIOR LUMBAR INTERBODY FUSION, L3-S1 INSTRUMENTATION;  Surgeon: Meade Maw, MD;  Location: [facility] ORS;  Service: Neurosurgery;  Laterality: N/A;   ANTERIOR CERVICAL DECOMP/DISCECTOMY FUSION N/A 07/09/2021   Procedure: C4-5 ANTERIOR CERVICAL DECOMPRESSION/DISCECTOMY FUSION 1 LEVEL;  Surgeon: Meade Maw, MD;  Location: [facility] ORS;  Service: Neurosurgery;   Laterality: N/A;  schedule as first case   APPENDECTOMY  1974   BACK SURGERY     BREAST BIOPSY Right 02/15/2021   Q clip, [place] bx, 9:30 5cmfn Usual Ductal Hyperplasia, PASH   BREAST BIOPSY Left 02/15/2021   x clip, [place] bx, 1:00 2cmfn, Usual Ductal Hyperplasia, Apocrine Metaplasia, PASH   BREAST EXCISIONAL BIOPSY Right 1985   Benign cycts removed   CESAREAN SECTION  1993   COLONOSCOPY     LUMBAR FUSION  2010   Dr. March Rummage   LUMBAR FUSION  2013   x 2 Dr. Ellender Hose   TONSILLECTOMY     TUBAL LIGATION  1974   UPPER GI ENDOSCOPY     Patient Active Problem List   Diagnosis Date Noted   Spondylolisthesis 09/30/2020   Chronic left-sided low back pain with left-sided sciatica 04/19/2020   Osteoporosis, post-menopausal 08/13/2019   Vitamin D insufficiency 08/13/2019   Hyperlipidemia 08/02/2019   Degenerative joint disease 08/02/2019   GERD (gastroesophageal reflux disease) 08/02/2019   Hypothyroid 08/02/2019   Osteoporosis 08/02/2019   Sleep apnea 08/02/2019   Tobacco use disorder 07/02/2019   Carotid stenosis 07/02/2019   Lymphadenitis, chronic 06/14/2019   Atherosclerosis of arteries 05/04/2018   Pulmonary emphysema ([place]) 05/04/2018   Hx of adenomatous colonic polyps 05/02/2018   Skin cancer 06/17/2016  Post laminectomy syndrome 04/24/2012   Essential (primary) hypertension 1951-11-17    PCP: Idelle Crouch, MD  REFERRING PROVIDER: Meade Maw, MD  REFERRING DIAGNOSIS: Cervicalgia, s/p arthrodesis  THERAPY DIAG: Cervicalgia  RATIONALE FOR EVALUATION AND TREATMENT: Rehabilitation  ONSET DATE: 07/09/21  FOLLOW UP APPT WITH PROVIDER: No, pt advised to call if needed   FROM INITIAL EVALUATION  SUBJECTIVE:                                                                                                                                                                                         Chief Complaint: Imbalance  Pertinent History Pt referred for imbalance. She  states that the balance issues started after her L5-S1 ALIF surgery in 2021. She has had multiple lumbar fusions however the balance issues never appeared until the most recent lumbar surgery. She also recently underwent C4-C5 ACDF on 07/09/2021. She reports persistent UE/LE weakness since that time. She never had PT after her neck surgery however reports that she doesn't have issues at this time related to her neck. Pt reports worsening stress urinary incontinence as well.  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, fishing [employer] (bowel/bladder changes, saddle paresthesia, personal history of cancer, h/o spinal tumors, h/o compression fx, chills/fever, night sweats, nausea, vomiting): Negative  Precautions: None  Weight Bearing Restrictions: No, no lifting restrictions reported by patient or identified in the medical record  [place] with: lives with their spouse Lives in: House/apartment, two levels, bedroom and bathroom on main floor, walk-in shower with built in seat and grab bars, 5 stairs from garage into the house with L railing during ascend Has following equipment at home: Single point cane, Walker - 2 wheeled, and Grab bars  Patient Goals: Improve balance and balance confidence    OBJECTIVE:   Patient Surveys  FOTO: 55, predicted improvement to 74 ABC: 42.5%  Cognition Patient is oriented to person, place, and  time.  Recent memory is intact.  Remote memory is intact.  Attention span and concentration are intact.  Expressive speech is intact.  Patient's fund of knowledge is within normal limits for educational level.    Johney Maine  Musculoskeletal Assessment Tremor: None Bulk: Normal Tone: Normal  GAIT: Full gait assessment deferred however self-selected gait speed is decreased  Posture: Full posture assessment deferred. However pt with forward head and rounded shoulders as well as flattened low back (loss of natural lordosis);   AROM: Deferred  LE MMT:  MMT (out of 5) Right 06/16/2022 Left 06/16/2022  Hip flexion 5 5  Hip extension    Hip abduction (seated) 4+ 4+  Hip adduction (seated) 4+ 4+  Hip internal rotation    Hip external rotation    Knee flexion 4+ 4+  Knee extension 5 5  Ankle dorsiflexion 4+ 4+  Ankle plantarflexion    Ankle inversion    Ankle eversion    (* = pain; Blank rows = not tested)  Sensation Deferred  Reflexes Deferred  Cranial Nerves Deferred  Coordination/Cerebellar Deferred   FUNCTIONAL OUTCOME MEASURES   Results Comments  BERG 51/56 Fall risk, in need of intervention  DGI    FGA    TUG 12.4s WNL  5TSTS 11.5s WNL  6 Minute Walk Test    10 Meter Gait Speed    (Blank rows = not tested)   TODAY'S TREATMENT   SUBJECTIVE: Pt reports that she is doing alright today. She denies any resting pain upon arrival. Her left great toe continues to be painful and she has an appointment to see podiatry in a week. No specific questions or concerns currently.   PAIN: L great toe, not rated on NPRS;   Ther-ex  NuStep L1-2 x 5 minutes for warm-up (5 minutes unbilled); Sit to stand holding 6# medicine ball 2 x 10; Forward 6" step-ups without UE support x 10 with each leg; Lateral 6" step-ups without UE support x 10 toward each side;  Seated LAQ with 5# AW x 10 BLE;  Standing exercises with 5# ankle weights (AW): Hip flexion marches x 10 BLE; Hip  abduction x 10 BLE; Hamstring curls x 10 BLE; Hip extension x 10 BLE;     Neuromuscular Re-education  All balance exercises performed without UE support unless otherwise specified: BOSU (round side up) forward lunges x 10 BLE; Incline standing balance with eyes open/closed x 30s each; Airex feet together eyes open/closed x 30s each; Airex feet together horizontal and vertical head turns x 30s each; Tandem balance alternating forward LE x 30s each; Tandem gait in // bars x multiple laps;   Not performed: Seated clams with green tband x 10; Seated adductor ball squeeze x 10; Cross-over side stepping in // bars x multiple laps; Alternating 6" cone taps x 10 BLE, multiple episodes of knocking over cone; Obstacle course with 6" hurdles in // bars with forward gait as well as lateral stepping x multiple bouts each direction;   PATIENT EDUCATION:  Education details: Pt educated throughout session about proper posture and technique with exercises. Improved exercise technique, movement at target joints, use of target muscles after min to mod verbal, visual, tactile cues.  Person educated: Patient Education method: Explanation Education comprehension: verbalized understanding and returned demonstration   HOME EXERCISE PROGRAM: Access Code: [email or web address] URL: [email or web address] Date: 06/09/2022 Prepared by: Roxana Hires  Exercises - Seated Hip Abduction with Resistance  - 1 x daily - 7 x weekly - 2 sets - 10 reps - 3s hold - Seated Hip Adduction Isometrics with Ball  - 1 x daily - 7 x weekly - 2 sets - 10 reps - 3s hold - Seated March with Resistance  - 1 x daily -  7 x weekly - 2 sets - 10 reps - 3s hold - Standing Tandem Balance with Counter Support  - 1 x daily - 7 x weekly - 2 x 30s with each leg forward hold - Single Leg Stance with Support  - 1 x daily - 7 x weekly - 2 x 30s with each leg forward hold   ASSESSMENT:  CLINICAL IMPRESSION: Pt demonstrates  excellent motivation during session today. Progressed strength and balance exercises with patient during session. She denies any increase in pain. Increased ankle weights to five pounds. Challenged pt on unstable Airex and on incline. No HEP modifications on this date. Pt advised to continue HEP and follow-up as scheduled. She will benefit from skilled PT to address weakness and imbalance and improve overall function.  REHAB POTENTIAL: Fair    CLINICAL DECISION MAKING: Unstable/unpredictable  EVALUATION COMPLEXITY: High   GOALS:  SHORT TERM GOALS: Target date: 07/14/2022  Pt will be independent with HEP in order to improve strength and balance in order to decrease fall risk and improve function at home. Baseline:  Goal status: INITIAL   LONG TERM GOALS: Target date: 08/11/2022  Pt will increase FOTO to at least 61 to demonstrate significant improvement in function at home related to balance  Baseline: 05/25/22: 55 Goal status: INITIAL  2.  Pt will improve BERG by at least 3 points in order to demonstrate clinically significant improvement in balance.   Baseline: 05/25/21: 51/56 Goal status: INITIAL  3.  Pt will improve ABC by at least 13% in order to demonstrate clinically significant improvement in balance confidence.      Baseline: 05/25/22: 42.5% Goal status: INITIAL  4.  Pt will increase 2MWT by at least 40' in order to demonstrate clinically significant improvement in cardiopulmonary endurance, balance, and community ambulation   Baseline: 05/25/22: To be completed; 06/09/22: 322' without assistive device  Goal status: INITIAL   PLAN: PT FREQUENCY: 2x/week  PT DURATION: 8 weeks  PLANNED INTERVENTIONS: Therapeutic exercises, Therapeutic activity, Neuromuscular re-education, Balance training, Gait training, Patient/Family education, Joint manipulation, Joint mobilization, Canalith repositioning, Aquatic Therapy, Dry Needling, Cognitive remediation, Electrical stimulation, Spinal  manipulation, Spinal mobilization, Cryotherapy, Moist heat, Traction, Ultrasound, Ionotophoresis '4mg'$ /ml Dexamethasone, and Manual therapy  PLAN FOR NEXT SESSION: review/modify HEP as needed, progress balance and strength exercises   Lyndel Safe Samul Mcinroy PT, DPT, GCS  Natausha Jungwirth 06/16/2022, 3:06 PM

## 2022-06-20 NOTE — Therapy (Signed)
OUTPATIENT PHYSICAL THERAPY BALANCE TREATMENT   Patient Name: Carol Carey MRN: [id number] DOB:[birth year], 71 y.o., female Today's Date: 06/21/2022   PT End of Session - 06/21/22 1315     Visit Number 5    Number of Visits 17    Date for PT Re-Evaluation 07/20/22    Authorization Type eval: 05/25/22    PT Start Time 1315    PT Stop Time 1400    PT Time Calculation (min) 45 min    Activity Tolerance Patient tolerated treatment well    Behavior During Therapy [facility] for tasks assessed/performed              Past Medical History:  Diagnosis Date   Aortic atherosclerosis ([place])    Arthritis    Ascending aorta dilatation ([place])    a.) measured 3.9cm by TTE on 06/29/21   CAD (coronary artery disease)    Carotid artery stenosis    Colon polyp    COPD (chronic obstructive pulmonary disease) ([place])    Degenerative joint disease    GERD (gastroesophageal reflux disease)    Hepatic steatosis    Hyperlipidemia    Hypertension    Hypothyroidism    Osteoporosis    Pulmonary emphysema ([facility])    Seasonal allergies    Skin cancer    lesion removed from nose   Sleep apnea    not on nocturnal PAP therapy   Spinal stenosis    Valvular insufficiency    a.) TTE on 06/29/2021 --> moderate AR, trivial MR/TR, mild PR   Past Surgical History:  Procedure Laterality Date   ABDOMINAL EXPOSURE N/A 09/30/2020   Procedure: ABDOMINAL EXPOSURE;  Surgeon: Algernon Huxley, MD;  Location: [facility] ORS;  Service: Vascular;  Laterality: N/A;   ABDOMINAL HYSTERECTOMY  1993   ANTERIOR AND POSTERIOR SPINAL FUSION N/A 09/30/2020   Procedure: L5-S1 ANTERIOR LUMBAR INTERBODY FUSION, L3-S1 INSTRUMENTATION;  Surgeon: Meade Maw, MD;  Location: [facility] OR[facility];  Service: Neurosurgery;  Laterality: N/A;   ANTERIOR CERVICAL DECOMP/DISCECTOMY FUSION N/A 07/09/2021   Procedure: C4-5 ANTERIOR CERVICAL DECOMPRESSION/DISCECTOMY FUSION 1 LEVEL;  Surgeon: Meade Maw, MD;  Location: [facility] ORS;  Service:  Neurosurgery;  Laterality: N/A;  schedule as first case   APPENDECTOMY  1974   BACK SURGERY     BREAST BIOPSY Right 02/15/2021   Q clip, [place] bx, 9:30 5cmfn Usual Ductal Hyperplasia, PASH   BREAST BIOPSY Left 02/15/2021   x clip, [place] bx, 1:00 2cmfn, Usual Ductal Hyperplasia, Apocrine Metaplasia, PASH   BREAST EXCISIONAL BIOPSY Right 1985   Benign cycts removed   CESAREAN SECTION  1993   COLONOSCOPY     LUMBAR FUSION  2010   Dr. March Rummage   LUMBAR FUSION  2013   x 2 Dr. Ellender Hose   TONSILLECTOMY     TUBAL LIGATION  1974   UPPER GI ENDOSCOPY     Patient Active Problem List   Diagnosis Date Noted   Spondylolisthesis 09/30/2020   Chronic left-sided low back pain with left-sided sciatica 04/19/2020   Osteoporosis, post-menopausal 08/13/2019   Vitamin D insufficiency 08/13/2019   Hyperlipidemia 08/02/2019   Degenerative joint disease 08/02/2019   GERD (gastroesophageal reflux disease) 08/02/2019   Hypothyroid 08/02/2019   Osteoporosis 08/02/2019   Sleep apnea 08/02/2019   Tobacco use disorder 07/02/2019   Carotid stenosis 07/02/2019   Lymphadenitis, chronic 06/14/2019   Atherosclerosis of arteries 05/04/2018   Pulmonary emphysema ([place]) 05/04/2018   Hx of adenomatous colonic polyps 05/02/2018   Skin cancer 06/17/2016  Post laminectomy syndrome 04/24/2012   Essential (primary) hypertension Mar 08, 1951    PCP: Idelle Crouch, MD  REFERRING PROVIDER: Meade Maw, MD  REFERRING DIAGNOSIS: Cervicalgia, s/p arthrodesis  THERAPY DIAG: Cervicalgia  RATIONALE FOR EVALUATION AND TREATMENT: Rehabilitation  ONSET DATE: 07/09/21  FOLLOW UP APPT WITH PROVIDER: No, pt advised to call if needed   FROM INITIAL EVALUATION  SUBJECTIVE:                                                                                                                                                                                         Chief Complaint: Imbalance  Pertinent History Pt referred for  imbalance. She states that the balance issues started after her L5-S1 ALIF surgery in 2021. She has had multiple lumbar fusions however the balance issues never appeared until the most recent lumbar surgery. She also recently underwent C4-C5 ACDF on 07/09/2021. She reports persistent UE/LE weakness since that time. She never had PT after her neck surgery however reports that she doesn't have issues at this time related to her neck. Pt reports worsening stress urinary incontinence as well.  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, fishing [employer] (bowel/bladder changes, saddle paresthesia, personal history of cancer, h/o spinal tumors, h/o compression fx, chills/fever, night sweats, nausea, vomiting): Negative  Precautions: None  Weight Bearing Restrictions: No, no lifting restrictions reported by patient or identified in the medical record  Fel[place] with: lives with their spouse Lives in: House/apartment, two levels, bedroom and bathroom on main floor, walk-in shower with built in seat and grab bars, 5 stairs from garage into the house with L railing during ascend Has following equipment at home: Single point cane, Walker - 2 wheeled, and Grab bars  Patient Goals: Improve balance and balance confidence    OBJECTIVE:   Patient Surveys  FOTO: 55, predicted improvement to 20 ABC: 42.5%  Cognition Patient is oriented to person,  place, and time.  Recent memory is intact.  Remote memory is intact.  Attention span and concentration are intact.  Expressive speech is intact.  Patient's fund of knowledge is within normal limits for educational level.    Johney Maine  Musculoskeletal Assessment Tremor: None Bulk: Normal Tone: Normal  GAIT: Full gait assessment deferred however self-selected gait speed is decreased  Posture: Full posture assessment deferred. However pt with forward head and rounded shoulders as well as flattened low back (loss of natural lordosis);   AROM: Deferred  LE MMT:  MMT (out of 5) Right 06/21/2022 Left 06/21/2022  Hip flexion 5 5  Hip extension    Hip abduction (seated) 4+ 4+  Hip adduction (seated) 4+ 4+  Hip internal rotation    Hip external rotation    Knee flexion 4+ 4+  Knee extension 5 5  Ankle dorsiflexion 4+ 4+  Ankle plantarflexion    Ankle inversion    Ankle eversion    (* = pain; Blank rows = not tested)  Sensation Deferred  Reflexes Deferred  Cranial Nerves Deferred  Coordination/Cerebellar Deferred   FUNCTIONAL OUTCOME MEASURES   Results Comments  BERG 51/56 Fall risk, in need of intervention  DGI    FGA    TUG 12.4s WNL  5TSTS 11.5s WNL  6 Minute Walk Test    10 Meter Gait Speed    (Blank rows = not tested)   TODAY'S TREATMENT   SUBJECTIVE: Pt reports that she is doing well today. She denies any resting pain upon arrival with the exception of continued pain in her left great toe. She has an appointment with podiatry on Thursday.  No specific questions or concerns currently.   PAIN: L great toe, not rated on NPRS;   Ther-ex  NuStep L2-4 x 5 minutes for warm-up (5 minutes unbilled); Sit to stand from regular height chair with Airex under feet 2 x 10; Forward 6" step-ups without UE support x 10 with each leg; Airex forward 6" step-ups without UE support with Airex pad on top of step x 10 with each leg;   Neuromuscular Re-education  All  balance exercises performed without UE support unless otherwise specified: Tandem balance alternating forward LE x 30s with each foot forward; Tandem gait in // bars x multiple laps; Alternating 6" cone taps x 10 BLE, multiple episodes of knocking over cone, attempted first with cone on top of step but too challenging for patient; Airex feet together ball tosses with second assist varying distance and direction x 60s; BOSU (round side up) forward lunges x 10 BLE;   Not performed: Seated clams with green tband x 10; Seated adductor ball squeeze x 10; Cross-over side stepping in // bars x multiple laps; Obstacle course with 6" hurdles in // bars with forward gait as well as lateral stepping x multiple bouts each direction; Incline standing balance with eyes open/closed x 30s each; Tandem balance alternating forward LE x 30s each; Lateral 6" step-ups without UE support x 10 toward each side; Seated LAQ with 5# AW x 10 BLE; Standing exercises with 5# ankle weights (AW): Hip flexion marches x 10 BLE; Hip abduction x 10 BLE; Hamstring curls x 10 BLE; Hip extension x 10 BLE;   PATIENT EDUCATION:  Education details: Pt educated throughout session about proper posture and technique with exercises. Improved exercise technique, movement at target joints, use of target muscles after min to mod verbal, visual, tactile cues.  Person educated: Patient Education method: Explanation Education comprehension: verbalized understanding and returned demonstration   HOME EXERCISE PROGRAM: Access Code: [email or web address] URL: [email or web address] Date: 06/09/2022 Prepared by: Roxana Hires  Exercises - Seated Hip Abduction with Resistance  - 1 x daily - 7 x weekly - 2 sets - 10  reps - 3s hold - Seated Hip Adduction Isometrics with Ball  - 1 x daily - 7 x weekly - 2 sets - 10 reps - 3s hold - Seated March with Resistance  - 1 x daily - 7 x weekly - 2 sets - 10 reps - 3s hold - Standing Tandem  Balance with Counter Support  - 1 x daily - 7 x weekly - 2 x 30s with each leg forward hold - Single Leg Stance with Support  - 1 x daily - 7 x weekly - 2 x 30s with each leg forward hold   ASSESSMENT:  CLINICAL IMPRESSION: Pt demonstrates excellent motivation during session today. Progressed strength and balance exercises with patient during session. She denies any increase in pain. Challenged pt on unstable Airex pad. No HEP modifications on this date. Pt advised to continue HEP and follow-up as scheduled. She will benefit from skilled PT to address weakness and imbalance and improve overall function.  REHAB POTENTIAL: Fair    CLINICAL DECISION MAKING: Unstable/unpredictable  EVALUATION COMPLEXITY: High   GOALS:  SHORT TERM GOALS: Target date: 07/19/2022  Pt will be independent with HEP in order to improve strength and balance in order to decrease fall risk and improve function at home. Baseline:  Goal status: INITIAL   LONG TERM GOALS: Target date: 08/16/2022  Pt will increase FOTO to at least 61 to demonstrate significant improvement in function at home related to balance  Baseline: 05/25/22: 55 Goal status: INITIAL  2.  Pt will improve BERG by at least 3 points in order to demonstrate clinically significant improvement in balance.   Baseline: 05/25/21: 51/56 Goal status: INITIAL  3.  Pt will improve ABC by at least 13% in order to demonstrate clinically significant improvement in balance confidence.      Baseline: 05/25/22: 42.5% Goal status: INITIAL  4.  Pt will increase 2MWT by at least 40' in order to demonstrate clinically significant improvement in cardiopulmonary endurance, balance, and community ambulation   Baseline: 05/25/22: To be completed; 06/09/22: 322' without assistive device  Goal status: INITIAL   PLAN: PT FREQUENCY: 2x/week  PT DURATION: 8 weeks  PLANNED INTERVENTIONS: Therapeutic exercises, Therapeutic activity, Neuromuscular re-education, Balance  training, Gait training, Patient/Family education, Joint manipulation, Joint mobilization, Canalith repositioning, Aquatic Therapy, Dry Needling, Cognitive remediation, Electrical stimulation, Spinal manipulation, Spinal mobilization, Cryotherapy, Moist heat, Traction, Ultrasound, Ionotophoresis '4mg'$ /ml Dexamethasone, and Manual therapy  PLAN FOR NEXT SESSION: review/modify HEP as needed, progress balance and strength exercises   Lyndel Safe Ethylene Reznick PT, DPT, GCS  Jillayne Witte 06/21/2022, 3:02 PM

## 2022-06-21 ENCOUNTER — Ambulatory Visit: Payer: Medicare Other

## 2022-06-21 DIAGNOSIS — M542 Cervicalgia: Secondary | ICD-10-CM

## 2022-06-23 ENCOUNTER — Ambulatory Visit (INDEPENDENT_AMBULATORY_CARE_PROVIDER_SITE_OTHER): Payer: Medicare Other | Admitting: Podiatry

## 2022-06-23 DIAGNOSIS — L600 Ingrowing nail: Secondary | ICD-10-CM | POA: Diagnosis not present

## 2022-06-23 MED ORDER — NEOMYCIN-POLYMYXIN-HC 1 % OT SOLN: OTIC | 1 refills | Status: AC

## 2022-06-23 NOTE — Patient Instructions (Signed)

## 2022-06-23 NOTE — Progress Notes (Signed)
Subjective:  Patient ID: Carol Carey, female    DOB: 1951-10-16,  MRN: [id number] HPI Chief Complaint  Patient presents with   Ingrown Toenail    L great toe ingrown nail    71 y.o. female presents with the above complaint.   ROS: Denies fever chills nausea vomiting muscle aches pains calf pain back pain chest pain shortness of breath.  Past Medical History:  Diagnosis Date   Aortic atherosclerosis ([place])    Arthritis    Ascending aorta dilatation ([facility])    a.) measured 3.9cm by TTE on 06/29/21   CAD (coronary artery disease)    Carotid artery stenosis    Colon polyp    COPD (chronic obstructive pulmonary disease) ([facility])    Degenerative joint disease    GERD (gastroesophageal reflux disease)    Hepatic steatosis    Hyperlipidemia    Hypertension    Hypothyroidism    Osteoporosis    Pulmonary emphysema ([facility])    Seasonal allergies    Skin cancer    lesion removed from nose   Sleep apnea    not on nocturnal PAP therapy   Spinal stenosis    Valvular insufficiency    a.) TTE on 06/29/2021 --> moderate AR, trivial MR/TR, mild PR   Past Surgical History:  Procedure Laterality Date   ABDOMINAL EXPOSURE N/A 09/30/2020   Procedure: ABDOMINAL EXPOSURE;  Surgeon: Algernon Huxley, MD;  Location: [facility];  Service: Vascular;  Laterality: N/A;   [place] N/A 09/30/2020   Procedure: L5-S1 ANTERIOR LUMBAR INTERBODY FUSION, L3-S1 INSTRUMENTATION;  Surgeon: Meade Maw, MD;  Location: [facility];  Service: Neurosurgery;  Laterality: N/A;   ANTERIOR CERVICAL DECOMP/DISCECTOMY FUSION N/A 07/09/2021   Procedure: C4-5 ANTERIOR CERVICAL DECOMPRESSION/DISCECTOMY FUSION 1 LEVEL;  Surgeon: Meade Maw, MD;  Location: [facility];  Service: Neurosurgery;  Laterality: N/A;  schedule as first case   APPENDECTOMY  1974   BACK SURGERY     BREAST BIOPSY Right 02/15/2021   Q clip, [place] bx, 9:30 5cmfn Usual Ductal Hyperplasia, PASH    BREAST BIOPSY Left 02/15/2021   x clip, [place] bx, 1:00 2cmfn, Usual Ductal Hyperplasia, Apocrine Metaplasia, PASH   BREAST EXCISIONAL BIOPSY Right 1985   Benign cycts removed   CESAREAN SECTION  1993   COLONOSCOPY     LUMBAR FUSION  2010   Dr. March Rummage   LUMBAR FUSION  2013   x 2 Dr. Ellender Hose   TONSILLECTOMY     TUBAL LIGATION  1974   UPPER GI ENDOSCOPY      Current Outpatient Medications:    NEOMYCIN-POLYMYXIN-HYDROCORTISONE (CORTISPORIN) 1 % SOLN OTIC solution, Apply 1-2 drops to toe BID after soaking, Disp: 10 mL, Rfl: 1   acetaminophen (TYLENOL) 500 MG tablet, Take 2 tablets (1,000 mg total) by mouth every 6 (six) hours. (Patient taking differently: Take 1,000 mg by mouth every 6 (six) hours as needed for moderate pain.), Disp: 30 tablet, Rfl: 0   alendronate (FOSAMAX) 70 MG tablet, Take 70 mg by mouth every Friday. Take with a full glass of water on an empty stomach., Disp: , Rfl:    atenolol (TENORMIN) 50 MG tablet, Take 50 mg by mouth at bedtime. , Disp: , Rfl:    atorvastatin (LIPITOR) 20 MG tablet, Take 20 mg by mouth daily. , Disp: , Rfl:    buPROPion (WELLBUTRIN XL) 150 MG 24 hr tablet, Take 150 mg by mouth daily., Disp: , Rfl:  CALCIUM-VITAMIN D PO, Take 1 tablet by mouth daily., Disp: , Rfl:    cyclobenzaprine (FLEXERIL) 10 MG tablet, Take 10 mg by mouth 3 (three) times daily as needed for muscle spasms., Disp: , Rfl:    docusate sodium (COLACE) 100 MG capsule, Take 1 capsule (100 mg total) by mouth 2 (two) times daily. (Patient taking differently: Take 100 mg by mouth daily as needed for moderate constipation.), Disp: 10 capsule, Rfl: 0   esomeprazole (NEXIUM) 20 MG capsule, Take 20 mg by mouth at bedtime., Disp: , Rfl:    fluticasone (FLONASE) 50 MCG/ACT nasal spray, Place 1 spray into the nose daily as needed for allergies., Disp: , Rfl:    hydrochlorothiazide (HYDRODIURIL) 25 MG tablet, Take 25 mg by mouth daily., Disp: , Rfl:    hydrocortisone 2.5 % cream, Apply 1  application topically 2 (two) times daily as needed (hemorrhoids)., Disp: , Rfl:    isosorbide mononitrate (IMDUR) 30 MG 24 hr tablet, Take 30 mg by mouth daily., Disp: , Rfl:    levothyroxine (SYNTHROID) 100 MCG tablet, Take 100 mcg by mouth at bedtime., Disp: , Rfl:    losartan (COZAAR) 100 MG tablet, Take 100 mg by mouth daily. , Disp: , Rfl:    montelukast (SINGULAIR) 10 MG tablet, Take 10 mg by mouth at bedtime.  (Patient not taking: Reported on 07/01/2021), Disp: , Rfl:    Potassium 99 MG TABS, Take 99 mg by mouth daily., Disp: , Rfl:    pregabalin (LYRICA) 75 MG capsule, Take 75 mg by mouth daily., Disp: , Rfl:    pregabalin (LYRICA) 75 MG capsule, Take 1 capsule (75 mg total) by mouth 2 (two) times daily., Disp: 90 capsule, Rfl: 2   senna (SENOKOT) 8.6 MG TABS tablet, Take 1 tablet (8.6 mg total) by mouth at bedtime as needed for mild constipation. (Patient not taking: Reported on 02/15/2022), Disp: 30 tablet, Rfl: 0  Allergies  Allergen Reactions   Demerol [Meperidine Hcl] Nausea And Vomiting    Vomiting lasted for 2 days after taking med   Latex Rash   Lisinopril Cough   Penicillins Rash   Sulfa Antibiotics Rash   Review of Systems Objective:  There were no vitals filed for this visit.  General: Well developed, nourished, in no acute distress, alert and oriented x3   Dermatological: Skin is warm, dry and supple bilateral. Nails x 10 are well maintained; remaining integument appears unremarkable at this time. There are no open sores, no preulcerative lesions, no rash or signs of infection present.  Shortening abraded nail margin on the treatment field border the hallux left quizzically tender on palpation.  No signs of infection.  Vascular: Dorsalis Pedis artery and Posterior Tibial artery pedal pulses are 2/4 bilateral with immedate capillary fill time. Pedal hair growth present. No varicosities and no lower extremity edema present bilateral.   Neruologic: Grossly intact via  light touch bilateral. Vibratory intact via tuning fork bilateral. Protective threshold with Semmes Wienstein monofilament intact to all pedal sites bilateral. Patellar and Achilles deep tendon reflexes 2+ bilateral. No Babinski or clonus noted bilateral.   Musculoskeletal: No gross boney pedal deformities bilateral. No pain, crepitus, or limitation noted with foot and ankle range of motion bilateral. Muscular strength 5/5 in all groups tested bilateral.  Gait: Unassisted, Nonantalgic.    Radiographs:  None taken  Assessment & Plan:   Assessment: Ingrown toenail fibular border hallux left  Plan: Chemical matricectomy was performed today after local anesthetic was administered tolerated  procedure well without complications.  She was given both oral and written home-going structure for care and soaking of the toe.  She was also given prescription for Cortisporin Otic to be applied twice daily after soaking.     Keena Heesch T. [place], [place]

## 2022-06-27 NOTE — Therapy (Signed)
OUTPATIENT PHYSICAL THERAPY BALANCE TREATMENT   Patient Name: Carol Carey MRN: [id number] DOB:03-09-1951, 71 y.o., female Today's Date: 06/28/2022   PT End of Session - 06/28/22 1304     Visit Number 6    Number of Visits 17    Date for PT Re-Evaluation 07/20/22    Authorization Type eval: 05/25/22    PT Start Time 1315    PT Stop Time 1400    PT Time Calculation (min) 45 min    Activity Tolerance Patient tolerated treatment well    Behavior During Therapy [facility] for tasks assessed/performed               Past Medical History:  Diagnosis Date   Aortic atherosclerosis ([place])    Arthritis    Ascending aorta dilatation ([place])    a.) measured 3.9cm by TTE on 06/29/21   CAD (coronary artery disease)    Carotid artery stenosis    Colon polyp    COPD (chronic obstructive pulmonary disease) ([place])    Degenerative joint disease    GERD (gastroesophageal reflux disease)    Hepatic steatosis    Hyperlipidemia    Hypertension    Hypothyroidism    Osteoporosis    Pulmonary emphysema ([facility])    Seasonal allergies    Skin cancer    lesion removed from nose   Sleep apnea    not on nocturnal PAP therapy   Spinal stenosis    Valvular insufficiency    a.) TTE on 06/29/2021 --> moderate AR, trivial MR/TR, mild PR   Past Surgical History:  Procedure Laterality Date   ABDOMINAL EXPOSURE N/A 09/30/2020   Procedure: ABDOMINAL EXPOSURE;  Surgeon: Algernon Huxley, MD;  Location: [facility] ORS;  Service: Vascular;  Laterality: N/A;   ABDOMINAL HYSTERECTOMY  1993   ANTERIOR AND POSTERIOR SPINAL FUSION N/A 09/30/2020   Procedure: L5-S1 ANTERIOR LUMBAR INTERBODY FUSION, L3-S1 INSTRUMENTATION;  Surgeon: Meade Maw, MD;  Location: [facility] OR[facility];  Service: Neurosurgery;  Laterality: N/A;   ANTERIOR CERVICAL DECOMP/DISCECTOMY FUSION N/A 07/09/2021   Procedure: C4-5 ANTERIOR CERVICAL DECOMPRESSION/DISCECTOMY FUSION 1 LEVEL;  Surgeon: Meade Maw, MD;  Location: [facility] OR[facility];  Service:  Neurosurgery;  Laterality: N/A;  schedule as first case   APPENDECTOMY  1974   BACK SURGERY     BREAST BIOPSY Right 02/15/2021   Q clip, [place] bx, 9:30 5cmfn Usual Ductal Hyperplasia, PASH   BREAST BIOPSY Left 02/15/2021   x clip, [place] bx, 1:00 2cmfn, Usual Ductal Hyperplasia, Apocrine Metaplasia, PASH   BREAST EXCISIONAL BIOPSY Right 1985   Benign cycts removed   CESAREAN SECTION  1993   COLONOSCOPY     LUMBAR FUSION  2010   Dr. March Rummage   LUMBAR FUSION  2013   x 2 Dr. Ellender Hose   TONSILLECTOMY     TUBAL LIGATION  1974   UPPER GI ENDOSCOPY     Patient Active Problem List   Diagnosis Date Noted   Spondylolisthesis 09/30/2020   Chronic left-sided low back pain with left-sided sciatica 04/19/2020   Osteoporosis, post-menopausal 08/13/2019   Vitamin D insufficiency 08/13/2019   Hyperlipidemia 08/02/2019   Degenerative joint disease 08/02/2019   GERD (gastroesophageal reflux disease) 08/02/2019   Hypothyroid 08/02/2019   Osteoporosis 08/02/2019   Sleep apnea 08/02/2019   Tobacco use disorder 07/02/2019   Carotid stenosis 07/02/2019   Lymphadenitis, chronic 06/14/2019   Atherosclerosis of arteries 05/04/2018   Pulmonary emphysema ([place]) 05/04/2018   Hx of adenomatous colonic polyps 05/02/2018   Skin cancer 06/17/2016  Post laminectomy syndrome 04/24/2012   Essential (primary) hypertension 1951-05-21    PCP: Idelle Crouch, MD  REFERRING PROVIDER: Meade Maw, MD  REFERRING DIAGNOSIS: Cervicalgia, s/p arthrodesis  THERAPY DIAG: Cervicalgia  RATIONALE FOR EVALUATION AND TREATMENT: Rehabilitation  ONSET DATE: 07/09/21  FOLLOW UP APPT WITH PROVIDER: No, pt advised to call if needed   FROM INITIAL EVALUATION  SUBJECTIVE:                                                                                                                                                                                         Chief Complaint: Imbalance  Pertinent History Pt referred for  imbalance. She states that the balance issues started after her L5-S1 ALIF surgery in 2021. She has had multiple lumbar fusions however the balance issues never appeared until the most recent lumbar surgery. She also recently underwent C4-C5 ACDF on 07/09/2021. She reports persistent UE/LE weakness since that time. She never had PT after her neck surgery however reports that she doesn't have issues at this time related to her neck. Pt reports worsening stress urinary incontinence as well.  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, fishing [employer] (bowel/bladder changes, saddle paresthesia, personal history of cancer, h/o spinal tumors, h/o compression fx, chills/fever, night sweats, nausea, vomiting): Negative  Precautions: None  Weight Bearing Restrictions: No, no lifting restrictions reported by patient or identified in the medical record  [place] with: lives with their spouse Lives in: House/apartment, two levels, bedroom and bathroom on main floor, walk-in shower with built in seat and grab bars, 5 stairs from garage into the house with L railing during ascend Has following equipment at home: Single point cane, Walker - 2 wheeled, and Grab bars  Patient Goals: Improve balance and balance confidence    OBJECTIVE:   Patient Surveys  FOTO: 55, predicted improvement to 35 ABC: 42.5%  Cognition Patient is oriented to person,  place, and time.  Recent memory is intact.  Remote memory is intact.  Attention span and concentration are intact.  Expressive speech is intact.  Patient's fund of knowledge is within normal limits for educational level.    Johney Maine  Musculoskeletal Assessment Tremor: None Bulk: Normal Tone: Normal  GAIT: Full gait assessment deferred however self-selected gait speed is decreased  Posture: Full posture assessment deferred. However pt with forward head and rounded shoulders as well as flattened low back (loss of natural lordosis);   AROM: Deferred  LE MMT:  MMT (out of 5) Right 06/28/2022 Left 06/28/2022  Hip flexion 5 5  Hip extension    Hip abduction (seated) 4+ 4+  Hip adduction (seated) 4+ 4+  Hip internal rotation    Hip external rotation    Knee flexion 4+ 4+  Knee extension 5 5  Ankle dorsiflexion 4+ 4+  Ankle plantarflexion    Ankle inversion    Ankle eversion    (* = pain; Blank rows = not tested)  Sensation Deferred  Reflexes Deferred  Cranial Nerves Deferred  Coordination/Cerebellar Deferred   FUNCTIONAL OUTCOME MEASURES   Results Comments  BERG 51/56 Fall risk, in need of intervention  DGI    FGA    TUG 12.4s WNL  5TSTS 11.5s WNL  6 Minute Walk Test    10 Meter Gait Speed    (Blank rows = not tested)   TODAY'S TREATMENT   SUBJECTIVE: Pt reports that she is doing well today. She denies any resting pain upon arrival with the exception of continued pain in her left great toe (only with pressure and not at rest). She aw the podiatrist who performed a chemical matricectomy to the toenail on the fibular border of the hallux. No specific questions or concerns currently.   PAIN: L great toe with pressure but no pain at rest   Ther-ex  NuStep L2-4 x 5 minutes for warm-up during interval history (3 minutes unbilled); Seated LAQ with 4# ankle weights (AW) 2 x 15 BLE; Seated clams with manual resistance from therapist 2 x 15 BLE; Seated adductor  squeezes with manual resistance from therapist 2 x 15 BLE;  Standing hip strengthening with 4# AW: Hip flexion marches 2 x 15 BLE; HS curls 2 x 15 BLE; Hip abduction 2 x 15 BLE; Hip extension 2 x 15 BLE;  Double black tband resisted gait forward, backward, R lateral, and L lateral x 5 each direction; Seated clams with green tband 2 x 15; Seated adductor ball squeeze 2 x 15;   Not performed: Cross-over side stepping in // bars x multiple laps; Obstacle course with 6" hurdles in // bars with forward gait as well as lateral stepping x multiple bouts each direction; Incline standing balance with eyes open/closed x 30s each; Tandem balance alternating forward LE x 30s each; Lateral 6" step-ups without UE support x 10 toward each side; Sit to stand from regular height chair without UE support 2 x 10; Squats without UE support x 10, cues for proper foot positioning and hip hinge; 6" alternating step ups without UE support x 10 leading with each LE; Forward BOSU lunges (round side up) without UE support alternating LE x 10 with each LE; Lateral BOSU lunges (round side up) without UE support x 10 to each side; Tandem gait in // bars x multiple laps; Alternating 6" cone taps x 10 BLE, multiple episodes of knocking over cone, attempted first with cone on top of step but too challenging for patient; Airex feet together ball tosses with second assist varying distance and direction x 60s;   PATIENT EDUCATION:  Education details: Pt educated throughout session about proper posture and technique with exercises. Improved exercise technique, movement at target joints, use  of target muscles after min to mod verbal, visual, tactile cues.  Person educated: Patient Education method: Explanation Education comprehension: verbalized understanding and returned demonstration   HOME EXERCISE PROGRAM: Access Code: [email or web address] URL: [email or web address] Date: 06/09/2022 Prepared by: Roxana Hires  Exercises - Seated Hip Abduction with Resistance  - 1 x daily - 7 x weekly - 2 sets - 10 reps - 3s hold - Seated Hip Adduction Isometrics with Ball  - 1 x daily - 7 x weekly - 2 sets - 10 reps - 3s hold - Seated March with Resistance  - 1 x daily - 7 x weekly - 2 sets - 10 reps - 3s hold - Standing Tandem Balance with Counter Support  - 1 x daily - 7 x weekly - 2 x 30s with each leg forward hold - Single Leg Stance with Support  - 1 x daily - 7 x weekly - 2 x 30s with each leg forward hold   ASSESSMENT:  CLINICAL IMPRESSION: Pt demonstrates excellent motivation during session today. Session focused on strengthening to avoid excessive pressure in L great toe in standing positions. Progressed resistance, sets, and reps to challenge pt and improve strength. No HEP modifications on this date. Pt advised to continue HEP and follow-up as scheduled. She will benefit from skilled PT to address weakness and imbalance and improve overall function.  REHAB POTENTIAL: Fair    CLINICAL DECISION MAKING: Unstable/unpredictable  EVALUATION COMPLEXITY: High   GOALS:  SHORT TERM GOALS: Target date: 07/26/2022  Pt will be independent with HEP in order to improve strength and balance in order to decrease fall risk and improve function at home. Baseline:  Goal status: INITIAL   LONG TERM GOALS: Target date: 08/23/2022  Pt will increase FOTO to at least 61 to demonstrate significant improvement in function at home related to balance  Baseline: 05/25/22: 55 Goal status: INITIAL  2.  Pt will improve BERG by at least 3 points in order to demonstrate clinically significant improvement in balance.   Baseline: 05/25/21: 51/56 Goal status: INITIAL  3.  Pt will improve ABC by at least 13% in order to demonstrate clinically significant improvement in balance confidence.      Baseline: 05/25/22: 42.5% Goal status: INITIAL  4.  Pt will increase 2MWT by at least 40' in order to demonstrate clinically  significant improvement in cardiopulmonary endurance, balance, and community ambulation   Baseline: 05/25/22: To be completed; 06/09/22: 322' without assistive device  Goal status: INITIAL   PLAN: PT FREQUENCY: 2x/week  PT DURATION: 8 weeks  PLANNED INTERVENTIONS: Therapeutic exercises, Therapeutic activity, Neuromuscular re-education, Balance training, Gait training, Patient/Family education, Joint manipulation, Joint mobilization, Canalith repositioning, Aquatic Therapy, Dry Needling, Cognitive remediation, Electrical stimulation, Spinal manipulation, Spinal mobilization, Cryotherapy, Moist heat, Traction, Ultrasound, Ionotophoresis '4mg'$ /ml Dexamethasone, and Manual therapy  PLAN FOR NEXT SESSION: review/modify HEP as needed, progress balance and strength exercises   Lyndel Safe Bow Buntyn PT, DPT, GCS  Darean Rote 06/28/2022, 5:22 PM

## 2022-06-28 ENCOUNTER — Ambulatory Visit: Payer: Medicare Other

## 2022-06-28 DIAGNOSIS — M542 Cervicalgia: Secondary | ICD-10-CM | POA: Diagnosis not present

## 2022-06-30 ENCOUNTER — Ambulatory Visit: Payer: Medicare Other

## 2022-06-30 DIAGNOSIS — M542 Cervicalgia: Secondary | ICD-10-CM | POA: Diagnosis not present

## 2022-06-30 NOTE — Therapy (Signed)
OUTPATIENT PHYSICAL THERAPY BALANCE TREATMENT   Patient Name: RONALEE SCHEUNEMANN MRN: [id number] DOB:May 03, 1951, 71 y.o., female Today's Date: 06/30/2022   PT End of Session - 06/30/22 1310     Visit Number 7    Number of Visits 17    Date for PT Re-Evaluation 07/20/22    Authorization Type eval: 05/25/22    PT Start Time 1315    PT Stop Time 1400    PT Time Calculation (min) 45 min    Activity Tolerance Patient tolerated treatment well    Behavior During Therapy [facility] for tasks assessed/performed                Past Medical History:  Diagnosis Date   Aortic atherosclerosis ([place])    Arthritis    Ascending aorta dilatation ([place])    a.) measured 3.9cm by TTE on 06/29/21   CAD (coronary artery disease)    Carotid artery stenosis    Colon polyp    COPD (chronic obstructive pulmonary disease) ([place])    Degenerative joint disease    GERD (gastroesophageal reflux disease)    Hepatic steatosis    Hyperlipidemia    Hypertension    Hypothyroidism    Osteoporosis    Pulmonary emphysema ([facility])    Seasonal allergies    Skin cancer    lesion removed from nose   Sleep apnea    not on nocturnal PAP therapy   Spinal stenosis    Valvular insufficiency    a.) TTE on 06/29/2021 --> moderate AR, trivial MR/TR, mild PR   Past Surgical History:  Procedure Laterality Date   ABDOMINAL EXPOSURE N/A 09/30/2020   Procedure: ABDOMINAL EXPOSURE;  Surgeon: Algernon Huxley, MD;  Location: [facility] ORS;  Service: Vascular;  Laterality: N/A;   ABDOMINAL HYSTERECTOMY  1993   ANTERIOR AND POSTERIOR SPINAL FUSION N/A 09/30/2020   Procedure: L5-S1 ANTERIOR LUMBAR INTERBODY FUSION, L3-S1 INSTRUMENTATION;  Surgeon: Meade Maw, MD;  Location: [facility] ORS;  Service: Neurosurgery;  Laterality: N/A;   ANTERIOR CERVICAL DECOMP/DISCECTOMY FUSION N/A 07/09/2021   Procedure: C4-5 ANTERIOR CERVICAL DECOMPRESSION/DISCECTOMY FUSION 1 LEVEL;  Surgeon: Meade Maw, MD;  Location: [facility] ORS;  Service:  Neurosurgery;  Laterality: N/A;  schedule as first case   APPENDECTOMY  1974   BACK SURGERY     BREAST BIOPSY Right 02/15/2021   Q clip, [place] bx, 9:30 5cmfn Usual Ductal Hyperplasia, PASH   BREAST BIOPSY Left 02/15/2021   x clip, [place] bx, 1:00 2cmfn, Usual Ductal Hyperplasia, Apocrine Metaplasia, PASH   BREAST EXCISIONAL BIOPSY Right 1985   Benign cycts removed   CESAREAN SECTION  1993   COLONOSCOPY     LUMBAR FUSION  2010   Dr. March Rummage   LUMBAR FUSION  2013   x 2 Dr. Ellender Hose   TONSILLECTOMY     TUBAL LIGATION  1974   UPPER GI ENDOSCOPY     Patient Active Problem List   Diagnosis Date Noted   Spondylolisthesis 09/30/2020   Chronic left-sided low back pain with left-sided sciatica 04/19/2020   Osteoporosis, post-menopausal 08/13/2019   Vitamin D insufficiency 08/13/2019   Hyperlipidemia 08/02/2019   Degenerative joint disease 08/02/2019   GERD (gastroesophageal reflux disease) 08/02/2019   Hypothyroid 08/02/2019   Osteoporosis 08/02/2019   Sleep apnea 08/02/2019   Tobacco use disorder 07/02/2019   Carotid stenosis 07/02/2019   Lymphadenitis, chronic 06/14/2019   Atherosclerosis of arteries 05/04/2018   Pulmonary emphysema ([place]) 05/04/2018   Hx of adenomatous colonic polyps 05/02/2018   Skin cancer  06/17/2016   Post laminectomy syndrome 04/24/2012   Essential (primary) hypertension 06/11/51    PCP: Idelle Crouch, MD  REFERRING PROVIDER: Meade Maw, MD  REFERRING DIAGNOSIS: Cervicalgia, s/p arthrodesis  THERAPY DIAG: Cervicalgia  RATIONALE FOR EVALUATION AND TREATMENT: Rehabilitation  ONSET DATE: 07/09/21  FOLLOW UP APPT WITH PROVIDER: No, pt advised to call if needed   FROM INITIAL EVALUATION  SUBJECTIVE:                                                                                                                                                                                         Chief Complaint: Imbalance  Pertinent History Pt referred for  imbalance. She states that the balance issues started after her L5-S1 ALIF surgery in 2021. She has had multiple lumbar fusions however the balance issues never appeared until the most recent lumbar surgery. She also recently underwent C4-C5 ACDF on 07/09/2021. She reports persistent UE/LE weakness since that time. She never had PT after her neck surgery however reports that she doesn't have issues at this time related to her neck. Pt reports worsening stress urinary incontinence as well.  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, fishing [employer] (bowel/bladder changes, saddle paresthesia, personal history of cancer, h/o spinal tumors, h/o compression fx, chills/fever, night sweats, nausea, vomiting): Negative  Precautions: None  Weight Bearing Restrictions: No, no lifting restrictions reported by patient or identified in the medical record  [place] with: lives with their spouse Lives in: House/apartment, two levels, bedroom and bathroom on main floor, walk-in shower with built in seat and grab bars, 5 stairs from garage into the house with L railing during ascend Has following equipment at home: Single point cane, Walker - 2 wheeled, and Grab bars  Patient Goals: Improve balance and balance confidence    OBJECTIVE:   Patient Surveys  FOTO: 55, predicted improvement to 7 ABC: 42.5%  Cognition Patient is oriented to person,  place, and time.  Recent memory is intact.  Remote memory is intact.  Attention span and concentration are intact.  Expressive speech is intact.  Patient's fund of knowledge is within normal limits for educational level.  Gross Musculoskeletal Assessment Tremor: None Bulk: Normal Tone: Normal  GAIT: Full gait assessment deferred however self-selected gait speed is decreased  Posture: Full posture assessment deferred. However pt with forward head and rounded shoulders as well as flattened low back (loss of natural lordosis);   AROM: Deferred  LE MMT:  MMT (out of 5) Right 06/30/2022 Left 06/30/2022  Hip flexion 5 5  Hip extension    Hip abduction (seated) 4+ 4+  Hip adduction (seated) 4+ 4+  Hip internal rotation    Hip external rotation    Knee flexion 4+ 4+  Knee extension 5 5  Ankle dorsiflexion 4+ 4+  Ankle plantarflexion    Ankle inversion    Ankle eversion    (* = pain; Blank rows = not tested)  Sensation Deferred  Reflexes Deferred  Cranial Nerves Deferred  Coordination/Cerebellar Deferred   FUNCTIONAL OUTCOME MEASURES   Results Comments  BERG 51/56 Fall risk, in need of intervention  DGI    FGA    TUG 12.4s WNL  5TSTS 11.5s WNL  6 Minute Walk Test    10 Meter Gait Speed    (Blank rows = not tested)   TODAY'S TREATMENT   SUBJECTIVE: Pt reports that she is doing well today. She denies any resting pain upon arrival with the exception of continued pain in her left great toe.  She has continued using the antibiotic drops and has continued soaking her toe as advised. No specific questions or concerns currently.   PAIN: L great toe   Ther-ex  NuStep L2-4 x 5 minutes for warm-up during interval history (3 minutes unbilled); Supine SLR with 3# ankle weights (AW) 2 x 10 BLE: Hooklying bridges 2 x 10; Hooklying clams with green tband 2 x 10; Hooklying adductor squeeze with manual resistance 2 x 10; Supine leg press against therapist's  bodyweight 2 x 10 BLE; Seated LAQ with 5# ankle weights (AW) 2 x 10 BLE; Seated hamstring curls with green tband 2 x 10 BLE;   Not performed: Cross-over side stepping in // bars x multiple laps; Obstacle course with 6" hurdles in // bars with forward gait as well as lateral stepping x multiple bouts each direction; Incline standing balance with eyes open/closed x 30s each; Tandem balance alternating forward LE x 30s each; Lateral 6" step-ups without UE support x 10 toward each side; Sit to stand from regular height chair without UE support 2 x 10; Squats without UE support x 10, cues for proper foot positioning and hip hinge; 6" alternating step ups without UE support x 10 leading with each LE; Forward BOSU lunges (round side up) without UE support alternating LE x 10 with each LE; Lateral BOSU lunges (round side up) without UE support x 10 to each side; Tandem gait in // bars x multiple laps; Alternating 6" cone taps x 10 BLE, multiple episodes of knocking over cone, attempted first with cone on top of step but too challenging for patient; Airex feet together ball tosses with second assist varying distance and direction x 60s; Standing hip strengthening with 4# AW: Hip flexion marches 2 x 15 BLE; HS curls 2 x 15 BLE; Hip abduction 2 x 15 BLE; Hip extension 2 x 15 BLE; Double black tband resisted gait forward, backward, R lateral, and L lateral x 5 each direction; Seated clams with green tband 2 x 15; Seated adductor ball squeeze 2 x 15;   PATIENT EDUCATION:  Education details: Pt educated throughout session about proper posture  and technique with exercises. Improved exercise technique, movement at target joints, use of target muscles after min to mod verbal, visual, tactile cues.  Person educated: Patient Education method: Explanation Education comprehension: verbalized understanding and returned demonstration   HOME EXERCISE PROGRAM: Access Code: [email or web address] URL:  [email or web address] Date: 06/09/2022 Prepared by: Roxana Hires  Exercises - Seated Hip Abduction with Resistance  - 1 x daily - 7 x weekly - 2 sets - 10 reps - 3s hold - Seated Hip Adduction Isometrics with Ball  - 1 x daily - 7 x weekly - 2 sets - 10 reps - 3s hold - Seated March with Resistance  - 1 x daily - 7 x weekly - 2 sets - 10 reps - 3s hold - Standing Tandem Balance with Counter Support  - 1 x daily - 7 x weekly - 2 x 30s with each leg forward hold - Single Leg Stance with Support  - 1 x daily - 7 x weekly - 2 x 30s with each leg forward hold   ASSESSMENT:  CLINICAL IMPRESSION: Pt demonstrates excellent motivation during session today. Session focused on strengthening to avoid excessive pressure in L great toe in standing positions. Pt has some redness around the right great toe but no discharge, swelling, warmth, or tenderness. Denies chills or fevers. Pt encouraged to monitor, continue care as instructed by podiatry, and contact podiatry if it worsens or she has concern for infection. No HEP modifications on this date. Pt advised to continue HEP and follow-up as scheduled. She will benefit from skilled PT to address weakness and imbalance and improve overall function.  REHAB POTENTIAL: Fair    CLINICAL DECISION MAKING: Unstable/unpredictable  EVALUATION COMPLEXITY: High   GOALS:  SHORT TERM GOALS: Target date: 07/28/2022  Pt will be independent with HEP in order to improve strength and balance in order to decrease fall risk and improve function at home. Baseline:  Goal status: INITIAL   LONG TERM GOALS: Target date: 08/25/2022  Pt will increase FOTO to at least 61 to demonstrate significant improvement in function at home related to balance  Baseline: 05/25/22: 55 Goal status: INITIAL  2.  Pt will improve BERG by at least 3 points in order to demonstrate clinically significant improvement in balance.   Baseline: 05/25/21: 51/56 Goal status:  INITIAL  3.  Pt will improve ABC by at least 13% in order to demonstrate clinically significant improvement in balance confidence.      Baseline: 05/25/22: 42.5% Goal status: INITIAL  4.  Pt will increase 2MWT by at least 40' in order to demonstrate clinically significant improvement in cardiopulmonary endurance, balance, and community ambulation   Baseline: 05/25/22: To be completed; 06/09/22: 322' without assistive device  Goal status: INITIAL   PLAN: PT FREQUENCY: 2x/week  PT DURATION: 8 weeks  PLANNED INTERVENTIONS: Therapeutic exercises, Therapeutic activity, Neuromuscular re-education, Balance training, Gait training, Patient/Family education, Joint manipulation, Joint mobilization, Canalith repositioning, Aquatic Therapy, Dry Needling, Cognitive remediation, Electrical stimulation, Spinal manipulation, Spinal mobilization, Cryotherapy, Moist heat, Traction, Ultrasound, Ionotophoresis '4mg'$ /ml Dexamethasone, and Manual therapy  PLAN FOR NEXT SESSION: review/modify HEP as needed, progress balance and strength exercises   Lyndel Safe Isadore Bokhari PT, DPT, GCS  Aliz Meritt 06/30/2022, 3:12 PM

## 2022-07-04 NOTE — Therapy (Signed)
OUTPATIENT PHYSICAL THERAPY BALANCE TREATMENT/GOAL UPDATE   Patient Name: Carol Carey MRN: [id number] DOB:05-Oct-1951, 71 y.o., female Today's Date: 07/05/2022   PT End of Session - 07/05/22 1306     Visit Number 8    Number of Visits 17    Date for PT Re-Evaluation 07/20/22    Authorization Type eval: 05/25/22    PT Start Time 1315    PT Stop Time 1400    PT Time Calculation (min) 45 min    Activity Tolerance Patient tolerated treatment well    Behavior During Therapy [facility] for tasks assessed/performed              Past Medical History:  Diagnosis Date   Aortic atherosclerosis ([place])    Arthritis    Ascending aorta dilatation ([place])    a.) measured 3.9cm by TTE on 06/29/21   CAD (coronary artery disease)    Carotid artery stenosis    Colon polyp    COPD (chronic obstructive pulmonary disease) ([place])    Degenerative joint disease    GERD (gastroesophageal reflux disease)    Hepatic steatosis    Hyperlipidemia    Hypertension    Hypothyroidism    Osteoporosis    Pulmonary emphysema ([facility])    Seasonal allergies    Skin cancer    lesion removed from nose   Sleep apnea    not on nocturnal PAP therapy   Spinal stenosis    Valvular insufficiency    a.) TTE on 06/29/2021 --> moderate AR, trivial MR/TR, mild PR   Past Surgical History:  Procedure Laterality Date   ABDOMINAL EXPOSURE N/A 09/30/2020   Procedure: ABDOMINAL EXPOSURE;  Surgeon: Algernon Huxley, MD;  Location: [facility] ORS;  Service: Vascular;  Laterality: N/A;   ABDOMINAL HYSTERECTOMY  1993   ANTERIOR AND POSTERIOR SPINAL FUSION N/A 09/30/2020   Procedure: L5-S1 ANTERIOR LUMBAR INTERBODY FUSION, L3-S1 INSTRUMENTATION;  Surgeon: Meade Maw, MD;  Location: [facility] ORS;  Service: Neurosurgery;  Laterality: N/A;   ANTERIOR CERVICAL DECOMP/DISCECTOMY FUSION N/A 07/09/2021   Procedure: C4-5 ANTERIOR CERVICAL DECOMPRESSION/DISCECTOMY FUSION 1 LEVEL;  Surgeon: Meade Maw, MD;  Location: [facility] ORS;  Service:  Neurosurgery;  Laterality: N/A;  schedule as first case   APPENDECTOMY  1974   BACK SURGERY     BREAST BIOPSY Right 02/15/2021   Q clip, [place] bx, 9:30 5cmfn Usual Ductal Hyperplasia, PASH   BREAST BIOPSY Left 02/15/2021   x clip, [place] bx, 1:00 2cmfn, Usual Ductal Hyperplasia, Apocrine Metaplasia, PASH   BREAST EXCISIONAL BIOPSY Right 1985   Benign cycts removed   CESAREAN SECTION  1993   COLONOSCOPY     LUMBAR FUSION  2010   Dr. March Rummage   LUMBAR FUSION  2013   x 2 Dr. Ellender Hose   TONSILLECTOMY     TUBAL LIGATION  1974   UPPER GI ENDOSCOPY     Patient Active Problem List   Diagnosis Date Noted   Spondylolisthesis 09/30/2020   Chronic left-sided low back pain with left-sided sciatica 04/19/2020   Osteoporosis, post-menopausal 08/13/2019   Vitamin D insufficiency 08/13/2019   Hyperlipidemia 08/02/2019   Degenerative joint disease 08/02/2019   GERD (gastroesophageal reflux disease) 08/02/2019   Hypothyroid 08/02/2019   Osteoporosis 08/02/2019   Sleep apnea 08/02/2019   Tobacco use disorder 07/02/2019   Carotid stenosis 07/02/2019   Lymphadenitis, chronic 06/14/2019   Atherosclerosis of arteries 05/04/2018   Pulmonary emphysema ([place]) 05/04/2018   Hx of adenomatous colonic polyps 05/02/2018   Skin cancer 06/17/2016  Post laminectomy syndrome 04/24/2012   Essential (primary) hypertension 1951/03/11    PCP: Idelle Crouch, MD  REFERRING PROVIDER: Meade Maw, MD  REFERRING DIAGNOSIS: Cervicalgia, s/p arthrodesis  THERAPY DIAG: Muscle weakness (generalized)  Unsteadiness on feet  RATIONALE FOR EVALUATION AND TREATMENT: Rehabilitation  ONSET DATE: 07/09/21  FOLLOW UP APPT WITH PROVIDER: No, pt advised to call if needed   FROM INITIAL EVALUATION  SUBJECTIVE:                                                                                                                                                                                         Chief Complaint:  Imbalance  Pertinent History Pt referred for imbalance. She states that the balance issues started after her L5-S1 ALIF surgery in 2021. She has had multiple lumbar fusions however the balance issues never appeared until the most recent lumbar surgery. She also recently underwent C4-C5 ACDF on 07/09/2021. She reports persistent UE/LE weakness since that time. She never had PT after her neck surgery however reports that she doesn't have issues at this time related to her neck. Pt reports worsening stress urinary incontinence as well.  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, fishing [employer] (bowel/bladder changes, saddle paresthesia, personal history of cancer, h/o spinal tumors, h/o compression fx, chills/fever, night sweats, nausea, vomiting): Negative  Precautions: None  Weight Bearing Restrictions: No, no lifting restrictions reported by patient or identified in the medical record  Melvina with: lives with their spouse Lives in: House/apartment, two levels, bedroom and bathroom on main floor, walk-in shower with built in seat and grab bars, 5 stairs from garage into the house with L railing during ascend Has following equipment at home: Single point cane, Walker - 2 wheeled, and Grab bars  Patient Goals: Improve balance and balance confidence    OBJECTIVE:   Patient Surveys  FOTO: 55, predicted improvement to 42 ABC:  42.5%  Cognition Patient is oriented to person, place, and time.  Recent memory is intact.  Remote memory is intact.  Attention span and concentration are intact.  Expressive speech is intact.  Patient's fund of knowledge is within normal limits for  educational level.    Gross Musculoskeletal Assessment Tremor: None Bulk: Normal Tone: Normal  GAIT: Full gait assessment deferred however self-selected gait speed is decreased  Posture: Full posture assessment deferred. However pt with forward head and rounded shoulders as well as flattened low back (loss of natural lordosis);   AROM: Deferred  LE MMT:  MMT (out of 5) Right 07/05/2022 Left 07/05/2022  Hip flexion 5 5  Hip extension    Hip abduction (seated) 4+ 4+  Hip adduction (seated) 4+ 4+  Hip internal rotation    Hip external rotation    Knee flexion 4+ 4+  Knee extension 5 5  Ankle dorsiflexion 4+ 4+  Ankle plantarflexion    Ankle inversion    Ankle eversion    (* = pain; Blank rows = not tested)  Sensation Deferred  Reflexes Deferred  Cranial Nerves Deferred  Coordination/Cerebellar Deferred   FUNCTIONAL OUTCOME MEASURES   Results Comments  BERG 51/56 Fall risk, in need of intervention  DGI    FGA    TUG 12.4s WNL  5TSTS 11.5s WNL  6 Minute Walk Test    10 Meter Gait Speed    (Blank rows = not tested)   TODAY'S TREATMENT   SUBJECTIVE: Pt reports that she is doing well today. She denies any resting pain upon arrival with the exception of continued pain in her left great toe. However, it has improved since her last therapy session and the redness is resolving.  No specific questions or concerns currently.   PAIN: Denies   Ther-ex  NuStep L2-3 x 5 minutes for warm-up during interval history (3 minutes unbilled); Standing hip strengthening with 5# AW: Hip flexion marches x 15 BLE; Hamstring curls x 15 BLE; Hip abduction x 15 BLE; Hip extension x 15 BLE; Seated LAQ with 5# ankle weights  (AW) 2 x 15 BLE;   Neuromuscular Re-education  Tandem balance alternating forward LE x 30s each; Tandem gait in // bars x multiple laps; Airex balance beam side stepping in // bars x multiple laps; Updated outcome measures with patient: ABC: 45% BERG: 53/56;   Not performed: Cross-over side stepping in // bars x multiple laps; Obstacle course with 6" hurdles in // bars with forward gait as well as lateral stepping x multiple bouts each direction; Incline standing balance with eyes open/closed x 30s each; Lateral 6" step-ups without UE support x 10 toward each side; Sit to stand from regular height chair without UE support 2 x 10; Squats without UE support x 10, cues for proper foot positioning and hip hinge; 6" alternating step ups without UE support x 10 leading with each LE; Forward BOSU lunges (round side up) without UE support alternating LE x 10 with each LE; Lateral BOSU lunges (round side up) without UE support x 10 to each side; Alternating 6" cone taps x 10 BLE, multiple episodes of knocking over cone, attempted first with cone on top of step but too challenging for patient; Airex feet together ball tosses with second assist varying distance and direction x 60s; Double black tband resisted gait forward, backward, R lateral, and L lateral x 5 each direction; Seated clams with green tband 2 x 15; Seated adductor ball squeeze 2 x 15; Supine SLR with 3# ankle weights (AW) 2 x 10 BLE: Hooklying bridges 2 x 10; Hooklying clams with green tband 2 x 10; Hooklying adductor squeeze with manual resistance 2 x 10; Supine leg press against therapist's bodyweight 2 x 10 BLE; Seated  hamstring curls with green tband 2 x 10 BLE;   PATIENT EDUCATION:  Education details: Pt educated throughout session about proper posture and technique with exercises. Improved exercise technique, movement at target joints, use of target muscles after min to mod verbal, visual, tactile cues, outcome  measures, goals, and plan of care;  Person educated: Patient Education method: Explanation Education comprehension: verbalized understanding and returned demonstration   HOME EXERCISE PROGRAM: Access Code: [email or web address] URL: [email or web address] Date: 06/09/2022 Prepared by: Roxana Hires  Exercises - Seated Hip Abduction with Resistance  - 1 x daily - 7 x weekly - 2 sets - 10 reps - 3s hold - Seated Hip Adduction Isometrics with Ball  - 1 x daily - 7 x weekly - 2 sets - 10 reps - 3s hold - Seated March with Resistance  - 1 x daily - 7 x weekly - 2 sets - 10 reps - 3s hold - Standing Tandem Balance with Counter Support  - 1 x daily - 7 x weekly - 2 x 30s with each leg forward hold - Single Leg Stance with Support  - 1 x daily - 7 x weekly - 2 x 30s with each leg forward hold   ASSESSMENT:  CLINICAL IMPRESSION: Pt demonstrates excellent motivation during session today. Updated outcome measures and goals with patient during session today. No significant change in her FOTO score and her ABC is only slightly higher. Her BERG improved from 51/56 to 53/56 indicating slight improvement in balance. Subjectively pt reporting slight improvement in strength and balance since starting with therapy. Deferred Two Minute Walk Test today due to increase in L great toe pain with excessive walking. Will consider performing at next session. Additional time during session focused on both balance and strengthening. No HEP modifications on this date. Will consider decreasing frequency of therapy to 1x/wk after next therapy session. Pt advised to continue HEP and follow-up as scheduled. She will benefit from skilled PT to address weakness and imbalance and improve overall function.  REHAB POTENTIAL: Fair    CLINICAL DECISION MAKING: Unstable/unpredictable  EVALUATION COMPLEXITY: High   GOALS:  SHORT TERM GOALS: Target date: 08/02/2022  Pt will be independent with HEP in order to improve  strength and balance in order to decrease fall risk and improve function at home. Baseline:  Goal status: INITIAL   LONG TERM GOALS: Target date: 08/30/2022  Pt will increase FOTO to at least 61 to demonstrate significant improvement in function at home related to balance  Baseline: 05/25/22: 55; 07/05/22: 54 Goal status: INITIAL  2.  Pt will improve BERG by at least 3 points in order to demonstrate clinically significant improvement in balance.   Baseline: 05/25/21: 51/56; 07/05/22: 53/56 Goal status: INITIAL  3.  Pt will improve ABC by at least 13% in order to demonstrate clinically significant improvement in balance confidence.      Baseline: 05/25/22: 42.5%; 07/05/22: 45% Goal status: INITIAL  4.  Pt will increase 2MWT by at least 40' in order to demonstrate clinically significant improvement in cardiopulmonary endurance, balance, and community ambulation   Baseline: 05/25/22: To be completed; 06/09/22: 322' without assistive device; 07/05/22: Deferred Goal status: INITIAL   PLAN: PT FREQUENCY: 2x/week  PT DURATION: 8 weeks  PLANNED INTERVENTIONS: Therapeutic exercises, Therapeutic activity, Neuromuscular re-education, Balance training, Gait training, Patient/Family education, Joint manipulation, Joint mobilization, Canalith repositioning, Aquatic Therapy, Dry Needling, Cognitive remediation, Electrical stimulation, Spinal manipulation, Spinal mobilization, Cryotherapy, Moist heat, Traction, Ultrasound, Ionotophoresis '4mg'$ /ml Dexamethasone, and Manual therapy  PLAN FOR NEXT SESSION: review/modify HEP as needed, progress balance and strength exercises   Lyndel Safe Leonard Hendler PT, DPT, GCS  Mallary Kreger 07/05/2022, 2:47 PM

## 2022-07-05 ENCOUNTER — Ambulatory Visit: Payer: Medicare Other | Attending: Neurosurgery

## 2022-07-05 DIAGNOSIS — M6281 Muscle weakness (generalized): Secondary | ICD-10-CM | POA: Insufficient documentation

## 2022-07-05 DIAGNOSIS — R2681 Unsteadiness on feet: Secondary | ICD-10-CM | POA: Diagnosis present

## 2022-07-07 ENCOUNTER — Ambulatory Visit: Payer: Medicare Other

## 2022-07-07 DIAGNOSIS — M6281 Muscle weakness (generalized): Secondary | ICD-10-CM

## 2022-07-07 DIAGNOSIS — R2681 Unsteadiness on feet: Secondary | ICD-10-CM

## 2022-07-07 NOTE — Therapy (Signed)
OUTPATIENT PHYSICAL THERAPY BALANCE TREATMENT   Patient Name: Carol Carey MRN: [id number] DOB:05-26-1951, 71 y.o., female Today's Date: 07/08/2022   PT End of Session - 07/07/22 1313     Visit Number 9    Number of Visits 17    Date for PT Re-Evaluation 07/20/22    Authorization Type eval: 05/25/22    PT Start Time 1315    PT Stop Time 1400    PT Time Calculation (min) 45 min    Activity Tolerance Patient tolerated treatment well    Behavior During Therapy [facility] for tasks assessed/performed             Past Medical History:  Diagnosis Date   Aortic atherosclerosis ([place])    Arthritis    Ascending aorta dilatation ([place])    a.) measured 3.9cm by TTE on 06/29/21   CAD (coronary artery disease)    Carotid artery stenosis    Colon polyp    COPD (chronic obstructive pulmonary disease) ([place])    Degenerative joint disease    GERD (gastroesophageal reflux disease)    Hepatic steatosis    Hyperlipidemia    Hypertension    Hypothyroidism    Osteoporosis    Pulmonary emphysema ([facility])    Seasonal allergies    Skin cancer    lesion removed from nose   Sleep apnea    not on nocturnal PAP therapy   Spinal stenosis    Valvular insufficiency    a.) TTE on 06/29/2021 --> moderate AR, trivial MR/TR, mild PR   Past Surgical History:  Procedure Laterality Date   ABDOMINAL EXPOSURE N/A 09/30/2020   Procedure: ABDOMINAL EXPOSURE;  Surgeon: Algernon Huxley, MD;  Location: [facility] ORS;  Service: Vascular;  Laterality: N/A;   ABDOMINAL HYSTERECTOMY  1993   ANTERIOR AND POSTERIOR SPINAL FUSION N/A 09/30/2020   Procedure: L5-S1 ANTERIOR LUMBAR INTERBODY FUSION, L3-S1 INSTRUMENTATION;  Surgeon: Meade Maw, MD;  Location: [facility] ORS;  Service: Neurosurgery;  Laterality: N/A;   ANTERIOR CERVICAL DECOMP/DISCECTOMY FUSION N/A 07/09/2021   Procedure: C4-5 ANTERIOR CERVICAL DECOMPRESSION/DISCECTOMY FUSION 1 LEVEL;  Surgeon: Meade Maw, MD;  Location: [facility] ORS;  Service: Neurosurgery;   Laterality: N/A;  schedule as first case   APPENDECTOMY  1974   BACK SURGERY     BREAST BIOPSY Right 02/15/2021   Q clip, [place] bx, 9:30 5cmfn Usual Ductal Hyperplasia, PASH   BREAST BIOPSY Left 02/15/2021   x clip, [place] bx, 1:00 2cmfn, Usual Ductal Hyperplasia, Apocrine Metaplasia, PASH   BREAST EXCISIONAL BIOPSY Right 1985   Benign cycts removed   CESAREAN SECTION  1993   COLONOSCOPY     LUMBAR FUSION  2010   Dr. March Rummage   LUMBAR FUSION  2013   x 2 Dr. Ellender Hose   TONSILLECTOMY     TUBAL LIGATION  1974   UPPER GI ENDOSCOPY     Patient Active Problem List   Diagnosis Date Noted   Spondylolisthesis 09/30/2020   Chronic left-sided low back pain with left-sided sciatica 04/19/2020   Osteoporosis, post-menopausal 08/13/2019   Vitamin D insufficiency 08/13/2019   Hyperlipidemia 08/02/2019   Degenerative joint disease 08/02/2019   GERD (gastroesophageal reflux disease) 08/02/2019   Hypothyroid 08/02/2019   Osteoporosis 08/02/2019   Sleep apnea 08/02/2019   Tobacco use disorder 07/02/2019   Carotid stenosis 07/02/2019   Lymphadenitis, chronic 06/14/2019   Atherosclerosis of arteries 05/04/2018   Pulmonary emphysema ([place]) 05/04/2018   Hx of adenomatous colonic polyps 05/02/2018   Skin cancer 06/17/2016  Post laminectomy syndrome 04/24/2012   Essential (primary) hypertension 05-13-1951    PCP: Idelle Crouch, MD  REFERRING PROVIDER: Meade Maw, MD  REFERRING DIAGNOSIS: Cervicalgia, s/p arthrodesis  THERAPY DIAG: Muscle weakness (generalized)  Unsteadiness on feet  RATIONALE FOR EVALUATION AND TREATMENT: Rehabilitation  ONSET DATE: 07/09/21  FOLLOW UP APPT WITH PROVIDER: No, pt advised to call if needed   FROM INITIAL EVALUATION  SUBJECTIVE:                                                                                                                                                                                         Chief Complaint: Imbalance  Pertinent  History Pt referred for imbalance. She states that the balance issues started after her L5-S1 ALIF surgery in 2021. She has had multiple lumbar fusions however the balance issues never appeared until the most recent lumbar surgery. She also recently underwent C4-C5 ACDF on 07/09/2021. She reports persistent UE/LE weakness since that time. She never had PT after her neck surgery however reports that she doesn't have issues at this time related to her neck. Pt reports worsening stress urinary incontinence as well.  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, fishing [employer] (bowel/bladder changes, saddle paresthesia, personal history of cancer, h/o spinal tumors, h/o compression fx, chills/fever, night sweats, nausea, vomiting): Negative  Precautions: None  Weight Bearing Restrictions: No, no lifting restrictions reported by patient or identified in the medical record  [place] with: lives with their spouse Lives in: House/apartment, two levels, bedroom and bathroom on main floor, walk-in shower with built in seat and grab bars, 5 stairs from garage into the house with L railing during ascend Has following equipment at home: Single point cane, Walker - 2 wheeled, and Grab bars  Patient Goals: Improve balance and balance confidence    OBJECTIVE:   Patient Surveys  FOTO: 55, predicted improvement to 53 ABC:  42.5%  Cognition Patient is oriented to person, place, and time.  Recent memory is intact.  Remote memory is intact.  Attention span and concentration are intact.  Expressive speech is intact.  Patient's fund of knowledge is within normal limits for  educational level.    Gross Musculoskeletal Assessment Tremor: None Bulk: Normal Tone: Normal  GAIT: Full gait assessment deferred however self-selected gait speed is decreased  Posture: Full posture assessment deferred. However pt with forward head and rounded shoulders as well as flattened low back (loss of natural lordosis);   AROM: Deferred  LE MMT:  MMT (out of 5) Right 07/08/2022 Left 07/08/2022  Hip flexion 5 5  Hip extension    Hip abduction (seated) 4+ 4+  Hip adduction (seated) 4+ 4+  Hip internal rotation    Hip external rotation    Knee flexion 4+ 4+  Knee extension 5 5  Ankle dorsiflexion 4+ 4+  Ankle plantarflexion    Ankle inversion    Ankle eversion    (* = pain; Blank rows = not tested)  Sensation Deferred  Reflexes Deferred  Cranial Nerves Deferred  Coordination/Cerebellar Deferred   FUNCTIONAL OUTCOME MEASURES   Results Comments  BERG 51/56 Fall risk, in need of intervention  DGI    FGA    TUG 12.4s WNL  5TSTS 11.5s WNL  6 Minute Walk Test    10 Meter Gait Speed    (Blank rows = not tested)   TODAY'S TREATMENT   SUBJECTIVE: Pt reports that she is doing alright today. Her stomach was bothering her this morning but it is feeling better now. Her left great toe continues to be sore. No specific questions currently.   PAIN: L great toe soreness but otherwise denies   Ther-ex  2MWT: 280' without assistive device  Pre-vitals: BP: 161/71 mmHg, 68 bpm, 98% Post-vitals: BP: 181/75 mmHg, HR: 76, SpO2: 97%  Seated marches with green tband 2 x 15; Seated clams with green tband 2 x 15; Seated adductor ball squeeze 2 x 15; Double black tband resisted gait forward, backward, R lateral, and  L lateral x 5 each direction;   Neuromuscular Re-education  Tandem balance alternating forward LE x 30s each; Tandem gait in // bars x multiple laps; Forward lunges with front foot on BOSU (round side up) x 15 BLE; Airex balance beam side stepping in // bars x multiple laps; Alternating 6" cone taps x 10 BLE, multiple episodes of knocking over cone; Airex alternating 6" cone taps x 10 BLE, multiple episodes of knocking over cone,    Not performed: Cross-over side stepping in // bars x multiple laps; Obstacle course with 6" hurdles in // bars with forward gait as well as lateral stepping x multiple bouts each direction; Incline standing balance with eyes open/closed x 30s each; Lateral 6" step-ups without UE support x 10 toward each side; Sit to stand from regular height chair without UE support 2 x 10; Squats without UE support x 10, cues for proper foot positioning and hip hinge; 6" alternating step ups without UE support x 10 leading with each LE; Lateral BOSU lunges (round side up) without UE support x 10 to each side; Supine SLR with 3# ankle weights (AW) 2 x 10 BLE: Hooklying bridges 2 x 10; Supine leg press against therapist's bodyweight 2 x 10 BLE; Seated hamstring curls with green tband 2 x 10 BLE; Standing hip strengthening with 5# AW: Hip flexion marches x 15 BLE; Hamstring curls x 15 BLE; Hip abduction x 15 BLE; Hip extension x 15 BLE; Seated LAQ with 5# ankle weights (AW) 2 x 15 BLE;   PATIENT EDUCATION:  Education details: Pt educated throughout session about proper posture and technique with exercises. Improved exercise technique, movement at  target joints, use of target muscles after min to mod verbal, visual, tactile cues, outcome measures, goals, and plan of care;  Person educated: Patient Education method: Explanation Education comprehension: verbalized understanding and returned demonstration   HOME EXERCISE PROGRAM: Access Code: [email or web address] URL:  [email or web address] Date: 06/09/2022 Prepared by: Roxana Hires  Exercises - Seated Hip Abduction with Resistance  - 1 x daily - 7 x weekly - 2 sets - 10 reps - 3s hold - Seated Hip Adduction Isometrics with Ball  - 1 x daily - 7 x weekly - 2 sets - 10 reps - 3s hold - Seated March with Resistance  - 1 x daily - 7 x weekly - 2 sets - 10 reps - 3s hold - Standing Tandem Balance with Counter Support  - 1 x daily - 7 x weekly - 2 x 30s with each leg forward hold - Single Leg Stance with Support  - 1 x daily - 7 x weekly - 2 x 30s with each leg forward hold   ASSESSMENT:  CLINICAL IMPRESSION: Pt demonstrates excellent motivation during session today. Updated 2MWT during session and it is worse today compared to the last update. Pt is somewhat limited due to left great toe pain. Additional time during session focused on both balance and strengthening. No HEP modifications on this date. Plan is to decrease frequency of therapy to 1x/wk. Pt advised to continue HEP and follow-up as scheduled. She will benefit from skilled PT to address weakness and imbalance and improve overall function.  REHAB POTENTIAL: Fair    CLINICAL DECISION MAKING: Unstable/unpredictable  EVALUATION COMPLEXITY: High   GOALS:  SHORT TERM GOALS: Target date: ACHIEVED  Pt will be independent with HEP in order to improve strength and balance in order to decrease fall risk and improve function at home. Baseline:  Goal status: ACHIEVED   LONG TERM GOALS: Target date: 07/20/2022  Pt will increase FOTO to at least 61 to demonstrate significant improvement in function at home related to balance  Baseline: 05/25/22: 55; 07/05/22: 54 Goal status: ONGOING  2.  Pt will improve BERG by at least 3 points in order to demonstrate clinically significant improvement in balance.   Baseline: 05/25/21: 51/56; 07/05/22: 53/56 Goal status: PARTIALLY MET  3.  Pt will improve ABC by at least 13% in order to demonstrate  clinically significant improvement in balance confidence.      Baseline: 05/25/22: 42.5%; 07/05/22: 45% Goal status: PARTIALLY MET  4.  Pt will increase 2MWT by at least 40' in order to demonstrate clinically significant improvement in cardiopulmonary endurance, balance, and community ambulation   Baseline: 05/25/22: To be completed; 06/09/22: 322' without assistive device; 07/05/22: Deferred; 07/07/22: 280' without assistive device (limited by L great toe soreness); Goal status: ONGOING   PLAN: PT FREQUENCY: 2x/week  PT DURATION: 8 weeks  PLANNED INTERVENTIONS: Therapeutic exercises, Therapeutic activity, Neuromuscular re-education, Balance training, Gait training, Patient/Family education, Joint manipulation, Joint mobilization, Canalith repositioning, Aquatic Therapy, Dry Needling, Cognitive remediation, Electrical stimulation, Spinal manipulation, Spinal mobilization, Cryotherapy, Moist heat, Traction, Ultrasound, Ionotophoresis 4mg /ml Dexamethasone, and Manual therapy  PLAN FOR NEXT SESSION: review/modify HEP as needed, progress balance and strength exercises   Lyndel Safe Rhyder Bratz PT, DPT, GCS  Samanthamarie Ezzell 07/08/2022, 1:41 PM

## 2022-07-13 ENCOUNTER — Encounter: Payer: Self-pay | Admitting: Podiatry

## 2022-07-13 ENCOUNTER — Ambulatory Visit (INDEPENDENT_AMBULATORY_CARE_PROVIDER_SITE_OTHER): Payer: Medicare Other | Admitting: Podiatry

## 2022-07-13 DIAGNOSIS — L600 Ingrowing nail: Secondary | ICD-10-CM

## 2022-07-13 DIAGNOSIS — Z9889 Other specified postprocedural states: Secondary | ICD-10-CM

## 2022-07-13 NOTE — Progress Notes (Signed)
She presents today for follow-up of her hallux matrixectomy left.  States that I think is doing fine.  Continues to soak in Betadine and warm water and applies Cortisporin Otic as directed.  She likes this because she could use this for swimming as well.  Objective: Vital signs are stable she alert and oriented x 3.  Pulses are palpable.  There is minimal erythema no edema cellulitis drainage or odor the margins appear to be healing very nicely with some scabs.  Assessment: Well-healing surgical toes.  Plan: Continue to soak the toe in Epsom salts or Betadine and warm water covered in the day leave open at bedtime and of course continue the Cortisporin otic as directed.  I will follow-up with her if this is not completely well in 2 weeks.

## 2022-07-13 NOTE — Therapy (Addendum)
OUTPATIENT PHYSICAL THERAPY BALANCE TREATMENT/PROGRESS NOTE  Dates of reporting period  05/25/22   to   07/14/22    Patient Name: Carol Carey MRN: [id number] DOB:1951-09-14, 71 y.o., female Today's Date: 07/15/2022   PT End of Session - 07/14/22 1443     Visit Number 10    Number of Visits 17    Date for PT Re-Evaluation 07/20/22    Authorization Type eval: 05/25/22    PT Start Time 1445    PT Stop Time 1530    PT Time Calculation (min) 45 min    Activity Tolerance Patient tolerated treatment well    Behavior During Therapy [facility] for tasks assessed/performed              Past Medical History:  Diagnosis Date   Aortic atherosclerosis ([place])    Arthritis    Ascending aorta dilatation ([place])    a.) measured 3.9cm by TTE on 06/29/21   CAD (coronary artery disease)    Carotid artery stenosis    Colon polyp    COPD (chronic obstructive pulmonary disease) ([place])    Degenerative joint disease    GERD (gastroesophageal reflux disease)    Hepatic steatosis    Hyperlipidemia    Hypertension    Hypothyroidism    Osteoporosis    Pulmonary emphysema ([facility])    Seasonal allergies    Skin cancer    lesion removed from nose   Sleep apnea    not on nocturnal PAP therapy   Spinal stenosis    Valvular insufficiency    a.) TTE on 06/29/2021 --> moderate AR, trivial MR/TR, mild PR   Past Surgical History:  Procedure Laterality Date   ABDOMINAL EXPOSURE N/A 09/30/2020   Procedure: ABDOMINAL EXPOSURE;  Surgeon: Algernon Huxley, MD;  Location: [facility] ORS;  Service: Vascular;  Laterality: N/A;   [place] N/A 09/30/2020   Procedure: L5-S1 ANTERIOR LUMBAR INTERBODY FUSION, L3-S1 INSTRUMENTATION;  Surgeon: Meade Maw, MD;  Location: [facility] ORS;  Service: Neurosurgery;  Laterality: N/A;   ANTERIOR CERVICAL DECOMP/DISCECTOMY FUSION N/A 07/09/2021   Procedure: C4-5 ANTERIOR CERVICAL DECOMPRESSION/DISCECTOMY FUSION 1 LEVEL;   Surgeon: Meade Maw, MD;  Location: [facility] ORS;  Service: Neurosurgery;  Laterality: N/A;  schedule as first case   APPENDECTOMY  1974   BACK SURGERY     BREAST BIOPSY Right 02/15/2021   Q clip, [place] bx, 9:30 5cmfn Usual Ductal Hyperplasia, PASH   BREAST BIOPSY Left 02/15/2021   x clip, [place] bx, 1:00 2cmfn, Usual Ductal Hyperplasia, Apocrine Metaplasia, PASH   BREAST EXCISIONAL BIOPSY Right 1985   Benign cycts removed   CESAREAN SECTION  1993   COLONOSCOPY     LUMBAR FUSION  2010   Dr. March Rummage   LUMBAR FUSION  2013   x 2 Dr. Ellender Hose   TONSILLECTOMY     TUBAL LIGATION  1974   UPPER GI ENDOSCOPY     Patient Active Problem List   Diagnosis Date Noted   Spondylolisthesis 09/30/2020   Chronic left-sided low back pain with left-sided sciatica 04/19/2020   Osteoporosis, post-menopausal 08/13/2019   Vitamin D insufficiency 08/13/2019   Hyperlipidemia 08/02/2019   Degenerative joint disease 08/02/2019   GERD (gastroesophageal reflux disease) 08/02/2019   Hypothyroid 08/02/2019   Osteoporosis 08/02/2019   Sleep apnea 08/02/2019   Tobacco use disorder 07/02/2019   Carotid stenosis 07/02/2019   Lymphadenitis, chronic 06/14/2019   Atherosclerosis of arteries 05/04/2018   Pulmonary emphysema ([place])  05/04/2018   Hx of adenomatous colonic polyps 05/02/2018   Skin cancer 06/17/2016   Post laminectomy syndrome 04/24/2012   Essential (primary) hypertension 1951-02-23    PCP: Idelle Crouch, MD  REFERRING PROVIDER: Meade Maw, MD  REFERRING DIAGNOSIS: Cervicalgia, s/p arthrodesis  THERAPY DIAG: Muscle weakness (generalized)  Unsteadiness on feet  RATIONALE FOR EVALUATION AND TREATMENT: Rehabilitation  ONSET DATE: 07/09/21  FOLLOW UP APPT WITH PROVIDER: No, pt advised to call if needed   FROM INITIAL EVALUATION  SUBJECTIVE:                                                                                                                                                                                          Chief Complaint: Imbalance  Pertinent History Pt referred for imbalance. She states that the balance issues started after her L5-S1 ALIF surgery in 2021. She has had multiple lumbar fusions however the balance issues never appeared until the most recent lumbar surgery. She also recently underwent C4-C5 ACDF on 07/09/2021. She reports persistent UE/LE weakness since that time. She never had PT after her neck surgery however reports that she doesn't have issues at this time related to her neck. Pt reports worsening stress urinary incontinence as well.  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, fishing [employer] (bowel/bladder changes, saddle paresthesia, personal history of cancer, h/o spinal tumors, h/o compression fx, chills/fever, night sweats, nausea, vomiting): Negative  Precautions: None  Weight Bearing Restrictions: No, no lifting restrictions reported by patient or identified in the medical record  [place] with: lives with their spouse Lives in: House/apartment, two levels, bedroom and bathroom on main floor, walk-in shower with built in seat and grab bars, 5 stairs from garage into the house with L railing during ascend Has following equipment at home: Single point cane, Walker - 2 wheeled, and Grab bars  Patient Goals: Improve balance and balance confidence    OBJECTIVE:   Patient  Surveys  FOTO: 55, predicted improvement to 28 ABC: 42.5%  Cognition Patient is oriented to person, place, and time.  Recent memory is intact.  Remote memory is intact.  Attention span and concentration are  intact.  Expressive speech is intact.  Patient's fund of knowledge is within normal limits for educational level.    Gross Musculoskeletal Assessment Tremor: None Bulk: Normal Tone: Normal  GAIT: Full gait assessment deferred however self-selected gait speed is decreased  Posture: Full posture assessment deferred. However pt with forward head and rounded shoulders as well as flattened low back (loss of natural lordosis);   AROM: Deferred  LE MMT:  MMT (out of 5) Right 07/15/2022 Left 07/15/2022  Hip flexion 5 5  Hip extension    Hip abduction (seated) 4+ 4+  Hip adduction (seated) 4+ 4+  Hip internal rotation    Hip external rotation    Knee flexion 4+ 4+  Knee extension 5 5  Ankle dorsiflexion 4+ 4+  Ankle plantarflexion    Ankle inversion    Ankle eversion    (* = pain; Blank rows = not tested)  Sensation Deferred  Reflexes Deferred  Cranial Nerves Deferred  Coordination/Cerebellar Deferred   FUNCTIONAL OUTCOME MEASURES   Results Comments  BERG 51/56 Fall risk, in need of intervention  DGI    FGA    TUG 12.4s WNL  5TSTS 11.5s WNL  6 Minute Walk Test    10 Meter Gait Speed    (Blank rows = not tested)   TODAY'S TREATMENT   SUBJECTIVE: Pt reports that she is doing alright today. She denies any pain today. She saw the podiatrist who was pleased with the healing of her left great toe. She did have a fall on Sunday when she tripped. She struck her right low back resulting in bruising but she denies any additional injury or pain. No specific questions currently.   PAIN: Denies   Neuromuscular Re-education  Tandem balance alternating forward LE x 30s each; Tandem gait in // bars x multiple laps; Obstacle course in // bars with 6" and 12"  hurdles without UE support; Airex alternating 6" step taps x 10 BLE; Airex semitandem balance with front foot on 6" step alternating forward LE x 30s each; Airex alternating 6" step-ups x 10 leading with each leg; Airex balance beam side stepping in // bars x multiple laps; Airex feet apart balance x 30s; Airex feet apart with horizontal and vertical head turns x 30s each; Airex feet apart eyes closed x 30s; Heel/toe raises without UE support 3s hold x 10 each; Cross-over side stepping in // bars x multiple laps;   Not performed: Seated marches with green tband 2 x 15; Seated clams with green tband 2 x 15; Seated adductor ball squeeze 2 x 15; Double black tband resisted gait forward, backward, R lateral, and L lateral x 5 each direction; Incline standing balance with eyes open/closed x 30s each; Lateral 6" step-ups without UE support x 10 toward each side; Sit to stand from regular height chair without UE support 2 x 10; Squats without UE support x 10, cues for proper foot positioning and hip hinge; Lateral BOSU lunges (round side up) without UE support x 10 to each side; Supine SLR with 3# ankle weights (AW) 2 x 10 BLE: Hooklying bridges 2 x 10; Supine leg press against therapist's bodyweight 2 x 10 BLE; Seated hamstring curls with green tband 2 x 10 BLE; Standing hip strengthening with 5# AW: Hip flexion marches x 15 BLE; Hamstring curls x 15 BLE; Hip abduction x 15 BLE; Hip extension x 15 BLE; Seated LAQ with 5# ankle weights (AW) 2 x 15 BLE; Forward lunges with front foot on BOSU (  round side up) x 15 BLE;    PATIENT EDUCATION:  Education details: Pt educated throughout session about proper posture and technique with exercises. Improved exercise technique, movement at target joints, use of target muscles after min to mod verbal, visual, tactile cues, outcome measures, goals, and plan of care;  Person educated: Patient Education method: Explanation Education comprehension:  verbalized understanding and returned demonstration   HOME EXERCISE PROGRAM: Access Code: [email or web address] URL: [email or web address] Date: 06/09/2022 Prepared by: Roxana Hires  Exercises - Seated Hip Abduction with Resistance  - 1 x daily - 7 x weekly - 2 sets - 10 reps - 3s hold - Seated Hip Adduction Isometrics with Ball  - 1 x daily - 7 x weekly - 2 sets - 10 reps - 3s hold - Seated March with Resistance  - 1 x daily - 7 x weekly - 2 sets - 10 reps - 3s hold - Standing Tandem Balance with Counter Support  - 1 x daily - 7 x weekly - 2 x 30s with each leg forward hold - Single Leg Stance with Support  - 1 x daily - 7 x weekly - 2 x 30s with each leg forward hold   ASSESSMENT:  CLINICAL IMPRESSION: Pt demonstrates excellent motivation during session today. Updated outcome measures and goals with patient during 8/1 and 8/3/ visits. No significant change in her FOTO score and her ABC is only slightly higher. Her BERG improved from 51/56 to 53/56 indicating slight improvement in balance. Subjectively pt reporting slight improvement in strength and balance since starting with therapy. 2MWT during session was worse compared to the last update. Session today focused on balance exercises. No HEP modifications on this date. Plan is to continue therapy at 1x/wk. Pt advised to continue HEP and follow-up as scheduled. She will benefit from skilled PT to address weakness and imbalance and improve overall function.  REHAB POTENTIAL: Fair    CLINICAL DECISION MAKING: Unstable/unpredictable  EVALUATION COMPLEXITY: High   GOALS:  SHORT TERM GOALS: Target date: ACHIEVED  Pt will be independent with HEP in order to improve strength and balance in order to decrease fall risk and improve function at home. Baseline:  Goal status: ACHIEVED   LONG TERM GOALS: Target date: 07/20/2022  Pt will increase FOTO to at least 61 to demonstrate significant improvement in function at home related to  balance  Baseline: 05/25/22: 55; 07/05/22: 54 Goal status: ONGOING  2.  Pt will improve BERG by at least 3 points in order to demonstrate clinically significant improvement in balance.   Baseline: 05/25/21: 51/56; 07/05/22: 53/56 Goal status: PARTIALLY MET  3.  Pt will improve ABC by at least 13% in order to demonstrate clinically significant improvement in balance confidence.      Baseline: 05/25/22: 42.5%; 07/05/22: 45% Goal status: PARTIALLY MET  4.  Pt will increase 2MWT by at least 40' in order to demonstrate clinically significant improvement in cardiopulmonary endurance, balance, and community ambulation   Baseline: 05/25/22: To be completed; 06/09/22: 322' without assistive device; 07/05/22: Deferred; 07/07/22: 280' without assistive device (limited by L great toe soreness); Goal status: ONGOING   PLAN: PT FREQUENCY: 2x/week  PT DURATION: 8 weeks  PLANNED INTERVENTIONS: Therapeutic exercises, Therapeutic activity, Neuromuscular re-education, Balance training, Gait training, Patient/Family education, Joint manipulation, Joint mobilization, Canalith repositioning, Aquatic Therapy, Dry Needling, Cognitive remediation, Electrical stimulation, Spinal manipulation, Spinal mobilization, Cryotherapy, Moist heat, Traction, Ultrasound, Ionotophoresis 43m/ml Dexamethasone, and Manual therapy  PLAN FOR NEXT SESSION: review/modify HEP as needed, progress  balance and strength exercises   Lyndel Safe Michalina Calbert PT, DPT, GCS  Luman Holway 07/15/2022, 1:43 PM

## 2022-07-14 ENCOUNTER — Ambulatory Visit: Payer: Medicare Other

## 2022-07-14 DIAGNOSIS — M6281 Muscle weakness (generalized): Secondary | ICD-10-CM | POA: Diagnosis not present

## 2022-07-14 DIAGNOSIS — R2681 Unsteadiness on feet: Secondary | ICD-10-CM

## 2022-07-20 NOTE — Therapy (Signed)
OUTPATIENT PHYSICAL THERAPY BALANCE TREATMENT/RECERTIFICATION   Patient Name: CADENCE MINTON MRN: [id number] DOB:Aug 21, 1951, 71 y.o., female Today's Date: 07/22/2022   PT End of Session - 07/21/22 1759     Visit Number 11    Number of Visits 33    Date for PT Re-Evaluation 09/15/22    Authorization Type eval: 05/25/22    PT Start Time 1445    PT Stop Time 1530    PT Time Calculation (min) 45 min    Activity Tolerance Patient tolerated treatment well    Behavior During Therapy [facility] for tasks assessed/performed             Past Medical History:  Diagnosis Date   Aortic atherosclerosis ([place])    Arthritis    Ascending aorta dilatation ([place])    a.) measured 3.9cm by TTE on 06/29/21   CAD (coronary artery disease)    Carotid artery stenosis    Colon polyp    COPD (chronic obstructive pulmonary disease) ([place])    Degenerative joint disease    GERD (gastroesophageal reflux disease)    Hepatic steatosis    Hyperlipidemia    Hypertension    Hypothyroidism    Osteoporosis    Pulmonary emphysema ([facility])    Seasonal allergies    Skin cancer    lesion removed from nose   Sleep apnea    not on nocturnal PAP therapy   Spinal stenosis    Valvular insufficiency    a.) TTE on 06/29/2021 --> moderate AR, trivial MR/TR, mild PR   Past Surgical History:  Procedure Laterality Date   ABDOMINAL EXPOSURE N/A 09/30/2020   Procedure: ABDOMINAL EXPOSURE;  Surgeon: Algernon Huxley, MD;  Location: [facility] ORS;  Service: Vascular;  Laterality: N/A;   [place] N/A 09/30/2020   Procedure: L5-S1 ANTERIOR LUMBAR INTERBODY FUSION, L3-S1 INSTRUMENTATION;  Surgeon: Meade Maw, MD;  Location: [facility] OR[facility];  Service: Neurosurgery;  Laterality: N/A;   ANTERIOR CERVICAL DECOMP/DISCECTOMY FUSION N/A 07/09/2021   Procedure: C4-5 ANTERIOR CERVICAL DECOMPRESSION/DISCECTOMY FUSION 1 LEVEL;  Surgeon: Meade Maw, MD;  Location: [facility] ORS;   Service: Neurosurgery;  Laterality: N/A;  schedule as first case   APPENDECTOMY  1974   BACK SURGERY     BREAST BIOPSY Right 02/15/2021   Q clip, [place] bx, 9:30 5cmfn Usual Ductal Hyperplasia, PASH   BREAST BIOPSY Left 02/15/2021   x clip, [place] bx, 1:00 2cmfn, Usual Ductal Hyperplasia, Apocrine Metaplasia, PASH   BREAST EXCISIONAL BIOPSY Right 1985   Benign cycts removed   CESAREAN SECTION  1993   COLONOSCOPY     LUMBAR FUSION  2010   Dr. March Rummage   LUMBAR FUSION  2013   x 2 Dr. Ellender Hose   TONSILLECTOMY     TUBAL LIGATION  1974   UPPER GI ENDOSCOPY     Patient Active Problem List   Diagnosis Date Noted   Spondylolisthesis 09/30/2020   Chronic left-sided low back pain with left-sided sciatica 04/19/2020   Osteoporosis, post-menopausal 08/13/2019   Vitamin D insufficiency 08/13/2019   Hyperlipidemia 08/02/2019   Degenerative joint disease 08/02/2019   GERD (gastroesophageal reflux disease) 08/02/2019   Hypothyroid 08/02/2019   Osteoporosis 08/02/2019   Sleep apnea 08/02/2019   Tobacco use disorder 07/02/2019   Carotid stenosis 07/02/2019   Lymphadenitis, chronic 06/14/2019   Atherosclerosis of arteries 05/04/2018   Pulmonary emphysema ([place]) 05/04/2018   Hx of adenomatous colonic polyps 05/02/2018   Skin cancer 06/17/2016  Post laminectomy syndrome 04/24/2012   Essential (primary) hypertension 1951/03/11    PCP: Idelle Crouch, MD  REFERRING PROVIDER: Meade Maw, MD  REFERRING DIAGNOSIS: Cervicalgia, s/p arthrodesis  THERAPY DIAG: Muscle weakness (generalized)  Unsteadiness on feet  RATIONALE FOR EVALUATION AND TREATMENT: Rehabilitation  ONSET DATE: 07/09/21  FOLLOW UP APPT WITH PROVIDER: No, pt advised to call if needed   FROM INITIAL EVALUATION  SUBJECTIVE:                                                                                                                                                                                         Chief Complaint:  Imbalance  Pertinent History Pt referred for imbalance. She states that the balance issues started after her L5-S1 ALIF surgery in 2021. She has had multiple lumbar fusions however the balance issues never appeared until the most recent lumbar surgery. She also recently underwent C4-C5 ACDF on 07/09/2021. She reports persistent UE/LE weakness since that time. She never had PT after her neck surgery however reports that she doesn't have issues at this time related to her neck. Pt reports worsening stress urinary incontinence as well.  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, fishing [employer] (bowel/bladder changes, saddle paresthesia, personal history of cancer, h/o spinal tumors, h/o compression fx, chills/fever, night sweats, nausea, vomiting): Negative  Precautions: None  Weight Bearing Restrictions: No, no lifting restrictions reported by patient or identified in the medical record  Melvina with: lives with their spouse Lives in: House/apartment, two levels, bedroom and bathroom on main floor, walk-in shower with built in seat and grab bars, 5 stairs from garage into the house with L railing during ascend Has following equipment at home: Single point cane, Walker - 2 wheeled, and Grab bars  Patient Goals: Improve balance and balance confidence    OBJECTIVE:   Patient Surveys  FOTO: 55, predicted improvement to 42 ABC:  42.5%  Cognition Patient is oriented to person, place, and time.  Recent memory is intact.  Remote memory is intact.  Attention span and concentration are intact.  Expressive speech is intact.  Patient's fund of knowledge is within normal limits for  educational level.    Gross Musculoskeletal Assessment Tremor: None Bulk: Normal Tone: Normal  GAIT: Full gait assessment deferred however self-selected gait speed is decreased  Posture: Full posture assessment deferred. However pt with forward head and rounded shoulders as well as flattened low back (loss of natural lordosis);   AROM: Deferred  LE MMT:  MMT (out of 5) Right 07/22/2022 Left 07/22/2022  Hip flexion 5 5  Hip extension    Hip abduction (seated) 4+ 4+  Hip adduction (seated) 4+ 4+  Hip internal rotation    Hip external rotation    Knee flexion 4+ 4+  Knee extension 5 5  Ankle dorsiflexion 4+ 4+  Ankle plantarflexion    Ankle inversion    Ankle eversion    (* = pain; Blank rows = not tested)  Sensation Deferred  Reflexes Deferred  Cranial Nerves Deferred  Coordination/Cerebellar Deferred   FUNCTIONAL OUTCOME MEASURES   Results Comments  BERG 51/56 Fall risk, in need of intervention  DGI    FGA    TUG 12.4s WNL  5TSTS 11.5s WNL  6 Minute Walk Test    10 Meter Gait Speed    (Blank rows = not tested)   TODAY'S TREATMENT   SUBJECTIVE: Pt reports that she is doing alright today. She denies any pain today. Her toe continues healing and she denies falls since the last therapy session.  No specific questions currently.   PAIN: Denies  Ther-ex  Standing mini squats 2 x 15; Standing hip strengthening with 5# AW: Hip flexion marches x 15 BLE; Hamstring curls x 15 BLE; Hip abduction x 15 BLE; Hip extension x 15 BLE;  Forward 6" step-ups without UE support x 10 leading with each leg; Seated LAQ with 5# ankle weights (AW) 2 x 15 BLE; Seated clams with green tband 2 x 15; Seated adductor  ball squeeze 2 x 15;  Forward lunges with front foot on BOSU (round side up) x 10 BLE; Lateral BOSU lunges (round side up) without UE support x 10 to each side;   Neuromuscular Re-education  Tandem balance alternating forward LE x 30s each; Tandem gait in // bars x multiple laps; Heel/toe raises without UE support 3s hold x 10 each; Cross-over side stepping in // bars x multiple laps;   Not performed: Hooklying bridges 2 x 10; Supine leg press against therapist's bodyweight 2 x 10 BLE; Seated marches with green tband 2 x 15; Incline standing balance with eyes open/closed x 30s each; Sit to stand from regular height chair without UE support 2 x 10; Supine SLR with 3# ankle weights (AW) 2 x 10 BLE: Seated hamstring curls with green tband 2 x 10 BLE; Obstacle course in // bars with 6" and 12" hurdles without UE support; Airex alternating 6" step taps x 10 BLE; Airex semitandem balance with front foot on 6" step alternating forward LE x 30s each; Airex alternating 6" step-ups x 10 leading with each leg; Airex balance beam side stepping in // bars x multiple laps; Airex feet apart balance x 30s; Airex feet apart with horizontal and vertical head turns x 30s each; Airex feet apart eyes closed x 30s;   PATIENT EDUCATION:  Education details: Pt educated throughout session about proper posture and technique with exercises. Improved exercise technique, movement at target joints, use of target muscles after min to mod verbal, visual, tactile cues, outcome measures, goals, and plan of care;  Person educated: Patient Education method: Explanation Education comprehension: verbalized understanding and  returned demonstration   HOME EXERCISE PROGRAM: Access Code: [email or web address] URL: [email or web address] Date: 06/09/2022 Prepared by: Roxana Hires  Exercises - Seated Hip Abduction with Resistance  - 1 x daily - 7 x weekly - 2 sets - 10 reps - 3s hold - Seated Hip Adduction  Isometrics with Ball  - 1 x daily - 7 x weekly - 2 sets - 10 reps - 3s hold - Seated March with Resistance  - 1 x daily - 7 x weekly - 2 sets - 10 reps - 3s hold - Standing Tandem Balance with Counter Support  - 1 x daily - 7 x weekly - 2 x 30s with each leg forward hold - Single Leg Stance with Support  - 1 x daily - 7 x weekly - 2 x 30s with each leg forward hold   ASSESSMENT:  CLINICAL IMPRESSION: Pt demonstrates excellent motivation during session today. Updated outcome measures and goals with patient during 8/1 and 8/3/ visits. No significant change in her FOTO score and her ABC is only slightly higher. Her BERG improved from 51/56 to 53/56 indicating slight improvement in balance. Subjectively pt reporting slight improvement in strength and balance since starting with therapy. 2MWT during session was worse compared to the last update. Session today focused on balance exercises. No HEP modifications on this date. Plan is to continue therapy at 1x/wk with focus on home program. Pt advised to continue HEP and follow-up as scheduled. She will benefit from skilled PT to address weakness and imbalance and improve overall function.  REHAB POTENTIAL: Fair    CLINICAL DECISION MAKING: Unstable/unpredictable  EVALUATION COMPLEXITY: High   GOALS:  SHORT TERM GOALS: Target date: ACHIEVED  Pt will be independent with HEP in order to improve strength and balance in order to decrease fall risk and improve function at home. Baseline:  Goal status: ACHIEVED   LONG TERM GOALS: Target date: 07/20/2022  Pt will increase FOTO to at least 61 to demonstrate significant improvement in function at home related to balance  Baseline: 05/25/22: 55; 07/05/22: 54 Goal status: ONGOING  2.  Pt will improve BERG by at least 3 points in order to demonstrate clinically significant improvement in balance.   Baseline: 05/25/21: 51/56; 07/05/22: 53/56 Goal status: PARTIALLY MET  3.  Pt will improve ABC by at least 13%  in order to demonstrate clinically significant improvement in balance confidence.      Baseline: 05/25/22: 42.5%; 07/05/22: 45% Goal status: PARTIALLY MET  4.  Pt will increase 2MWT by at least 40' in order to demonstrate clinically significant improvement in cardiopulmonary endurance, balance, and community ambulation   Baseline: 05/25/22: To be completed; 06/09/22: 322' without assistive device; 07/05/22: Deferred; 07/07/22: 280' without assistive device (limited by L great toe soreness); Goal status: ONGOING   PLAN: PT FREQUENCY: 1-2x/week  PT DURATION: 8 weeks  PLANNED INTERVENTIONS: Therapeutic exercises, Therapeutic activity, Neuromuscular re-education, Balance training, Gait training, Patient/Family education, Joint manipulation, Joint mobilization, Canalith repositioning, Aquatic Therapy, Dry Needling, Cognitive remediation, Electrical stimulation, Spinal manipulation, Spinal mobilization, Cryotherapy, Moist heat, Traction, Ultrasound, Ionotophoresis 40m/ml Dexamethasone, and Manual therapy  PLAN FOR NEXT SESSION: review/modify HEP as needed, progress balance and strength exercises   JLyndel SafeHuprich PT, DPT, GCS  Brandi Tomlinson 07/22/2022, 11:44 AM

## 2022-07-21 ENCOUNTER — Ambulatory Visit: Payer: Medicare Other

## 2022-07-21 DIAGNOSIS — M6281 Muscle weakness (generalized): Secondary | ICD-10-CM

## 2022-07-21 DIAGNOSIS — R2681 Unsteadiness on feet: Secondary | ICD-10-CM

## 2022-07-28 ENCOUNTER — Ambulatory Visit: Payer: Medicare Other

## 2022-07-28 DIAGNOSIS — R2681 Unsteadiness on feet: Secondary | ICD-10-CM

## 2022-07-28 DIAGNOSIS — M6281 Muscle weakness (generalized): Secondary | ICD-10-CM

## 2022-07-28 NOTE — Therapy (Signed)
OUTPATIENT PHYSICAL THERAPY BALANCE TREATMENT   Patient Name: Carol Carey MRN: [id number] DOB:1951/03/28, 71 y.o., female Today's Date: 07/29/2022   PT End of Session - 07/28/22 1440     Visit Number 12    Number of Visits 33    Date for PT Re-Evaluation 09/15/22    Authorization Type eval: 05/25/22    PT Start Time 1445    PT Stop Time 1530    PT Time Calculation (min) 45 min    Activity Tolerance Patient tolerated treatment well    Behavior During Therapy [facility] for tasks assessed/performed             Past Medical History:  Diagnosis Date   Aortic atherosclerosis ([place])    Arthritis    Ascending aorta dilatation ([place])    a.) measured 3.9cm by TTE on 06/29/21   CAD (coronary artery disease)    Carotid artery stenosis    Colon polyp    COPD (chronic obstructive pulmonary disease) ([place])    Degenerative joint disease    GERD (gastroesophageal reflux disease)    Hepatic steatosis    Hyperlipidemia    Hypertension    Hypothyroidism    Osteoporosis    Pulmonary emphysema ([facility])    Seasonal allergies    Skin cancer    lesion removed from nose   Sleep apnea    not on nocturnal PAP therapy   Spinal stenosis    Valvular insufficiency    a.) TTE on 06/29/2021 --> moderate AR, trivial MR/TR, mild PR   Past Surgical History:  Procedure Laterality Date   ABDOMINAL EXPOSURE N/A 09/30/2020   Procedure: ABDOMINAL EXPOSURE;  Surgeon: Algernon Huxley, MD;  Location: [facility] ORS;  Service: Vascular;  Laterality: N/A;   [place] N/A 09/30/2020   Procedure: L5-S1 ANTERIOR LUMBAR INTERBODY FUSION, L3-S1 INSTRUMENTATION;  Surgeon: Meade Maw, MD;  Location: [facility] ORS;  Service: Neurosurgery;  Laterality: N/A;   ANTERIOR CERVICAL DECOMP/DISCECTOMY FUSION N/A 07/09/2021   Procedure: C4-5 ANTERIOR CERVICAL DECOMPRESSION/DISCECTOMY FUSION 1 LEVEL;  Surgeon: Meade Maw, MD;  Location: [facility] ORS;  Service: Neurosurgery;   Laterality: N/A;  schedule as first case   APPENDECTOMY  1974   BACK SURGERY     BREAST BIOPSY Right 02/15/2021   Q clip, [place] bx, 9:30 5cmfn Usual Ductal Hyperplasia, PASH   BREAST BIOPSY Left 02/15/2021   x clip, [place] bx, 1:00 2cmfn, Usual Ductal Hyperplasia, Apocrine Metaplasia, PASH   BREAST EXCISIONAL BIOPSY Right 1985   Benign cycts removed   CESAREAN SECTION  1993   COLONOSCOPY     LUMBAR FUSION  2010   Dr. March Rummage   LUMBAR FUSION  2013   x 2 Dr. Ellender Hose   TONSILLECTOMY     TUBAL LIGATION  1974   UPPER GI ENDOSCOPY     Patient Active Problem List   Diagnosis Date Noted   Spondylolisthesis 09/30/2020   Chronic left-sided low back pain with left-sided sciatica 04/19/2020   Osteoporosis, post-menopausal 08/13/2019   Vitamin D insufficiency 08/13/2019   Hyperlipidemia 08/02/2019   Degenerative joint disease 08/02/2019   GERD (gastroesophageal reflux disease) 08/02/2019   Hypothyroid 08/02/2019   Osteoporosis 08/02/2019   Sleep apnea 08/02/2019   Tobacco use disorder 07/02/2019   Carotid stenosis 07/02/2019   Lymphadenitis, chronic 06/14/2019   Atherosclerosis of arteries 05/04/2018   Pulmonary emphysema ([place]) 05/04/2018   Hx of adenomatous colonic polyps 05/02/2018   Skin cancer 06/17/2016  Post laminectomy syndrome 04/24/2012   Essential (primary) hypertension 05-13-1951    PCP: Idelle Crouch, MD  REFERRING PROVIDER: Meade Maw, MD  REFERRING DIAGNOSIS: Cervicalgia, s/p arthrodesis  THERAPY DIAG: Muscle weakness (generalized)  Unsteadiness on feet  RATIONALE FOR EVALUATION AND TREATMENT: Rehabilitation  ONSET DATE: 07/09/21  FOLLOW UP APPT WITH PROVIDER: No, pt advised to call if needed   FROM INITIAL EVALUATION  SUBJECTIVE:                                                                                                                                                                                         Chief Complaint: Imbalance  Pertinent  History Pt referred for imbalance. She states that the balance issues started after her L5-S1 ALIF surgery in 2021. She has had multiple lumbar fusions however the balance issues never appeared until the most recent lumbar surgery. She also recently underwent C4-C5 ACDF on 07/09/2021. She reports persistent UE/LE weakness since that time. She never had PT after her neck surgery however reports that she doesn't have issues at this time related to her neck. Pt reports worsening stress urinary incontinence as well.  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, fishing [employer] (bowel/bladder changes, saddle paresthesia, personal history of cancer, h/o spinal tumors, h/o compression fx, chills/fever, night sweats, nausea, vomiting): Negative  Precautions: None  Weight Bearing Restrictions: No, no lifting restrictions reported by patient or identified in the medical record  [place] with: lives with their spouse Lives in: House/apartment, two levels, bedroom and bathroom on main floor, walk-in shower with built in seat and grab bars, 5 stairs from garage into the house with L railing during ascend Has following equipment at home: Single point cane, Walker - 2 wheeled, and Grab bars  Patient Goals: Improve balance and balance confidence    OBJECTIVE:   Patient Surveys  FOTO: 55, predicted improvement to 53 ABC:  42.5%  Cognition Patient is oriented to person, place, and time.  Recent memory is intact.  Remote memory is intact.  Attention span and concentration are intact.  Expressive speech is intact.  Patient's fund of knowledge is within normal limits for  educational level.    Gross Musculoskeletal Assessment Tremor: None Bulk: Normal Tone: Normal  GAIT: Full gait assessment deferred however self-selected gait speed is decreased  Posture: Full posture assessment deferred. However pt with forward head and rounded shoulders as well as flattened low back (loss of natural lordosis);   AROM: Deferred  LE MMT:  MMT (out of 5) Right 07/29/2022 Left 07/29/2022  Hip flexion 5 5  Hip extension    Hip abduction (seated) 4+ 4+  Hip adduction (seated) 4+ 4+  Hip internal rotation    Hip external rotation    Knee flexion 4+ 4+  Knee extension 5 5  Ankle dorsiflexion 4+ 4+  Ankle plantarflexion    Ankle inversion    Ankle eversion    (* = pain; Blank rows = not tested)  Sensation Deferred  Reflexes Deferred  Cranial Nerves Deferred  Coordination/Cerebellar Deferred   FUNCTIONAL OUTCOME MEASURES   Results Comments  BERG 51/56 Fall risk, in need of intervention  DGI    FGA    TUG 12.4s WNL  5TSTS 11.5s WNL  6 Minute Walk Test    10 Meter Gait Speed    (Blank rows = not tested)   TODAY'S TREATMENT   SUBJECTIVE: Pt reports that she is doing alright today. She denies any pain today. Her toe continues healing and she denies falls since the last therapy session.  No specific questions currently.   PAIN: Denies  Ther-ex  Standing mini squats 2 x 15; Forward 6" step-ups without UE support x 10 leading with each leg; Cross-over step-ups without UE support x 10 leading with each leg; Nautilus resisted gait 50# forward, backward, R lateral, and L lateral x 3 each direction; Seated clams with manual resistance x 15; Seated adductor with manual resistance x 15; Tandem  gait 15' x 4;   Not performed: Hooklying bridges 2 x 10; Supine leg press against therapist's bodyweight 2 x 10 BLE; Seated marches with green tband 2 x 15; Incline standing balance with eyes open/closed x 30s each; Sit to stand from regular height chair without UE support 2 x 10; Supine SLR with 3# ankle weights (AW) 2 x 10 BLE: Seated hamstring curls with green tband 2 x 10 BLE; Obstacle course in // bars with 6" and 12" hurdles without UE support; Airex alternating 6" step taps x 10 BLE; Airex semitandem balance with front foot on 6" step alternating forward LE x 30s each; Airex alternating 6" step-ups x 10 leading with each leg; Airex balance beam side stepping in // bars x multiple laps; Airex feet apart balance x 30s; Airex feet apart with horizontal and vertical head turns x 30s each; Airex feet apart eyes closed x 30s; Standing hip strengthening with 5# AW: Hip flexion marches x 15 BLE; Hamstring curls x 15 BLE; Hip abduction x 15 BLE; Hip extension x 15 BLE; Seated LAQ with 5# ankle weights (AW) 2 x 15 BLE; Forward lunges with front foot on BOSU (round side up) x 10 BLE; Lateral BOSU lunges (round side up) without UE support x 10 to each side; Tandem balance alternating forward LE x 30s each; Heel/toe raises without UE support 3s hold x 10 each; Cross-over side stepping in // bars x multiple laps;   PATIENT EDUCATION:  Education details: Pt educated throughout session about proper posture and technique with exercises. Improved exercise technique, movement at target joints, use of target muscles after min to mod verbal, visual, tactile cues, outcome measures, goals, and  plan of care;  Person educated: Patient Education method: Explanation Education comprehension: verbalized understanding and returned demonstration   HOME EXERCISE PROGRAM: Access Code: [email or web address] URL: [email or web address] Date: 06/09/2022 Prepared by: Roxana Hires  Exercises - Seated  Hip Abduction with Resistance  - 1 x daily - 7 x weekly - 2 sets - 10 reps - 3s hold - Seated Hip Adduction Isometrics with Ball  - 1 x daily - 7 x weekly - 2 sets - 10 reps - 3s hold - Seated March with Resistance  - 1 x daily - 7 x weekly - 2 sets - 10 reps - 3s hold - Standing Tandem Balance with Counter Support  - 1 x daily - 7 x weekly - 2 x 30s with each leg forward hold - Single Leg Stance with Support  - 1 x daily - 7 x weekly - 2 x 30s with each leg forward hold   ASSESSMENT:  CLINICAL IMPRESSION: Pt demonstrates excellent motivation during session today. Session today focused on strengthening and pt is able to complete without issue. She does struggle with hip stability during tandem gait. No HEP modifications on this date. Plan is to continue therapy at 1x/wk with focus on home program. Pt advised to continue HEP and follow-up as scheduled. She will benefit from skilled PT to address weakness and imbalance and improve overall function.  REHAB POTENTIAL: Fair    CLINICAL DECISION MAKING: Unstable/unpredictable  EVALUATION COMPLEXITY: High   GOALS:  SHORT TERM GOALS: Target date: ACHIEVED  Pt will be independent with HEP in order to improve strength and balance in order to decrease fall risk and improve function at home. Baseline:  Goal status: ACHIEVED   LONG TERM GOALS: Target date: 07/20/2022  Pt will increase FOTO to at least 61 to demonstrate significant improvement in function at home related to balance  Baseline: 05/25/22: 55; 07/05/22: 54 Goal status: ONGOING  2.  Pt will improve BERG by at least 3 points in order to demonstrate clinically significant improvement in balance.   Baseline: 05/25/21: 51/56; 07/05/22: 53/56 Goal status: PARTIALLY MET  3.  Pt will improve ABC by at least 13% in order to demonstrate clinically significant improvement in balance confidence.      Baseline: 05/25/22: 42.5%; 07/05/22: 45% Goal status: PARTIALLY MET  4.  Pt will increase 2MWT by  at least 40' in order to demonstrate clinically significant improvement in cardiopulmonary endurance, balance, and community ambulation   Baseline: 05/25/22: To be completed; 06/09/22: 322' without assistive device; 07/05/22: Deferred; 07/07/22: 280' without assistive device (limited by L great toe soreness); Goal status: ONGOING   PLAN: PT FREQUENCY: 1-2x/week  PT DURATION: 8 weeks  PLANNED INTERVENTIONS: Therapeutic exercises, Therapeutic activity, Neuromuscular re-education, Balance training, Gait training, Patient/Family education, Joint manipulation, Joint mobilization, Canalith repositioning, Aquatic Therapy, Dry Needling, Cognitive remediation, Electrical stimulation, Spinal manipulation, Spinal mobilization, Cryotherapy, Moist heat, Traction, Ultrasound, Ionotophoresis 4mg /ml Dexamethasone, and Manual therapy  PLAN FOR NEXT SESSION: review/modify HEP as needed, progress balance and strength exercises   Lyndel Safe Saahil Herbster PT, DPT, GCS  [place] 07/29/2022, 1:22 PM

## 2022-08-02 NOTE — Therapy (Signed)
OUTPATIENT PHYSICAL THERAPY BALANCE TREATMENT   Patient Name: Carol Carey MRN: [id number] DOB:[birth year], 71 y.o., female Today's Date: 08/04/2022   PT End of Session - 08/04/22 1442     Visit Number 13    Number of Visits 33    Date for PT Re-Evaluation 09/15/22    Authorization Type eval: 05/25/22    PT Start Time 1445    PT Stop Time 1530    PT Time Calculation (min) 45 min    Activity Tolerance Patient tolerated treatment well    Behavior During Therapy [facility] for tasks assessed/performed             Past Medical History:  Diagnosis Date   Aortic atherosclerosis ([place])    Arthritis    Ascending aorta dilatation ([place])    a.) measured 3.9cm by TTE on 06/29/21   CAD (coronary artery disease)    Carotid artery stenosis    Colon polyp    COPD (chronic obstructive pulmonary disease) ([place])    Degenerative joint disease    GERD (gastroesophageal reflux disease)    Hepatic steatosis    Hyperlipidemia    Hypertension    Hypothyroidism    Osteoporosis    Pulmonary emphysema ([facility])    Seasonal allergies    Skin cancer    lesion removed from nose   Sleep apnea    not on nocturnal PAP therapy   Spinal stenosis    Valvular insufficiency    a.) TTE on 06/29/2021 --> moderate AR, trivial MR/TR, mild PR   Past Surgical History:  Procedure Laterality Date   ABDOMINAL EXPOSURE N/A 09/30/2020   Procedure: ABDOMINAL EXPOSURE;  Surgeon: Algernon Huxley, MD;  Location: [facility] ORS;  Service: Vascular;  Laterality: N/A;   [place] N/A 09/30/2020   Procedure: L5-S1 ANTERIOR LUMBAR INTERBODY FUSION, L3-S1 INSTRUMENTATION;  Surgeon: Meade Maw, MD;  Location: [facility] ORS;  Service: Neurosurgery;  Laterality: N/A;   ANTERIOR CERVICAL DECOMP/DISCECTOMY FUSION N/A 07/09/2021   Procedure: C4-5 ANTERIOR CERVICAL DECOMPRESSION/DISCECTOMY FUSION 1 LEVEL;  Surgeon: Meade Maw, MD;  Location: [facility] ORS;  Service: Neurosurgery;   Laterality: N/A;  schedule as first case   APPENDECTOMY  1974   BACK SURGERY     BREAST BIOPSY Right 02/15/2021   Q clip, [place] bx, 9:30 5cmfn Usual Ductal Hyperplasia, PASH   BREAST BIOPSY Left 02/15/2021   x clip, [place] bx, 1:00 2cmfn, Usual Ductal Hyperplasia, Apocrine Metaplasia, PASH   BREAST EXCISIONAL BIOPSY Right 1985   Benign cycts removed   CESAREAN SECTION  1993   COLONOSCOPY     LUMBAR FUSION  2010   Dr. March Rummage   LUMBAR FUSION  2013   x 2 Dr. Ellender Hose   TONSILLECTOMY     TUBAL LIGATION  1974   UPPER GI ENDOSCOPY     Patient Active Problem List   Diagnosis Date Noted   Spondylolisthesis 09/30/2020   Chronic left-sided low back pain with left-sided sciatica 04/19/2020   Osteoporosis, post-menopausal 08/13/2019   Vitamin D insufficiency 08/13/2019   Hyperlipidemia 08/02/2019   Degenerative joint disease 08/02/2019   GERD (gastroesophageal reflux disease) 08/02/2019   Hypothyroid 08/02/2019   Osteoporosis 08/02/2019   Sleep apnea 08/02/2019   Tobacco use disorder 07/02/2019   Carotid stenosis 07/02/2019   Lymphadenitis, chronic 06/14/2019   Atherosclerosis of arteries 05/04/2018   Pulmonary emphysema ([place]) 05/04/2018   Hx of adenomatous colonic polyps 05/02/2018   Skin cancer 06/17/2016  Post laminectomy syndrome 04/24/2012   Essential (primary) hypertension 05-13-1951    PCP: Idelle Crouch, MD  REFERRING PROVIDER: Meade Maw, MD  REFERRING DIAGNOSIS: Cervicalgia, s/p arthrodesis  THERAPY DIAG: Muscle weakness (generalized)  Unsteadiness on feet  RATIONALE FOR EVALUATION AND TREATMENT: Rehabilitation  ONSET DATE: 07/09/21  FOLLOW UP APPT WITH PROVIDER: No, pt advised to call if needed   FROM INITIAL EVALUATION  SUBJECTIVE:                                                                                                                                                                                         Chief Complaint: Imbalance  Pertinent  History Pt referred for imbalance. She states that the balance issues started after her L5-S1 ALIF surgery in 2021. She has had multiple lumbar fusions however the balance issues never appeared until the most recent lumbar surgery. She also recently underwent C4-C5 ACDF on 07/09/2021. She reports persistent UE/LE weakness since that time. She never had PT after her neck surgery however reports that she doesn't have issues at this time related to her neck. Pt reports worsening stress urinary incontinence as well.  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, fishing [employer] (bowel/bladder changes, saddle paresthesia, personal history of cancer, h/o spinal tumors, h/o compression fx, chills/fever, night sweats, nausea, vomiting): Negative  Precautions: None  Weight Bearing Restrictions: No, no lifting restrictions reported by patient or identified in the medical record  [place] with: lives with their spouse Lives in: House/apartment, two levels, bedroom and bathroom on main floor, walk-in shower with built in seat and grab bars, 5 stairs from garage into the house with L railing during ascend Has following equipment at home: Single point cane, Walker - 2 wheeled, and Grab bars  Patient Goals: Improve balance and balance confidence    OBJECTIVE:   Patient Surveys  FOTO: 55, predicted improvement to 53 ABC:  42.5%  Cognition Patient is oriented to person, place, and time.  Recent memory is intact.  Remote memory is intact.  Attention span and concentration are intact.  Expressive speech is intact.  Patient's fund of knowledge is within normal limits for  educational level.    Gross Musculoskeletal Assessment Tremor: None Bulk: Normal Tone: Normal  GAIT: Full gait assessment deferred however self-selected gait speed is decreased  Posture: Full posture assessment deferred. However pt with forward head and rounded shoulders as well as flattened low back (loss of natural lordosis);   AROM: Deferred  LE MMT:  MMT (out of 5) Right 08/04/2022 Left 08/04/2022  Hip flexion 5 5  Hip extension    Hip abduction (seated) 4+ 4+  Hip adduction (seated) 4+ 4+  Hip internal rotation    Hip external rotation    Knee flexion 4+ 4+  Knee extension 5 5  Ankle dorsiflexion 4+ 4+  Ankle plantarflexion    Ankle inversion    Ankle eversion    (* = pain; Blank rows = not tested)  Sensation Deferred  Reflexes Deferred  Cranial Nerves Deferred  Coordination/Cerebellar Deferred   FUNCTIONAL OUTCOME MEASURES   Results Comments  BERG 51/56 Fall risk, in need of intervention  DGI    FGA    TUG 12.4s WNL  5TSTS 11.5s WNL  6 Minute Walk Test    10 Meter Gait Speed    (Blank rows = not tested)   TODAY'S TREATMENT   SUBJECTIVE: Pt reports that she is doing alright today. She has been having some L shoulder pain today after unloading a deep freezer but no resting pain currently. No falls since the last therapy session.  No specific questions currently.   PAIN: Denies  Ther-ex  Sit to stand from regular height chair holding 9# dumbbell with Airex pad under feet 2 x 10; Seated clams with manual resistance x 15; Seated adductor with manual resistance x 15;  Standing hip strengthening with 7.5# AW: Hip flexion marches x 15 BLE; Hamstring curls x 15 BLE; Hip abduction x 15 BLE; Hip  extension x 15 BLE;  Seated LAQ with 7.5# ankle weights (AW) 2 x 15 BLE;   Neuromuscular Re-education  Tandem balance alternating forward LE x 30s each; Tandem gait 15' x 4; Obstacle course in // bars with 6" and 12" hurdles without UE support; Heel/toe raises without UE support 3s hold x 10 each;  Not performed: Hooklying bridges 2 x 10; Supine leg press against therapist's bodyweight 2 x 10 BLE; Seated marches with green tband 2 x 15; Incline standing balance with eyes open/closed x 30s each; Supine SLR with 3# ankle weights (AW) 2 x 10 BLE: Seated hamstring curls with green tband 2 x 10 BLE; Forward lunges with front foot on BOSU (round side up) x 10 BLE; Lateral BOSU lunges (round side up) without UE support x 10 to each side; Standing mini squats x 15; Airex alternating 6" step taps x 10 BLE; Airex semitandem balance with front foot on 6" step alternating forward LE x 30s each; Airex alternating 6" step-ups x 10 leading with each leg; Airex balance beam side stepping in // bars x multiple laps; Airex feet apart balance x 30s; Airex feet apart with horizontal and vertical head turns x 30s each; Airex feet apart eyes closed x 30s; Cross-over side stepping in // bars x multiple laps; Forward 6" step-ups without UE support x 10 leading with each leg; Cross-over step-ups without UE support x 10 leading with each leg; Nautilus resisted gait 50# forward, backward, R lateral, and L lateral x 3 each direction;   PATIENT EDUCATION:  Education details: Pt educated throughout session about proper posture and technique with exercises. Improved exercise technique, movement at  target joints, use of target muscles after min to mod verbal, visual, tactile cues, outcome measures, goals, and plan of care;  Person educated: Patient Education method: Explanation Education comprehension: verbalized understanding and returned demonstration   HOME EXERCISE PROGRAM: Access Code: [email or web address] URL:  [email or web address] Date: 06/09/2022 Prepared by: Roxana Hires  Exercises - Seated Hip Abduction with Resistance  - 1 x daily - 7 x weekly - 2 sets - 10 reps - 3s hold - Seated Hip Adduction Isometrics with Ball  - 1 x daily - 7 x weekly - 2 sets - 10 reps - 3s hold - Seated March with Resistance  - 1 x daily - 7 x weekly - 2 sets - 10 reps - 3s hold - Standing Tandem Balance with Counter Support  - 1 x daily - 7 x weekly - 2 x 30s with each leg forward hold - Single Leg Stance with Support  - 1 x daily - 7 x weekly - 2 x 30s with each leg forward hold   ASSESSMENT:  CLINICAL IMPRESSION: Pt demonstrates excellent motivation during session today. Session today focused on both strengthening and balance exercises. Progressed ankle weights to 7.5# today. No HEP modifications on this date. Plan is to continue therapy at 1x/wk with focus on home program. Pt advised to continue HEP and follow-up as scheduled. She will benefit from skilled PT to address weakness and imbalance and improve overall function.  REHAB POTENTIAL: Fair    CLINICAL DECISION MAKING: Unstable/unpredictable  EVALUATION COMPLEXITY: High   GOALS:  SHORT TERM GOALS: Target date: ACHIEVED  Pt will be independent with HEP in order to improve strength and balance in order to decrease fall risk and improve function at home. Baseline:  Goal status: ACHIEVED   LONG TERM GOALS: Target date: 07/20/2022  Pt will increase FOTO to at least 61 to demonstrate significant improvement in function at home related to balance  Baseline: 05/25/22: 55; 07/05/22: 54 Goal status: ONGOING  2.  Pt will improve BERG by at least 3 points in order to demonstrate clinically significant improvement in balance.   Baseline: 05/25/21: 51/56; 07/05/22: 53/56 Goal status: PARTIALLY MET  3.  Pt will improve ABC by at least 13% in order to demonstrate clinically significant improvement in balance confidence.      Baseline: 05/25/22:  42.5%; 07/05/22: 45% Goal status: PARTIALLY MET  4.  Pt will increase 2MWT by at least 40' in order to demonstrate clinically significant improvement in cardiopulmonary endurance, balance, and community ambulation   Baseline: 05/25/22: To be completed; 06/09/22: 322' without assistive device; 07/05/22: Deferred; 07/07/22: 280' without assistive device (limited by L great toe soreness); Goal status: ONGOING   PLAN: PT FREQUENCY: 1-2x/week  PT DURATION: 8 weeks  PLANNED INTERVENTIONS: Therapeutic exercises, Therapeutic activity, Neuromuscular re-education, Balance training, Gait training, Patient/Family education, Joint manipulation, Joint mobilization, Canalith repositioning, Aquatic Therapy, Dry Needling, Cognitive remediation, Electrical stimulation, Spinal manipulation, Spinal mobilization, Cryotherapy, Moist heat, Traction, Ultrasound, Ionotophoresis 88m/ml Dexamethasone, and Manual therapy  PLAN FOR NEXT SESSION: review/modify HEP as needed, progress balance and strength exercises   JLyndel SafeHuprich PT, DPT, GCS  Carol Carey 08/04/2022, 6:17 PM

## 2022-08-04 ENCOUNTER — Ambulatory Visit: Payer: Medicare Other

## 2022-08-04 DIAGNOSIS — M6281 Muscle weakness (generalized): Secondary | ICD-10-CM

## 2022-08-04 DIAGNOSIS — R2681 Unsteadiness on feet: Secondary | ICD-10-CM

## 2022-08-18 ENCOUNTER — Ambulatory Visit: Payer: Medicare Other | Attending: Neurosurgery

## 2022-08-18 DIAGNOSIS — M6281 Muscle weakness (generalized): Secondary | ICD-10-CM | POA: Insufficient documentation

## 2022-08-18 DIAGNOSIS — R2681 Unsteadiness on feet: Secondary | ICD-10-CM | POA: Insufficient documentation

## 2022-08-18 NOTE — Therapy (Signed)
OUTPATIENT PHYSICAL THERAPY BALANCE TREATMENT   Patient Name: Carol Carey MRN: [id number] DOB:[birth year], 71 y.o., female Today's Date: 08/18/2022   PT End of Session - 08/18/22 1456     Visit Number 14    Number of Visits 33    Date for PT Re-Evaluation 09/15/22    Authorization Type eval: 05/25/22    PT Start Time 1447    PT Stop Time 1530    PT Time Calculation (min) 43 min    Activity Tolerance Patient tolerated treatment well    Behavior During Therapy [facility] for tasks assessed/performed              Past Medical History:  Diagnosis Date   Aortic atherosclerosis ([place])    Arthritis    Ascending aorta dilatation ([place])    a.) measured 3.9cm by TTE on 06/29/21   CAD (coronary artery disease)    Carotid artery stenosis    Colon polyp    COPD (chronic obstructive pulmonary disease) ([place])    Degenerative joint disease    GERD (gastroesophageal reflux disease)    Hepatic steatosis    Hyperlipidemia    Hypertension    Hypothyroidism    Osteoporosis    Pulmonary emphysema ([facility])    Seasonal allergies    Skin cancer    lesion removed from nose   Sleep apnea    not on nocturnal PAP therapy   Spinal stenosis    Valvular insufficiency    a.) TTE on 06/29/2021 --> moderate AR, trivial MR/TR, mild PR   Past Surgical History:  Procedure Laterality Date   ABDOMINAL EXPOSURE N/A 09/30/2020   Procedure: ABDOMINAL EXPOSURE;  Surgeon: Algernon Huxley, MD;  Location: [facility] ORS;  Service: Vascular;  Laterality: N/A;   ABDOMINAL HYSTERECTOMY  1993   ANTERIOR AND POSTERIOR SPINAL FUSION N/A 09/30/2020   Procedure: L5-S1 ANTERIOR LUMBAR INTERBODY FUSION, L3-S1 INSTRUMENTATION;  Surgeon: Meade Maw, MD;  Location: [facility] ORS;  Service: Neurosurgery;  Laterality: N/A;   ANTERIOR CERVICAL DECOMP/DISCECTOMY FUSION N/A 07/09/2021   Procedure: C4-5 ANTERIOR CERVICAL DECOMPRESSION/DISCECTOMY FUSION 1 LEVEL;  Surgeon: Meade Maw, MD;  Location: [facility] ORS;  Service:  Neurosurgery;  Laterality: N/A;  schedule as first case   APPENDECTOMY  1974   BACK SURGERY     BREAST BIOPSY Right 02/15/2021   Q clip, [place] bx, 9:30 5cmfn Usual Ductal Hyperplasia, PASH   BREAST BIOPSY Left 02/15/2021   x clip, [place] bx, 1:00 2cmfn, Usual Ductal Hyperplasia, Apocrine Metaplasia, PASH   BREAST EXCISIONAL BIOPSY Right 1985   Benign cycts removed   CESAREAN SECTION  1993   COLONOSCOPY     LUMBAR FUSION  2010   Dr. March Rummage   LUMBAR FUSION  2013   x 2 Dr. Ellender Hose   TONSILLECTOMY     TUBAL LIGATION  1974   UPPER GI ENDOSCOPY     Patient Active Problem List   Diagnosis Date Noted   Spondylolisthesis 09/30/2020   Chronic left-sided low back pain with left-sided sciatica 04/19/2020   Osteoporosis, post-menopausal 08/13/2019   Vitamin D insufficiency 08/13/2019   Hyperlipidemia 08/02/2019   Degenerative joint disease 08/02/2019   GERD (gastroesophageal reflux disease) 08/02/2019   Hypothyroid 08/02/2019   Osteoporosis 08/02/2019   Sleep apnea 08/02/2019   Tobacco use disorder 07/02/2019   Carotid stenosis 07/02/2019   Lymphadenitis, chronic 06/14/2019   Atherosclerosis of arteries 05/04/2018   Pulmonary emphysema ([place]) 05/04/2018   Hx of adenomatous colonic polyps 05/02/2018   Skin cancer 06/17/2016  Post laminectomy syndrome 04/24/2012   Essential (primary) hypertension [birth year]    PCP: Idelle Crouch, MD  REFERRING PROVIDER: Idelle Crouch, MD  REFERRING DIAGNOSIS: Cervicalgia, s/p arthrodesis  THERAPY DIAG: Muscle weakness (generalized)  Unsteadiness on feet  RATIONALE FOR EVALUATION AND TREATMENT: Rehabilitation  ONSET DATE: 07/09/21  FOLLOW UP APPT WITH PROVIDER: No, pt advised to call if needed   FROM INITIAL EVALUATION  SUBJECTIVE:                                                                                                                                                                                         Chief Complaint:  Imbalance  Pertinent History Pt referred for imbalance. She states that the balance issues started after her L5-S1 ALIF surgery in 2021. She has had multiple lumbar fusions however the balance issues never appeared until the most recent lumbar surgery. She also recently underwent C4-C5 ACDF on 07/09/2021. She reports persistent UE/LE weakness since that time. She never had PT after her neck surgery however reports that she doesn't have issues at this time related to her neck. Pt reports worsening stress urinary incontinence as well.  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, fishing [employer] (bowel/bladder changes, saddle paresthesia, personal history of cancer, h/o spinal tumors, h/o compression fx, chills/fever, night sweats, nausea, vomiting): Negative  Precautions: None  Weight Bearing Restrictions: No, no lifting restrictions reported by patient or identified in the medical record  [place] with: lives with their spouse Lives in: House/apartment, two levels, bedroom and bathroom on main floor, walk-in shower with built in seat and grab bars, 5 stairs from garage into the house with L railing during ascend Has following equipment at home: Single point cane, Walker - 2 wheeled, and Grab bars  Patient Goals: Improve balance and balance confidence    OBJECTIVE:   Patient Surveys  FOTO: 55, predicted improvement to 43 ABC:  42.5%  Cognition Patient is oriented to person, place, and time.  Recent memory is intact.  Remote memory is intact.  Attention span and concentration are intact.  Expressive speech is intact.  Patient's fund of knowledge is within normal limits  for educational level.    Gross Musculoskeletal Assessment Tremor: None Bulk: Normal Tone: Normal  GAIT: Full gait assessment deferred however self-selected gait speed is decreased  Posture: Full posture assessment deferred. However pt with forward head and rounded shoulders as well as flattened low back (loss of natural lordosis);   AROM: Deferred  LE MMT:  MMT (out of 5) Right 08/18/2022 Left 08/18/2022  Hip flexion 5 5  Hip extension    Hip abduction (seated) 4+ 4+  Hip adduction (seated) 4+ 4+  Hip internal rotation    Hip external rotation    Knee flexion 4+ 4+  Knee extension 5 5  Ankle dorsiflexion 4+ 4+  Ankle plantarflexion    Ankle inversion    Ankle eversion    (* = pain; Blank rows = not tested)  Sensation Deferred  Reflexes Deferred  Cranial Nerves Deferred  Coordination/Cerebellar Deferred   FUNCTIONAL OUTCOME MEASURES   Results Comments  BERG 51/56 Fall risk, in need of intervention  DGI    FGA    TUG 12.4s WNL  5TSTS 11.5s WNL  6 Minute Walk Test    10 Meter Gait Speed    (Blank rows = not tested)   TODAY'S TREATMENT   SUBJECTIVE: Pt reports that she is doing alright today. Her son continues to decline and this has been difficult for the family. No specific questions currently.   PAIN: Denies   Ther-ex  Sit to stand from regular height chair holding 2, 6# dumbbells (12# total) x 10; Sit to stand from regular height chair with Airex pad under feet holding 2, 6# dumbbells x 10; Seated clams with blue tband resistance x 20; Seated adductor ball squeeze x 20; Resisted side stepping with blue tband around ankles x multiple laps;   Neuromuscular Re-education  Forward 6" step-ups  without UE support x 10 leading with each leg; Airex alternating 6" step taps x 10 BLE; Airex feet together (FT) eyes open (EO)/eyes closed (EC) balance x 30s each; Airex FT ball passes around body to therapist with return pass on opposite side, therapist adjusting height of return catch; Forward/backward tandem gait 2 x 8' each direction; Airex balance beam side stepping in // bars x multiple laps;   Not performed: Hooklying bridges 2 x 10; Supine leg press against therapist's bodyweight 2 x 10 BLE; Seated marches with green tband 2 x 15; Incline standing balance with eyes open/closed x 30s each; Supine SLR with 3# ankle weights (AW) 2 x 10 BLE: Seated hamstring curls with green tband 2 x 10 BLE; Forward lunges with front foot on BOSU (round side up) x 10 BLE; Lateral BOSU lunges (round side up) without UE support x 10 to each side; Standing mini squats x 15; Airex semitandem balance with front foot on 6" step alternating forward LE x 30s each; Airex alternating 6" step-ups x 10 leading with each leg; Airex feet apart with horizontal and vertical head turns x 30s each; Cross-over side stepping in // bars x multiple laps; Cross-over step-ups without UE support x 10 leading with each leg; Nautilus resisted gait 50# forward, backward, R lateral, and L lateral x 3 each direction; Standing hip strengthening with 7.5# AW: Hip flexion marches x 15 BLE; Hamstring curls x 15 BLE; Hip abduction x 15 BLE; Hip extension x 15 BLE; Seated LAQ with 7.5# ankle weights (AW) 2 x 15 BLE; Obstacle course in // bars with 6" and 12" hurdles without UE support; Heel/toe raises without UE support  3s hold x 10 each;   PATIENT EDUCATION:  Education details: Pt educated throughout session about proper posture and technique with exercises. Improved exercise technique, movement at target joints, use of target muscles after min to mod verbal, visual, tactile cues; Person educated: Patient Education method:  Explanation Education comprehension: verbalized understanding and returned demonstration   HOME EXERCISE PROGRAM: Access Code: [email or web address] URL: [email or web address] Date: 06/09/2022 Prepared by: Roxana Hires  Exercises - Seated Hip Abduction with Resistance  - 1 x daily - 7 x weekly - 2 sets - 10 reps - 3s hold - Seated Hip Adduction Isometrics with Ball  - 1 x daily - 7 x weekly - 2 sets - 10 reps - 3s hold - Seated March with Resistance  - 1 x daily - 7 x weekly - 2 sets - 10 reps - 3s hold - Standing Tandem Balance with Counter Support  - 1 x daily - 7 x weekly - 2 x 30s with each leg forward hold - Single Leg Stance with Support  - 1 x daily - 7 x weekly - 2 x 30s with each leg forward hold   ASSESSMENT:  CLINICAL IMPRESSION: Pt demonstrates excellent motivation during session today. Session today focused on both strengthening and balance exercises. She is demonstrating improving strength and balance. Increased extra weight with sit to stands. No HEP modifications on this date. Plan is to continue therapy at 1x/wk with focus on home program. Pt advised to continue HEP and follow-up as scheduled. She will benefit from skilled PT to address weakness and imbalance and improve overall function.  REHAB POTENTIAL: Fair    CLINICAL DECISION MAKING: Unstable/unpredictable  EVALUATION COMPLEXITY: High   GOALS:  SHORT TERM GOALS: Target date: ACHIEVED  Pt will be independent with HEP in order to improve strength and balance in order to decrease fall risk and improve function at home. Baseline:  Goal status: ACHIEVED   LONG TERM GOALS: Target date: 07/20/2022  Pt will increase FOTO to at least 61 to demonstrate significant improvement in function at home related to balance  Baseline: 05/25/22: 55; 07/05/22: 54 Goal status: ONGOING  2.  Pt will improve BERG by at least 3 points in order to demonstrate clinically significant improvement in balance.   Baseline: 05/25/21:  51/56; 07/05/22: 53/56 Goal status: PARTIALLY MET  3.  Pt will improve ABC by at least 13% in order to demonstrate clinically significant improvement in balance confidence.      Baseline: 05/25/22: 42.5%; 07/05/22: 45% Goal status: PARTIALLY MET  4.  Pt will increase 2MWT by at least 40' in order to demonstrate clinically significant improvement in cardiopulmonary endurance, balance, and community ambulation   Baseline: 05/25/22: To be completed; 06/09/22: 322' without assistive device; 07/05/22: Deferred; 07/07/22: 280' without assistive device (limited by L great toe soreness); Goal status: ONGOING   PLAN: PT FREQUENCY: 1-2x/week  PT DURATION: 8 weeks  PLANNED INTERVENTIONS: Therapeutic exercises, Therapeutic activity, Neuromuscular re-education, Balance training, Gait training, Patient/Family education, Joint manipulation, Joint mobilization, Canalith repositioning, Aquatic Therapy, Dry Needling, Cognitive remediation, Electrical stimulation, Spinal manipulation, Spinal mobilization, Cryotherapy, Moist heat, Traction, Ultrasound, Ionotophoresis 83m/ml Dexamethasone, and Manual therapy  PLAN FOR NEXT SESSION: review/modify HEP as needed, progress balance and strength exercises   JLyndel SafeHuprich PT, DPT, GCS  Refael Fulop 08/18/2022, 4:52 PM

## 2022-08-23 ENCOUNTER — Ambulatory Visit (INDEPENDENT_AMBULATORY_CARE_PROVIDER_SITE_OTHER): Payer: Medicare Other | Admitting: Dermatology

## 2022-08-23 DIAGNOSIS — L820 Inflamed seborrheic keratosis: Secondary | ICD-10-CM | POA: Diagnosis not present

## 2022-08-23 DIAGNOSIS — L918 Other hypertrophic disorders of the skin: Secondary | ICD-10-CM | POA: Diagnosis not present

## 2022-08-23 DIAGNOSIS — D1801 Hemangioma of skin and subcutaneous tissue: Secondary | ICD-10-CM

## 2022-08-23 DIAGNOSIS — L409 Psoriasis, unspecified: Secondary | ICD-10-CM | POA: Diagnosis not present

## 2022-08-23 DIAGNOSIS — K821 Hydrops of gallbladder: Secondary | ICD-10-CM | POA: Diagnosis not present

## 2022-08-23 DIAGNOSIS — Z85828 Personal history of other malignant neoplasm of skin: Secondary | ICD-10-CM

## 2022-08-23 NOTE — Progress Notes (Signed)
New Patient Visit  Subjective  Carol Carey is a 71 y.o. female who presents for the following: New Patient (Initial Visit) (Patient reports a spot at back of scalp for the last 6 - 8 months. Spots at eyebrows. ). Some spots are irritated. Spot in scalp has gone away but still wants it checked.  The patient has spots, moles and lesions to be evaluated, some may be new or changing and the patient has concerns that these could be cancer.   The following portions of the chart were reviewed this encounter and updated as appropriate:       Review of Systems:  No other skin or systemic complaints except as noted in HPI or Assessment and Plan.  Objective  Well appearing patient in no apparent distress; mood and affect are within normal limits.  A focused examination was performed including face, scalp, left elbow, left arm . Relevant physical exam findings are noted in the Assessment and Plan.  left forearm x 1, left glabella x 1, left lateral canthus x 1 (3) Erythematous stuck-on, waxy papule or plaque  Left inferior elbow light pink scaly patch     Assessment & Plan  Inflamed seborrheic keratosis (3) left forearm x 1, left glabella x 1, left lateral canthus x 1  Symptomatic, irritating, patient would like treated.  Probable self-resolved ISK scalp- clear today  Destruction of lesion - left forearm x 1, left glabella x 1, left lateral canthus x 1  Destruction method: cryotherapy   Informed consent: discussed and consent obtained   Lesion destroyed using liquid nitrogen: Yes   Region frozen until ice ball extended beyond lesion: Yes   Outcome: patient tolerated procedure well with no complications   Post-procedure details: wound care instructions given   Additional details:  Prior to procedure, discussed risks of blister formation, small wound, skin dyspigmentation, or rare scar following cryotherapy. Recommend Vaseline ointment to treated areas while healing.    Psoriasis Left inferior elbow  Mild   Psoriasis is a chronic non-curable, but treatable genetic/hereditary disease that may have other systemic features affecting other organ systems such as joints (Psoriatic Arthritis). It is associated with an increased risk of inflammatory bowel disease, heart disease, non-alcoholic fatty liver disease, and depression.    Can continue to use flucinonide cream qd prn flares   Seborrheic Keratoses At scalp and face, including eyebrows - Stuck-on, waxy, tan-brown papules and/or plaques  - Benign-appearing - Discussed benign etiology and prognosis. - Observe - Call for any changes  Hemangiomas - Red papules - Discussed benign nature - Observe - Call for any changes  Acrochordons (Skin Tags) - Fleshy, skin-colored pedunculated papules - Benign appearing.  - Observe. - If desired, they can be removed with an in office procedure that is not covered by insurance. - Please call the clinic if you notice any new or changing lesions.  History of Basal Cell Carcinoma of the Skin Reports history left nasal alar and Mohs surgery oct 2022 [facility] at left forearm  - No evidence of recurrence today - Recommend regular full body skin exams - Recommend daily broad spectrum sunscreen SPF 30+ to sun-exposed areas, reapply every 2 hours as needed.  - Call if any new or changing lesions are noted between office visits   Return if symptoms worsen or fail to improve, for 6 month tbse .  I, Ruthell Rummage, CMA, am acting as scribe for Brendolyn Patty, MD.  Documentation: I have reviewed the above documentation for accuracy  and completeness, and I agree with the above.  Brendolyn Patty MD

## 2022-08-23 NOTE — Patient Instructions (Addendum)
Seborrheic Keratosis  What causes seborrheic keratoses? Seborrheic keratoses are harmless, common skin growths that first appear during adult life.  As time goes by, more growths appear.  Some people may develop a large number of them.  Seborrheic keratoses appear on both covered and uncovered body parts.  They are not caused by sunlight.  The tendency to develop seborrheic keratoses can be inherited.  They vary in color from skin-colored to gray, brown, or even black.  They can be either smooth or have a rough, warty surface.   Seborrheic keratoses are superficial and look as if they were stuck on the skin.  Under the microscope this type of keratosis looks like layers upon layers of skin.  That is why at times the top layer may seem to fall off, but the rest of the growth remains and re-grows.    Treatment Seborrheic keratoses do not need to be treated, but can easily be removed in the office.  Seborrheic keratoses often cause symptoms when they rub on clothing or jewelry.  Lesions can be in the way of shaving.  If they become inflamed, they can cause itching, soreness, or burning.  Removal of a seborrheic keratosis can be accomplished by freezing, burning, or surgery. If any spot bleeds, scabs, or grows rapidly, please return to have it checked, as these can be an indication of a skin cancer.   Cryotherapy Aftercare  Wash gently with soap and water everyday.   Apply Vaseline and Band-Aid daily until healed.    Melanoma ABCDEs  Melanoma is the most dangerous type of skin cancer, and is the leading cause of death from skin disease.  You are more likely to develop melanoma if you: Have light-colored skin, light-colored eyes, or red or blond hair Spend a lot of time in the sun Tan regularly, either outdoors or in a tanning bed Have had blistering sunburns, especially during childhood Have a close family member who has had a melanoma Have atypical moles or large birthmarks  Early detection  of melanoma is key since treatment is typically straightforward and cure rates are extremely high if we catch it early.   The first sign of melanoma is often a change in a mole or a new dark spot.  The ABCDE system is a way of remembering the signs of melanoma.  A for asymmetry:  The two halves do not match. B for border:  The edges of the growth are irregular. C for color:  A mixture of colors are present instead of an even brown color. D for diameter:  Melanomas are usually (but not always) greater than 6mm - the size of a pencil eraser. E for evolution:  The spot keeps changing in size, shape, and color.  Please check your skin once per month between visits. You can use a small mirror in front and a large mirror behind you to keep an eye on the back side or your body.   If you see any new or changing lesions before your next follow-up, please call to schedule a visit.  Please continue daily skin protection including broad spectrum sunscreen SPF 30+ to sun-exposed areas, reapplying every 2 hours as needed when you're outdoors.    Due to recent changes in healthcare laws, you may see results of your pathology and/or laboratory studies on MyChart before the doctors have had a chance to review them. We understand that in some cases there may be results that are confusing or concerning to you. Please understand   that not all results are received at the same time and often the doctors may need to interpret multiple results in order to provide you with the best plan of care or course of treatment. Therefore, we ask that you please give us 2 business days to thoroughly review all your results before contacting the office for clarification. Should we see a critical lab result, you will be contacted sooner.   If You Need Anything After Your Visit  If you have any questions or concerns for your doctor, please call our main line at [phone] and press option 4 to reach your doctor's medical assistant.  If no one answers, please leave a voicemail as directed and we will return your call as soon as possible. Messages left after 4 pm will be answered the following business day.   You may also send us a message via MyChart. We typically respond to MyChart messages within 1-2 business days.  For prescription refills, please ask your pharmacy to contact our office. Our fax number is [phone].  If you have an urgent issue when the clinic is closed that cannot wait until the next business day, you can page your doctor at the number below.    Please note that while we do our best to be available for urgent issues outside of office hours, we are not available 24/7.   If you have an urgent issue and are unable to reach us, you may choose to seek medical care at your doctor's office, retail clinic, urgent care center, or emergency room.  If you have a medical emergency, please immediately call 911 or go to the emergency department.  Pager Numbers  - Dr. Kowalski: [phone]  - Dr. Moye: [phone]  - Dr. Stewart: [phone]  In the event of inclement weather, please call our main line at [phone] for an update on the status of any delays or closures.  Dermatology Medication Tips: Please keep the boxes that topical medications come in in order to help keep track of the instructions about where and how to use these. Pharmacies typically print the medication instructions only on the boxes and not directly on the medication tubes.   If your medication is too expensive, please contact our office at [phone] option 4 or send us a message through MyChart.   We are unable to tell what your co-pay for medications will be in advance as this is different depending on your insurance coverage. However, we may be able to find a substitute medication at lower cost or fill out paperwork to get insurance to cover a needed medication.   If a prior authorization is required to get your medication  covered by your insurance company, please allow us 1-2 business days to complete this process.  Drug prices often vary depending on where the prescription is filled and some pharmacies may offer cheaper prices.  The website www.goodrx.com contains coupons for medications through different pharmacies. The prices here do not account for what the cost may be with help from insurance (it may be cheaper with your insurance), but the website can give you the price if you did not use any insurance.  - You can print the associated coupon and take it with your prescription to the pharmacy.  - You may also stop by our office during regular business hours and pick up a GoodRx coupon card.  - If you need your prescription sent electronically to a different pharmacy, notify our office through [place] MyChart or by   by phone at [phone] option 4.     Si Usted Necesita Algo Despus de Su Visita  Tambin puede enviarnos un mensaje a travs de Pharmacist, community. Por lo general respondemos a los mensajes de MyChart en el transcurso de 1 a 2 das hbiles.  Para renovar recetas, por favor pida a su farmacia que se ponga en contacto con nuestra oficina. Harland Dingwall de fax es [place] [phone].  Si tiene un asunto urgente cuando la clnica est cerrada y que no puede esperar hasta el siguiente da hbil, puede llamar/localizar a su doctor(a) al nmero que aparece a continuacin.   Por favor, tenga en cuenta que aunque hacemos todo lo posible para estar disponibles para asuntos urgentes fuera del horario de [place], no estamos disponibles las 24 horas del da, los 7 das de la [place].   Si tiene un problema urgente y no puede comunicarse con nosotros, puede optar por buscar atencin mdica  en el consultorio de su doctor(a), en una clnica privada, en un centro de atencin urgente o en una sala de emergencias.  Si tiene Engineering geologist, por favor llame inmediatamente al 911 o vaya a la sala de  emergencias.  Nmeros de bper  - Dr. Nehemiah Massed: [phone]  - Dra. Moye: [phone]  - Dra. Nicole Kindred: [phone]  En caso de inclemencias del [place], por favor llame a Johnsie Kindred principal al [phone] para una actualizacin sobre el [place] de cualquier retraso o cierre.  Consejos para la medicacin en dermatologa: Por favor, guarde las cajas en las que vienen los medicamentos de uso tpico para ayudarle a seguir las instrucciones sobre dnde y cmo usarlos. Las farmacias generalmente imprimen las instrucciones del medicamento slo en las cajas y no directamente en los tubos del [place].   Si su medicamento es muy caro, por favor, pngase en contacto con Zigmund Daniel llamando al [phone] y presione la opcin 4 o envenos un mensaje a travs de Pharmacist, community.   No podemos decirle cul ser su copago por los medicamentos por adelantado ya que esto es diferente dependiendo de la cobertura de su seguro. Sin embargo, es posible que podamos encontrar un medicamento sustituto a Electrical engineer un formulario para que el seguro cubra el medicamento que se considera necesario.   Si se requiere una autorizacin previa para que su compaa de seguros [place] su medicamento, por favor permtanos de 1 a 2 das hbiles para completar este proceso.  Los precios de los medicamentos varan con frecuencia dependiendo del Environmental consultant de dnde se surte la receta y alguna farmacias pueden ofrecer precios ms baratos.  El sitio web www.goodrx.com tiene cupones para medicamentos de Airline pilot. Los precios aqu no tienen en cuenta lo que podra costar con la ayuda del seguro (puede ser ms barato con su seguro), pero el sitio web puede darle el precio si no utiliz Research scientist (physical sciences).  - Puede imprimir el cupn correspondiente y llevarlo con su receta a la farmacia.  - Tambin puede pasar por nuestra oficina durante el horario de atencin regular y Charity fundraiser una tarjeta de cupones de GoodRx.  - Si  necesita que su receta se enve electrnicamente a una farmacia diferente, informe a nuestra oficina a travs de MyChart de [place] o por telfono llamando al [phone] y presione la opcin 4.

## 2022-10-03 ENCOUNTER — Encounter (INDEPENDENT_AMBULATORY_CARE_PROVIDER_SITE_OTHER): Payer: Self-pay

## 2022-11-16 ENCOUNTER — Encounter: Payer: Self-pay | Admitting: Ophthalmology

## 2022-11-21 ENCOUNTER — Ambulatory Visit (INDEPENDENT_AMBULATORY_CARE_PROVIDER_SITE_OTHER): Payer: Medicare Other | Admitting: Podiatry

## 2022-11-21 ENCOUNTER — Encounter: Payer: Self-pay | Admitting: Podiatry

## 2022-11-21 DIAGNOSIS — M79676 Pain in unspecified toe(s): Secondary | ICD-10-CM | POA: Diagnosis not present

## 2022-11-21 DIAGNOSIS — B351 Tinea unguium: Secondary | ICD-10-CM

## 2022-11-21 NOTE — Progress Notes (Signed)
She presents today chief complaint of painful elongated toenails.  Objective: Toenails are long thick yellow dystrophic onychomycotic painful palpation as well as debridement.  Pulses are palpable.  Assessment: Pain limb secondary onychomycosis.  Plan: Debridement toenails 1 through 5 bilateral covered service secondary to pain.

## 2022-11-21 NOTE — Discharge Instructions (Signed)

## 2022-11-23 ENCOUNTER — Other Ambulatory Visit: Payer: Self-pay

## 2022-11-23 ENCOUNTER — Encounter
Admission: RE | Disposition: A | Discharge status: Home / Self Care | Payer: Self-pay | Source: Home / Self Care | Attending: Ophthalmology

## 2022-11-23 ENCOUNTER — Ambulatory Visit: Payer: Medicare Other | Admitting: Anesthesiology

## 2022-11-23 ENCOUNTER — Ambulatory Visit
Admission: RE | Admit: 2022-11-23 | Discharge: 2022-11-23 | Disposition: A | Discharge status: Home / Self Care | Payer: Medicare Other | Attending: Ophthalmology | Admitting: Ophthalmology

## 2022-11-23 DIAGNOSIS — K219 Gastro-esophageal reflux disease without esophagitis: Secondary | ICD-10-CM | POA: Insufficient documentation

## 2022-11-23 DIAGNOSIS — H2512 Age-related nuclear cataract, left eye: Secondary | ICD-10-CM | POA: Diagnosis present

## 2022-11-23 DIAGNOSIS — G4730 Sleep apnea, unspecified: Secondary | ICD-10-CM | POA: Insufficient documentation

## 2022-11-23 DIAGNOSIS — Z87891 Personal history of nicotine dependence: Secondary | ICD-10-CM | POA: Insufficient documentation

## 2022-11-23 DIAGNOSIS — I10 Essential (primary) hypertension: Secondary | ICD-10-CM | POA: Diagnosis not present

## 2022-11-23 DIAGNOSIS — E039 Hypothyroidism, unspecified: Secondary | ICD-10-CM | POA: Insufficient documentation

## 2022-11-23 HISTORY — PX: CATARACT EXTRACTION W/PHACO: SHX586

## 2022-11-23 SURGERY — PHACOEMULSIFICATION, CATARACT, WITH IOL INSERTION
Anesthesia: Monitor Anesthesia Care | Site: Eye | Laterality: Left

## 2022-11-23 MED ORDER — TETRACAINE HCL 0.5 % OP SOLN
1.0000 [drp] | OPHTHALMIC | Status: DC | PRN
  Administered 2022-11-23 (×3): 1 [drp] via OPHTHALMIC

## 2022-11-23 MED ORDER — FENTANYL CITRATE (PF) 100 MCG/2ML IJ SOLN
INTRAMUSCULAR | Status: DC | PRN
  Administered 2022-11-23: 50 ug via INTRAVENOUS

## 2022-11-23 MED ORDER — SIGHTPATH DOSE#1 BSS IO SOLN
INTRAOCULAR | Status: DC | PRN
  Administered 2022-11-23: 62 mL via OPHTHALMIC

## 2022-11-23 MED ORDER — SIGHTPATH DOSE#1 BSS IO SOLN
INTRAOCULAR | Status: DC | PRN
  Administered 2022-11-23: 15 mL

## 2022-11-23 MED ORDER — SIGHTPATH DOSE#1 BSS IO SOLN
INTRAOCULAR | Status: DC | PRN
  Administered 2022-11-23: 1 mL

## 2022-11-23 MED ORDER — MOXIFLOXACIN HCL 0.5 % OP SOLN
OPHTHALMIC | Status: DC | PRN
  Administered 2022-11-23: .2 mL via OPHTHALMIC

## 2022-11-23 MED ORDER — SIGHTPATH DOSE#1 NA HYALUR & NA CHOND-NA HYALUR IO KIT
PACK | INTRAOCULAR | Status: DC | PRN
  Administered 2022-11-23: 1 via OPHTHALMIC

## 2022-11-23 MED ORDER — ARMC OPHTHALMIC DILATING DROPS
1.0000 | OPHTHALMIC | Status: DC | PRN
  Administered 2022-11-23 (×3): 1 via OPHTHALMIC

## 2022-11-23 MED ORDER — BRIMONIDINE TARTRATE-TIMOLOL 0.2-0.5 % OP SOLN
OPHTHALMIC | Status: DC | PRN
  Administered 2022-11-23: 1 [drp] via OPHTHALMIC

## 2022-11-23 MED ORDER — MIDAZOLAM HCL 2 MG/2ML IJ SOLN
INTRAMUSCULAR | Status: DC | PRN
  Administered 2022-11-23: 2 mg via INTRAVENOUS

## 2022-11-23 SURGICAL SUPPLY — 20 items
CANNULA ANT/CHMB 27G (MISCELLANEOUS) IMPLANT
CANNULA ANT/CHMB 27GA (MISCELLANEOUS) IMPLANT
CATARACT SUITE SIGHTPATH (MISCELLANEOUS) ×1 IMPLANT
FEE CATARACT SUITE SIGHTPATH (MISCELLANEOUS) ×1 IMPLANT
GLOVE SRG 8 PF TXTR STRL LF DI (GLOVE) ×1 IMPLANT
GLOVE SURG ENC TEXT LTX SZ7.5 (GLOVE) ×1 IMPLANT
GLOVE SURG GAMMEX PI TX LF 7.5 (GLOVE) IMPLANT
GLOVE SURG UNDER POLY LF SZ8 (GLOVE) ×1
LENS IOL TECNIS EYHANCE 28.0 (Intraocular Lens) IMPLANT
NDL FILTER BLUNT 18X1 1/2 (NEEDLE) ×1 IMPLANT
NDL RETROBULBAR .5 NSTRL (NEEDLE) IMPLANT
NEEDLE FILTER BLUNT 18X1 1/2 (NEEDLE) ×1 IMPLANT
PACK VIT ANT 23G (MISCELLANEOUS) IMPLANT
RING MALYGIN 7.0 (MISCELLANEOUS) IMPLANT
SUT ETHILON 10-0 CS-B-6CS-B-6 (SUTURE)
SUT VICRYL  9 0 (SUTURE)
SUT VICRYL 9 0 (SUTURE) IMPLANT
SUTURE EHLN 10-0 CS-B-6CS-B-6 (SUTURE) IMPLANT
SYR 3ML LL SCALE MARK (SYRINGE) ×1 IMPLANT
WATER STERILE IRR 250ML POUR (IV SOLUTION) ×1 IMPLANT

## 2022-11-23 NOTE — H&P (Signed)
[facility]   Primary Care Physician:  Idelle Crouch, MD Ophthalmologist: Dr. Leandrew Koyanagi  Pre-Procedure History & Physical: HPI:  Carol Carey is a 71 y.o. female here for ophthalmic surgery.   Past Medical History:  Diagnosis Date   Aortic atherosclerosis ([place])    Arthritis    Ascending aorta dilatation (HCC)    a.) measured 3.9cm by TTE on 06/29/21   CAD (coronary artery disease)    Carotid artery stenosis    Colon polyp    COPD (chronic obstructive pulmonary disease) (HCC)    Degenerative joint disease    GERD (gastroesophageal reflux disease)    Hepatic steatosis    Hyperlipidemia    Hypertension    Hypothyroidism    Melanoma in situ ([place]) 08/13/2021   L forearm   Osteoporosis    Pulmonary emphysema (HCC)    Seasonal allergies    Skin cancer    lesion removed from nose   Sleep apnea    not on nocturnal PAP therapy   Spinal stenosis    Valvular insufficiency    a.) TTE on 06/29/2021 --> moderate AR, trivial MR/TR, mild PR    Past Surgical History:  Procedure Laterality Date   ABDOMINAL EXPOSURE N/A 09/30/2020   Procedure: ABDOMINAL EXPOSURE;  Surgeon: Algernon Huxley, MD;  Location: [facility] ORS;  Service: Vascular;  Laterality: N/A;   [place] N/A 09/30/2020   Procedure: L5-S1 ANTERIOR LUMBAR INTERBODY FUSION, L3-S1 INSTRUMENTATION;  Surgeon: Meade Maw, MD;  Location: [facility] ORS;  Service: Neurosurgery;  Laterality: N/A;   ANTERIOR CERVICAL DECOMP/DISCECTOMY FUSION N/A 07/09/2021   Procedure: C4-5 ANTERIOR CERVICAL DECOMPRESSION/DISCECTOMY FUSION 1 LEVEL;  Surgeon: Meade Maw, MD;  Location: [facility] ORS;  Service: Neurosurgery;  Laterality: N/A;  schedule as first case   APPENDECTOMY  1974   BACK SURGERY     BREAST BIOPSY Right 02/15/2021   Q clip, [place] bx, 9:30 5cmfn Usual Ductal Hyperplasia, PASH   BREAST BIOPSY Left 02/15/2021   x clip, [place] bx, 1:00 2cmfn, Usual Ductal  Hyperplasia, Apocrine Metaplasia, PASH   BREAST EXCISIONAL BIOPSY Right 1985   Benign cycts removed   CESAREAN SECTION  1993   COLONOSCOPY     LUMBAR FUSION  2010   Dr. March Rummage   LUMBAR FUSION  2013   x 2 Dr. Ellender Hose   TONSILLECTOMY     TUBAL LIGATION  1974   UPPER GI ENDOSCOPY      Prior to Admission medications   Medication Sig Start Date End Date Taking? Authorizing Provider  atenolol (TENORMIN) 50 MG tablet Take 50 mg by mouth at bedtime.    Yes [provider]  atorvastatin (LIPITOR) 20 MG tablet Take 20 mg by mouth daily.  05/09/19  Yes [provider]  CALCIUM-VITAMIN D PO Take 1 tablet by mouth daily.   Yes [provider]  cyclobenzaprine (FLEXERIL) 10 MG tablet Take 10 mg by mouth 3 (three) times daily as needed for muscle spasms.   Yes [provider]  esomeprazole (NEXIUM) 20 MG capsule Take 20 mg by mouth at bedtime. 01/30/19  Yes [provider]  fluticasone (FLONASE) 50 MCG/ACT nasal spray Place 1 spray into the nose daily as needed for allergies. 12/08/20  Yes [provider]  gabapentin (NEURONTIN) 300 MG capsule Take 300 mg by mouth 3 (three) times daily. 06/29/22  Yes [provider]  hydrochlorothiazide (HYDRODIURIL) 25 MG tablet Take 25 mg by  mouth daily. 01/29/19  Yes [provider]  isosorbide mononitrate (IMDUR) 30 MG 24 hr tablet Take 30 mg by mouth daily. 06/16/21  Yes [provider]  levothyroxine (SYNTHROID) 100 MCG tablet Take 100 mcg by mouth at bedtime. 10/22/20  Yes [provider]  losartan (COZAAR) 100 MG tablet Take 100 mg by mouth daily.  05/27/19  Yes [provider]  Potassium 99 MG TABS Take 99 mg by mouth daily.   Yes [provider]  hydrocortisone 2.5 % cream Apply 1 application topically 2 (two) times daily as needed (hemorrhoids).    [provider]  mupirocin ointment (BACTROBAN) 2 % Apply topically daily. 06/02/22   [provider]  NEOMYCIN-POLYMYXIN-HYDROCORTISONE (CORTISPORIN) 1 % SOLN OTIC solution Apply 1-2 drops to toe BID after soaking 06/23/22   Hyatt, Max T, DPM    Allergies as of 10/20/2022 - Review Complete 08/23/2022  Allergen Reaction Noted   Demerol [meperidine hcl] Nausea And Vomiting 04/03/2018   Latex Rash 04/03/2018   Lisinopril Cough 02/13/2018   Penicillins Rash 04/03/2018   Sulfa antibiotics Rash 04/03/2018    Family History  Problem Relation Age of Onset   Hypertension Mother    Heart attack Father    Lung cancer Brother    Breast cancer Neg Hx     Social History   Socioeconomic History   Marital status: Married    Spouse name: Not on file   Number of children: Not on file   Years of education: Not on file   Highest education level: Not on file  Occupational History   Not on file  Tobacco Use   Smoking status: Former    Packs/day: 1.00    Years: 48.00    Total pack years: 48.00    Types: Cigarettes    Quit date: 02/14/2020    Years since quitting: 2.7   Smokeless tobacco: Never  Vaping Use   Vaping Use: Never used  Substance and Sexual Activity   Alcohol use: Yes    Alcohol/week: 14.0 standard drinks of alcohol    Types: 14 Glasses of wine per week   Drug use: Not Currently    Types: Marijuana    Comment: 2018 tried using   Sexual activity: Not on file  Other Topics Concern   Not on file  Social History Narrative   Not on file   Social Determinants of Health   Financial Resource Strain: Not on file  Food Insecurity: Not on file  Transportation Needs: Not on file  Physical Activity: Not on file  Stress: Not on file  Social Connections: Not on file  Intimate Partner Violence: Not on file    Review of Systems: See HPI, otherwise negative ROS  Physical Exam: BP (!) 181/83   Pulse 70   Temp 98.3 F (36.8 C) (Temporal)   Resp 20   Wt 77.6 kg   SpO2 100%   BMI 34.54 kg/m  General:   Alert,  pleasant and cooperative in NAD Head:  Normocephalic and  atraumatic. Lungs:  Clear to auscultation.    Heart:  Regular rate and rhythm.   Impression/Plan: Carol Carey is here for ophthalmic surgery.  Risks, benefits, limitations, and alternatives regarding ophthalmic surgery have been reviewed with the patient.  Questions have been answered.  All parties agreeable.   Leandrew Koyanagi, MD  11/23/2022, 10:24 AM

## 2022-11-23 NOTE — Anesthesia Preprocedure Evaluation (Addendum)
Anesthesia Evaluation  Patient identified by MRN, date of birth, ID band Patient awake    Reviewed: Allergy & Precautions, NPO status , Patient's Chart, lab work & pertinent test results  History of Anesthesia Complications (+) history of anesthetic complications (no PONV with previous back surgery)  Airway Mallampati: III  TM Distance: >3 FB Neck ROM: Full    Dental  (+) Teeth Intact, Caps   Pulmonary shortness of breath and with exertion, sleep apnea , COPD, Patient abstained from smoking.Not current smoker, former smoker COPD in chart, patient says she is asymptomatic and not on inhalers   Pulmonary exam normal breath sounds clear to auscultation       Cardiovascular Exercise Tolerance: Poor METShypertension, (-) CAD and (-) Past MI (-) dysrhythmias + Valvular Problems/Murmurs  Rhythm:Regular Rate:Normal - Systolic murmurs Non-occlusive b/l carotid disease, followed by vascular  TTE on 06/29/2021 --> moderate AR, trivial MR/TR, mild PR   Neuro/Psych  Neuromuscular disease (Neck pain and sciatica)  negative psych ROS   GI/Hepatic ,GERD  Controlled,,(+)     (-) substance abuse    Endo/Other  neg diabetesHypothyroidism    Renal/GU negative Renal ROS     Musculoskeletal  (+) Arthritis , Osteoarthritis,    Abdominal  (+) + obese  Peds  Hematology   Anesthesia Other Findings Past Medical History: No date: Arthritis No date: Carotid artery stenosis No date: Colon polyp No date: Degenerative joint disease No date: GERD (gastroesophageal reflux disease) No date: Hyperlipidemia No date: Hypertension No date: Hypothyroidism No date: Osteoporosis No date: Pulmonary emphysema (HCC) No date: Skin cancer     Comment:  lesion removed from nose No date: Spinal stenosis No date: Thyroid disease  Reproductive/Obstetrics                              Anesthesia Physical Anesthesia Plan  ASA:  III  Anesthesia Plan: MAC   Post-op Pain Management:    Induction: Intravenous  PONV Risk Score and Plan: 4 or greater and 3 and TIVA and Midazolam  Airway Management Planned: Natural Airway  Additional Equipment:   Intra-op Plan:   Post-operative Plan:   Informed Consent: I have reviewed the patients History and Physical, chart, labs and discussed the procedure including the risks, benefits and alternatives for the proposed anesthesia with the patient or authorized representative who has indicated his/her understanding and acceptance.       Plan Discussed with: CRNA and Anesthesiologist  Anesthesia Plan Comments: (  )         Anesthesia Quick Evaluation

## 2022-11-23 NOTE — Transfer of Care (Signed)
Immediate Anesthesia Transfer of Care Note  Patient: Carol Carey  Procedure(s) Performed: CATARACT EXTRACTION PHACO AND INTRAOCULAR LENS PLACEMENT (IOC) LEFT MALYUGIN (Left: Eye)  Patient Location: PACU  Anesthesia Type: MAC  Level of Consciousness: awake, alert  and patient cooperative  Airway and Oxygen Therapy: Patient Spontanous Breathing and Patient connected to supplemental oxygen  Post-op Assessment: Post-op Vital signs reviewed, Patient's Cardiovascular Status Stable, Respiratory Function Stable, Patent Airway and No signs of Nausea or vomiting  Post-op Vital Signs: Reviewed and stable  Complications: No notable events documented.

## 2022-11-23 NOTE — Op Note (Signed)
  OPERATIVE NOTE  Carol Carey [id number] 11/23/2022  PREOPERATIVE DIAGNOSIS:   Nuclear sclerotic cataract left eye with miotic pupil      H25.12   POSTOPERATIVE DIAGNOSIS:   Nuclear sclerotic cataract left eye with miotic pupil.     PROCEDURE:  Phacoemulsification with posterior chamber intraocular lens implantation of the left eye which required pupil stretching with the Malyugin pupil expansion device  Ultrasound time: Procedure(s) with comments: CATARACT EXTRACTION PHACO AND INTRAOCULAR LENS PLACEMENT (IOC) LEFT MALYUGIN (Left) - 11.46 1:16.1  LENS:   Implant Name Type Inv. Item Serial No. Manufacturer Lot No. LRB No. Used Action  LENS IOL TECNIS EYHANCE 28.0 - [id number] Intraocular Lens LENS IOL TECNIS EYHANCE 28.0 [id number] [employer]  Left 1 Implanted     SURGEON:  Wyonia Hough, MD   ANESTHESIA: Topical with tetracaine drops and 2% Xylocaine jelly, augmented with 1% preservative-free intracameral lidocaine.   COMPLICATIONS:  None.   DESCRIPTION OF PROCEDURE:  The patient was identified in the holding room and transported to the operating room and placed in the supine position under the operating microscope.  The left eye was identified as the operative eye and it was prepped and draped in the usual sterile ophthalmic fashion.   A 1 millimeter clear-corneal paracentesis was made at the 1:30 position.  The anterior chamber was filled with Viscoat viscoelastic.  0.5 ml of preservative-free 1% lidocaine was injected into the anterior chamber.  A 2.4 millimeter keratome was used to make a near-clear corneal incision at the 10:30 position.  A Malyugin pupil expander was then placed through the main incision and into the anterior chamber of the eye.  The edge of the iris was secured on the lip of the pupil expander and it was released, thereby expanding the pupil to approximately 7 millimeters for completion of the cataract surgery.  Additional Viscoat was placed in the  anterior chamber.  A cystotome and capsulorrhexis forceps were used to make a curvilinear capsulorrhexis.   Balanced salt solution was used to hydrodissect and hydrodelineate the lens nucleus.   Phacoemulsification was used in stop and chop fashion to remove the lens, nucleus and epinucleus.  The remaining cortex was aspirated using the irrigation aspiration handpiece.  Additional Provisc was placed into the eye to distend the capsular bag for lens placement.  A lens was then injected into the capsular bag.  The pupil expanding ring was removed using a Kuglen hook and insertion device. The remaining viscoelastic was aspirated from the capsular bag and the anterior chamber.  The anterior chamber was filled with balanced salt solution to inflate to a physiologic pressure.   Wounds were hydrated with balanced salt solution.  The anterior chamber was inflated to a physiologic pressure with balanced salt solution.  No wound leaks were noted. Vigamox 0.2 ml of a '1mg'$  per ml solution was injected into the anterior chamber for a dose of 0.2 mg of intracameral antibiotic at the completion of the case.   Timolol and Brimonidine drops were applied to the eye.  The patient was taken to the recovery room in stable condition without complications of anesthesia or surgery.  [name] 11/23/2022, 10:53 AM

## 2022-11-23 NOTE — Anesthesia Postprocedure Evaluation (Signed)
Anesthesia Post Note  Patient: Carol Carey  Procedure(s) Performed: CATARACT EXTRACTION PHACO AND INTRAOCULAR LENS PLACEMENT (IOC) LEFT MALYUGIN (Left: Eye)  Patient location during evaluation: PACU Anesthesia Type: MAC Level of consciousness: awake and alert Pain management: pain level controlled Vital Signs Assessment: post-procedure vital signs reviewed and stable Respiratory status: spontaneous breathing, nonlabored ventilation and respiratory function stable Cardiovascular status: blood pressure returned to baseline and stable Postop Assessment: no apparent nausea or vomiting Anesthetic complications: no   No notable events documented.   Last Vitals:  Vitals:   11/23/22 1054 11/23/22 1100  BP: (!) 144/79 (!) 144/74  Pulse: 67 70  Resp: 19 14  Temp: (!) 36.4 C (!) 36.4 C  SpO2: 97% 96%    Last Pain:  Vitals:   11/23/22 1100  TempSrc:   PainSc: 0-No pain                 [place] Ouch

## 2022-11-24 ENCOUNTER — Encounter: Payer: Self-pay | Admitting: Ophthalmology

## 2022-12-12 ENCOUNTER — Encounter: Payer: Self-pay | Admitting: Ophthalmology

## 2022-12-19 NOTE — Discharge Instructions (Signed)
   Cataract Surgery, Care After ? ?This sheet gives you information about how to care for yourself after your surgery.  Your ophthalmologist may also give you more specific instructions.  If you have problems or questions, contact your doctor at [facility], [phone]. ? ?What can I expect after the surgery? ?It is common to have: ?Itching ?Foreign body sensation (feels like a grain of sand in the eye) ?Watery discharge (excess tearing) ?Sensitivity to light and touch ?Bruising in or around the eye ?Mild blurred vision ? ?Follow these instructions at home: ?Do not touch or rub your eyes. ?You may be told to wear a protective shield or sunglasses to protect your eyes. ?Do not put a contact lens in the operative eye unless your doctor approves. ?Keep the lids and face clean and dry. ?Do not allow water to hit you directly in the face while showering. ?Keep soap and shampoo out of your eyes. ?Do not use eye makeup for 1 week. ? ?Check your eye every day for signs of infection.  Watch for: ?Redness, swelling, or pain. ?Fluid, blood or pus. ?Worsening vision. ?Worsening sensitivity to light or touch. ? ?Activity: ?During the first day, avoid bending over and reading.  You may resume reading and bending the next day. ?Do not drive or use heavy machinery for at least 24 hours. ?Avoid strenuous activities for 1 week.  Activities such as walking, treadmill, exercise bike, and climbing stairs are okay. ?Do not lift heavy (>20 pound) objects for 1 week. ?Do not do yardwork, gardening, or dirty housework (mopping, cleaning bathrooms, vacuuming, etc.) for 1 week. ?Do not swim or use a hot tub for 2 weeks. ?Ask your doctor when you can return to work. ? ?General Instructions: ?Take or apply prescription and over-the-counter medicines as directed by your doctor, including eyedrops and ointments. ?Resume medications discontinued prior to surgery, unless told otherwise by your doctor. ?Keep all follow up appointments as  scheduled. ? ?Contact a health care provider if: ?You have increased bruising around your eye. ?You have pain that is not helped with medication. ?You have a fever. ?You have fluid, pus, or blood coming from your eye or incision. ?Your sensitivity to light gets worse. ?You have spots (floaters) of flashing lights in your vision. ?You have nausea or vomiting. ? ?Go to the nearest emergency room or call 911 if: ?You have sudden loss of vision. ?You have severe, worsening eye pain. ? ?

## 2022-12-20 ENCOUNTER — Other Ambulatory Visit: Payer: Self-pay

## 2022-12-20 ENCOUNTER — Ambulatory Visit
Admission: RE | Admit: 2022-12-20 | Discharge: 2022-12-20 | Disposition: A | Discharge status: Home / Self Care | Payer: Self-pay | Source: Ambulatory Visit | Attending: Orthopedic Surgery | Admitting: Orthopedic Surgery

## 2022-12-20 DIAGNOSIS — Z049 Encounter for examination and observation for unspecified reason: Secondary | ICD-10-CM

## 2022-12-21 ENCOUNTER — Encounter
Admission: RE | Disposition: A | Discharge status: Home / Self Care | Payer: Self-pay | Source: Home / Self Care | Attending: Ophthalmology

## 2022-12-21 ENCOUNTER — Encounter: Payer: Self-pay | Admitting: Ophthalmology

## 2022-12-21 ENCOUNTER — Other Ambulatory Visit: Payer: Self-pay

## 2022-12-21 ENCOUNTER — Ambulatory Visit: Payer: Medicare Other | Admitting: Anesthesiology

## 2022-12-21 ENCOUNTER — Ambulatory Visit
Admission: RE | Admit: 2022-12-21 | Discharge: 2022-12-21 | Disposition: A | Discharge status: Home / Self Care | Payer: Medicare Other | Attending: Ophthalmology | Admitting: Ophthalmology

## 2022-12-21 DIAGNOSIS — E039 Hypothyroidism, unspecified: Secondary | ICD-10-CM | POA: Diagnosis not present

## 2022-12-21 DIAGNOSIS — K219 Gastro-esophageal reflux disease without esophagitis: Secondary | ICD-10-CM | POA: Diagnosis not present

## 2022-12-21 DIAGNOSIS — H2511 Age-related nuclear cataract, right eye: Secondary | ICD-10-CM | POA: Diagnosis not present

## 2022-12-21 DIAGNOSIS — I2510 Atherosclerotic heart disease of native coronary artery without angina pectoris: Secondary | ICD-10-CM | POA: Insufficient documentation

## 2022-12-21 DIAGNOSIS — I10 Essential (primary) hypertension: Secondary | ICD-10-CM | POA: Insufficient documentation

## 2022-12-21 DIAGNOSIS — Z87891 Personal history of nicotine dependence: Secondary | ICD-10-CM | POA: Insufficient documentation

## 2022-12-21 DIAGNOSIS — H5703 Miosis: Secondary | ICD-10-CM | POA: Diagnosis not present

## 2022-12-21 DIAGNOSIS — G4730 Sleep apnea, unspecified: Secondary | ICD-10-CM | POA: Diagnosis not present

## 2022-12-21 DIAGNOSIS — J449 Chronic obstructive pulmonary disease, unspecified: Secondary | ICD-10-CM | POA: Insufficient documentation

## 2022-12-21 HISTORY — PX: CATARACT EXTRACTION W/PHACO: SHX586

## 2022-12-21 SURGERY — PHACOEMULSIFICATION, CATARACT, WITH IOL INSERTION
Anesthesia: Monitor Anesthesia Care | Site: Eye | Laterality: Right

## 2022-12-21 MED ORDER — SIGHTPATH DOSE#1 NA HYALUR & NA CHOND-NA HYALUR IO KIT
PACK | INTRAOCULAR | Status: DC | PRN
  Administered 2022-12-21: 1 via OPHTHALMIC

## 2022-12-21 MED ORDER — ARMC OPHTHALMIC DILATING DROPS
1.0000 | OPHTHALMIC | Status: DC | PRN
  Administered 2022-12-21 (×3): 1 via OPHTHALMIC

## 2022-12-21 MED ORDER — TETRACAINE HCL 0.5 % OP SOLN
1.0000 [drp] | OPHTHALMIC | Status: DC | PRN
  Administered 2022-12-21 (×3): 1 [drp] via OPHTHALMIC

## 2022-12-21 MED ORDER — SIGHTPATH DOSE#1 BSS IO SOLN
INTRAOCULAR | Status: DC | PRN
  Administered 2022-12-21: 73 mL via OPHTHALMIC

## 2022-12-21 MED ORDER — FENTANYL CITRATE (PF) 100 MCG/2ML IJ SOLN
INTRAMUSCULAR | Status: DC | PRN
  Administered 2022-12-21 (×2): 50 ug via INTRAVENOUS

## 2022-12-21 MED ORDER — MOXIFLOXACIN HCL 0.5 % OP SOLN
OPHTHALMIC | Status: DC | PRN
  Administered 2022-12-21: .2 mL via OPHTHALMIC

## 2022-12-21 MED ORDER — MIDAZOLAM HCL 2 MG/2ML IJ SOLN
INTRAMUSCULAR | Status: DC | PRN
  Administered 2022-12-21 (×2): 1 mg via INTRAVENOUS

## 2022-12-21 MED ORDER — BRIMONIDINE TARTRATE-TIMOLOL 0.2-0.5 % OP SOLN
OPHTHALMIC | Status: DC | PRN
  Administered 2022-12-21: 1 [drp] via OPHTHALMIC

## 2022-12-21 MED ORDER — SIGHTPATH DOSE#1 BSS IO SOLN
INTRAOCULAR | Status: DC | PRN
  Administered 2022-12-21: 15 mL via INTRAOCULAR

## 2022-12-21 MED ORDER — LACTATED RINGERS IV SOLN: INTRAVENOUS | Status: DC

## 2022-12-21 MED ORDER — SIGHTPATH DOSE#1 BSS IO SOLN
INTRAOCULAR | Status: DC | PRN
  Administered 2022-12-21: 2 mL

## 2022-12-21 SURGICAL SUPPLY — 18 items
CANNULA ANT/CHMB 27G (MISCELLANEOUS) IMPLANT
CANNULA ANT/CHMB 27GA (MISCELLANEOUS) IMPLANT
CATARACT SUITE SIGHTPATH (MISCELLANEOUS) ×1 IMPLANT
FEE CATARACT SUITE SIGHTPATH (MISCELLANEOUS) ×1 IMPLANT
GLOVE SRG 8 PF TXTR STRL LF DI (GLOVE) ×1 IMPLANT
GLOVE SURG GAMMEX PI TX LF 7.5 (GLOVE) IMPLANT
GLOVE SURG UNDER POLY LF SZ8 (GLOVE) ×1
LENS IOL TECNIS EYHANCE 26.0 (Intraocular Lens) IMPLANT
NDL FILTER BLUNT 18X1 1/2 (NEEDLE) ×1 IMPLANT
NEEDLE FILTER BLUNT 18X1 1/2 (NEEDLE) ×1 IMPLANT
PACK VIT ANT 23G (MISCELLANEOUS) IMPLANT
RING MALYGIN 7.0 (MISCELLANEOUS) IMPLANT
SUT ETHILON 10-0 CS-B-6CS-B-6 (SUTURE)
SUT VICRYL  9 0 (SUTURE)
SUT VICRYL 9 0 (SUTURE) IMPLANT
SUTURE EHLN 10-0 CS-B-6CS-B-6 (SUTURE) IMPLANT
SYR 3ML LL SCALE MARK (SYRINGE) ×1 IMPLANT
WATER STERILE IRR 250ML POUR (IV SOLUTION) ×1 IMPLANT

## 2022-12-21 NOTE — Anesthesia Postprocedure Evaluation (Signed)
Anesthesia Post Note  Patient: Carol Carey  Procedure(s) Performed: CATARACT EXTRACTION PHACO AND INTRAOCULAR LENS PLACEMENT (IOC) RIGHT MALYUGIN 13.45 01:24.5 (Right: Eye)  Patient location during evaluation: PACU Anesthesia Type: MAC Level of consciousness: awake and alert Pain management: pain level controlled Vital Signs Assessment: post-procedure vital signs reviewed and stable Respiratory status: spontaneous breathing, nonlabored ventilation, respiratory function stable and patient connected to nasal cannula oxygen Cardiovascular status: blood pressure returned to baseline and stable Postop Assessment: no apparent nausea or vomiting Anesthetic complications: no   No notable events documented.   Last Vitals:  Vitals:   12/21/22 0938 12/21/22 0944  BP: (!) 145/86 (!) 141/74  Pulse: 69 71  Resp: 14 (!) 21  Temp: (!) 36.3 C (!) 36.3 C  SpO2: 97% 96%    Last Pain:  Vitals:   12/21/22 0944  TempSrc:   PainSc: 0-No pain                 Precious Haws Destani Wamser

## 2022-12-21 NOTE — H&P (Signed)
[facility]   Primary Care Physician:  Idelle Crouch, MD Ophthalmologist: Dr. Leandrew Koyanagi  Pre-Procedure History & Physical: HPI:  Carol Carey is a 72 y.o. female here for ophthalmic surgery.   Past Medical History:  Diagnosis Date   Aortic atherosclerosis ([place])    Arthritis    Ascending aorta dilatation (HCC)    a.) measured 3.9cm by TTE on 06/29/21   CAD (coronary artery disease)    Carotid artery stenosis    Colon polyp    COPD (chronic obstructive pulmonary disease) (HCC)    Degenerative joint disease    GERD (gastroesophageal reflux disease)    Hepatic steatosis    Hyperlipidemia    Hypertension    Hypothyroidism    Melanoma in situ ([place]) 08/13/2021   L forearm   Osteoporosis    Pulmonary emphysema (HCC)    Seasonal allergies    Skin cancer    lesion removed from nose   Sleep apnea    not on nocturnal PAP therapy   Spinal stenosis    Valvular insufficiency    a.) TTE on 06/29/2021 --> moderate AR, trivial MR/TR, mild PR    Past Surgical History:  Procedure Laterality Date   ABDOMINAL EXPOSURE N/A 09/30/2020   Procedure: ABDOMINAL EXPOSURE;  Surgeon: Algernon Huxley, MD;  Location: [facility];  Service: Vascular;  Laterality: N/A;   [place] N/A 09/30/2020   Procedure: L5-S1 ANTERIOR LUMBAR INTERBODY FUSION, L3-S1 INSTRUMENTATION;  Surgeon: Meade Maw, MD;  Location: [facility];  Service: Neurosurgery;  Laterality: N/A;   ANTERIOR CERVICAL DECOMP/DISCECTOMY FUSION N/A 07/09/2021   Procedure: C4-5 ANTERIOR CERVICAL DECOMPRESSION/DISCECTOMY FUSION 1 LEVEL;  Surgeon: Meade Maw, MD;  Location: [facility];  Service: Neurosurgery;  Laterality: N/A;  schedule as first case   APPENDECTOMY  1974   BACK SURGERY     BREAST BIOPSY Right 02/15/2021   Q clip, [place] bx, 9:30 5cmfn Usual Ductal Hyperplasia, PASH   BREAST BIOPSY Left 02/15/2021   x clip, [place] bx, 1:00 2cmfn, Usual Ductal  Hyperplasia, Apocrine Metaplasia, PASH   BREAST EXCISIONAL BIOPSY Right 1985   Benign cycts removed   CATARACT EXTRACTION W/PHACO Left 11/23/2022   Procedure: CATARACT EXTRACTION PHACO AND INTRAOCULAR LENS PLACEMENT ([place]) LEFT MALYUGIN;  Surgeon: Leandrew Koyanagi, MD;  Location: [place];  Service: Ophthalmology;  Laterality: Left;  11.46 1:16.1   CESAREAN SECTION  1993   COLONOSCOPY     LUMBAR FUSION  2010   Dr. March Rummage   LUMBAR FUSION  2013   x 2 Dr. Ellender Hose   TONSILLECTOMY     TUBAL LIGATION  1974   UPPER GI ENDOSCOPY      Prior to Admission medications   Medication Sig Start Date End Date Taking? Authorizing Provider  atenolol (TENORMIN) 50 MG tablet Take 50 mg by mouth at bedtime.    Yes [provider]  atorvastatin (LIPITOR) 20 MG tablet Take 20 mg by mouth daily.  05/09/19  Yes [provider]  CALCIUM-VITAMIN D PO Take 1 tablet by mouth daily.   Yes [provider]  cyclobenzaprine (FLEXERIL) 10 MG tablet Take 10 mg by mouth 3 (three) times daily as needed for muscle spasms.   Yes [provider]  esomeprazole (NEXIUM) 20 MG capsule Take 20 mg by mouth at bedtime. 01/30/19  Yes [provider]  fluticasone (FLONASE) 50 MCG/ACT nasal spray Place 1 spray into the nose daily as needed  for allergies. 12/08/20  Yes [provider]  gabapentin (NEURONTIN) 300 MG capsule Take 300 mg by mouth 3 (three) times daily. 06/29/22  Yes [provider]  hydrochlorothiazide (HYDRODIURIL) 25 MG tablet Take 25 mg by mouth daily. 01/29/19  Yes [provider]  isosorbide mononitrate (IMDUR) 30 MG 24 hr tablet Take 30 mg by mouth daily. 06/16/21  Yes [provider]  levothyroxine (SYNTHROID) 100 MCG tablet Take 100 mcg by mouth at bedtime. 10/22/20  Yes [provider]  losartan (COZAAR) 100 MG tablet Take 100 mg by mouth daily.  05/27/19  Yes [provider]  mupirocin ointment (BACTROBAN) 2 %  Apply topically daily. 06/02/22  Yes [provider]  NEOMYCIN-POLYMYXIN-HYDROCORTISONE (CORTISPORIN) 1 % SOLN OTIC solution Apply 1-2 drops to toe BID after soaking 06/23/22  Yes Hyatt, Max T, DPM  Potassium 99 MG TABS Take 99 mg by mouth daily.   Yes [provider]  hydrocortisone 2.5 % cream Apply 1 application topically 2 (two) times daily as needed (hemorrhoids).    [provider]    Allergies as of 10/20/2022 - Review Complete 08/23/2022  Allergen Reaction Noted   Demerol [meperidine hcl] Nausea And Vomiting 04/03/2018   Latex Rash 04/03/2018   Lisinopril Cough 02/13/2018   Penicillins Rash 04/03/2018   Sulfa antibiotics Rash 04/03/2018    Family History  Problem Relation Age of Onset   Hypertension Mother    Heart attack Father    Lung cancer Brother    Breast cancer Neg Hx     Social History   Socioeconomic History   Marital status: Married    Spouse name: Not on file   Number of children: Not on file   Years of education: Not on file   Highest education level: Not on file  Occupational History   Not on file  Tobacco Use   Smoking status: Former    Packs/day: 1.00    Years: 48.00    Total pack years: 48.00    Types: Cigarettes    Quit date: 02/14/2020    Years since quitting: 2.8   Smokeless tobacco: Never  Vaping Use   Vaping Use: Never used  Substance and Sexual Activity   Alcohol use: Yes    Alcohol/week: 14.0 standard drinks of alcohol    Types: 14 Glasses of wine per week   Drug use: Not Currently    Types: Marijuana    Comment: 2018 tried using   Sexual activity: Not on file  Other Topics Concern   Not on file  Social History Narrative   Not on file   Social Determinants of Health   Financial Resource Strain: Not on file  Food Insecurity: Not on file  Transportation Needs: Not on file  Physical Activity: Not on file  Stress: Not on file  Social Connections: Not on file  Intimate Partner Violence: Not on file     Review of Systems: See HPI, otherwise negative ROS  Physical Exam: BP (!) 177/76   Temp (!) 97.3 F (36.3 C) (Tympanic)   Ht 4' 11.02" (1.499 m)   Wt 78 kg   SpO2 96%   BMI 34.72 kg/m  General:   Alert,  pleasant and cooperative in NAD Head:  Normocephalic and atraumatic. Lungs:  Clear to auscultation.    Heart:  Regular rate and rhythm.   Impression/Plan: Carol Carey is here for ophthalmic surgery.  Risks, benefits, limitations, and alternatives regarding ophthalmic surgery have been reviewed with the  patient.  Questions have been answered.  All parties agreeable.   Leandrew Koyanagi, MD  12/21/2022, 8:10 AM

## 2022-12-21 NOTE — Transfer of Care (Signed)
Immediate Anesthesia Transfer of Care Note  Patient: Carol Carey  Procedure(s) Performed: CATARACT EXTRACTION PHACO AND INTRAOCULAR LENS PLACEMENT (IOC) RIGHT MALYUGIN 13.45 01:24.5 (Right: Eye)  Patient Location: PACU  Anesthesia Type: MAC  Level of Consciousness: awake, alert  and patient cooperative  Airway and Oxygen Therapy: Patient Spontanous Breathing and Patient connected to supplemental oxygen  Post-op Assessment: Post-op Vital signs reviewed, Patient's Cardiovascular Status Stable, Respiratory Function Stable, Patent Airway and No signs of Nausea or vomiting  Post-op Vital Signs: Reviewed and stable  Complications: No notable events documented.

## 2022-12-21 NOTE — Anesthesia Preprocedure Evaluation (Addendum)
Anesthesia Evaluation  Patient identified by MRN, date of birth, ID band Patient awake    Reviewed: Allergy & Precautions, NPO status , Patient's Chart, lab work & pertinent test results  History of Anesthesia Complications Negative for: history of anesthetic complications  Airway Mallampati: III  TM Distance: <3 FB Neck ROM: full    Dental  (+) Chipped, Poor Dentition, Missing   Pulmonary shortness of breath and with exertion, sleep apnea , COPD, former smoker   Pulmonary exam normal        Cardiovascular hypertension, (-) angina + CAD  Normal cardiovascular exam     Neuro/Psych  Neuromuscular disease  negative psych ROS   GI/Hepatic Neg liver ROS,GERD  Controlled,,  Endo/Other  Hypothyroidism    Renal/GU      Musculoskeletal   Abdominal   Peds  Hematology negative hematology ROS (+)   Anesthesia Other Findings Past Medical History: No date: Aortic atherosclerosis (HCC) No date: Arthritis No date: Ascending aorta dilatation (HCC)     Comment:  a.) measured 3.9cm by TTE on 06/29/21 No date: CAD (coronary artery disease) No date: Carotid artery stenosis No date: Colon polyp No date: COPD (chronic obstructive pulmonary disease) (HCC) No date: Degenerative joint disease No date: GERD (gastroesophageal reflux disease) No date: Hepatic steatosis No date: Hyperlipidemia No date: Hypertension No date: Hypothyroidism 08/13/2021: Melanoma in situ ([place])     Comment:  L forearm No date: Osteoporosis No date: Pulmonary emphysema (HCC) No date: Seasonal allergies No date: Skin cancer     Comment:  lesion removed from nose No date: Sleep apnea     Comment:  not on nocturnal PAP therapy No date: Spinal stenosis No date: Valvular insufficiency     Comment:  a.) TTE on 06/29/2021 --> moderate AR, trivial MR/TR,               mild PR  Past Surgical History: 09/30/2020: ABDOMINAL EXPOSURE; N/A     Comment:   Procedure: ABDOMINAL EXPOSURE;  Surgeon: Algernon Huxley,               MD;  Location: [facility];  Service: Vascular;  Laterality:              N/A; 1993: ABDOMINAL HYSTERECTOMY 09/30/2020: ANTERIOR AND POSTERIOR SPINAL FUSION; N/A     Comment:  Procedure: L5-S1 ANTERIOR LUMBAR INTERBODY FUSION, L3-S1              INSTRUMENTATION;  Surgeon: Meade Maw, MD;                Location: [facility];  Service: Neurosurgery;  Laterality:               N/A; 07/09/2021: ANTERIOR CERVICAL DECOMP/DISCECTOMY FUSION; N/A     Comment:  Procedure: C4-5 ANTERIOR CERVICAL               DECOMPRESSION/DISCECTOMY FUSION 1 LEVEL;  Surgeon:               Meade Maw, MD;  Location: [facility];  Service:               Neurosurgery;  Laterality: N/A;  schedule as first case 1974: APPENDECTOMY No date: BACK SURGERY 02/15/2021: BREAST BIOPSY; Right     Comment:  Q clip, [place] bx, 9:30 5cmfn Usual Ductal Hyperplasia, PASH 02/15/2021: BREAST BIOPSY; Left     Comment:  x clip, [place] bx, 1:00 2cmfn, Usual Ductal Hyperplasia,  Apocrine Metaplasia, PASH 1985: BREAST EXCISIONAL BIOPSY; Right     Comment:  Benign cycts removed 11/23/2022: CATARACT EXTRACTION W/PHACO; Left     Comment:  Procedure: CATARACT EXTRACTION PHACO AND INTRAOCULAR               LENS PLACEMENT ([place]) LEFT MALYUGIN;  Surgeon: Leandrew Koyanagi, MD;  Location: [place];  Service:               Ophthalmology;  Laterality: Left;  11.46 1:16.1 1993: CESAREAN SECTION No date: COLONOSCOPY 2010: LUMBAR FUSION     Comment:  Dr. March Rummage 2013: LUMBAR FUSION     Comment:  x 2 Dr. Ellender Hose No date: TONSILLECTOMY 1974: TUBAL LIGATION No date: UPPER GI ENDOSCOPY  BMI    Body Mass Index: 34.72 kg/m      Reproductive/Obstetrics negative OB ROS                             Anesthesia Physical Anesthesia Plan  ASA: 3  Anesthesia Plan: MAC   Post-op Pain Management:    Induction:  Intravenous  PONV Risk Score and Plan:   Airway Management Planned: Natural Airway and Nasal Cannula  Additional Equipment:   Intra-op Plan:   Post-operative Plan:   Informed Consent: I have reviewed the patients History and Physical, chart, labs and discussed the procedure including the risks, benefits and alternatives for the proposed anesthesia with the patient or authorized representative who has indicated his/her understanding and acceptance.     Dental Advisory Given  Plan Discussed with: Anesthesiologist, CRNA and Surgeon  Anesthesia Plan Comments: (Patient consented for risks of anesthesia including but not limited to:  - adverse reactions to medications - damage to eyes, teeth, lips or other oral mucosa - nerve damage due to positioning  - sore throat or hoarseness - Damage to heart, brain, nerves, lungs, other parts of body or loss of life  Patient voiced understanding.)       Anesthesia Quick Evaluation

## 2022-12-21 NOTE — Op Note (Signed)
  OPERATIVE NOTE  Carol Carey [id number] 12/21/2022   PREOPERATIVE DIAGNOSIS:    Nuclear Sclerotic Cataract Right eye with miotic pupil.        H25.11  POSTOPERATIVE DIAGNOSIS: Nuclear Sclerotic Cataract Right eye with miotic pupil.          PROCEDURE:  Phacoemusification with posterior chamber intraocular lens placement of the right eye which required pupil stretching with the Malyugin pupil expansion device. Ultrasound time: Procedure(s): CATARACT EXTRACTION PHACO AND INTRAOCULAR LENS PLACEMENT (IOC) RIGHT MALYUGIN 13.45 01:24.5 (Right)  LENS:   Implant Name Type Inv. Item Serial No. Manufacturer Lot No. LRB No. Used Action  LENS IOL TECNIS EYHANCE 26.0 - [id number] Intraocular Lens LENS IOL TECNIS EYHANCE 26.0 [id number] [employer]  Right 1 Implanted       SURGEON:  Wyonia Hough, MD   ANESTHESIA:  Topical with tetracaine drops and 2% Xylocaine jelly, augmented with 1% preservative-free intracameral lidocaine.   COMPLICATIONS:  None.   DESCRIPTION OF PROCEDURE:  The patient was identified in the holding room and transported to the operating room and placed in the supine position under the operating microscope. Theright eye was identified as the operative eye and it was prepped and draped in the usual sterile ophthalmic fashion.   A 1 millimeter clear-corneal paracentesis was made at the 12:00 position.  0.5 ml of preservative-free 1% lidocaine was injected into the anterior chamber. The anterior chamber was filled with Viscoat viscoelastic.  A 2.4 millimeter keratome was used to make a near-clear corneal incision at the 9:00 position. A Malyugin pupil expander was then placed through the main incision and into the anterior chamber of the eye.  The edge of the iris was secured on the lip of the pupil expander and it was released, thereby expanding the pupil to approximately 7 millimeters for completion of the cataract surgery.  Additional Viscoat was placed in the anterior  chamber.  A cystotome and capsulorrhexis forceps were used to make a curvilinear capsulorrhexis.   Balanced salt solution was used to hydrodissect and hydrodelineate the lens nucleus.   Phacoemulsification was used in stop and chop fashion to remove the lens, nucleus and epinucleus.  The remaining cortex was aspirated using the irrigation aspiration handpiece.  Additional Provisc was placed into the eye to distend the capsular bag for lens placement.  A lens was then injected into the capsular bag.  The pupil expanding ring was removed using a Kuglen hook and insertion device. The remaining viscoelastic was aspirated from the capsular bag and the anterior chamber.  The anterior chamber was filled with balanced salt solution to inflate to a physiologic pressure.  Wounds were hydrated with balanced salt solution.  The anterior chamber was inflated to a physiologic pressure with balanced salt solution.  No wound leaks were noted.Vigamox 0.2 ml of a '1mg'$  per ml solution was injected into the anterior chamber for a dose of 0.2 mg of intracameral antibiotic at the completion of the case. Timolol and Brimonidine drops were applied to the eye.  The patient was taken to the recovery room in stable condition without complications of anesthesia or surgery.  Devlon Dosher 12/21/2022, 9:37 AM

## 2022-12-22 ENCOUNTER — Encounter: Payer: Self-pay | Admitting: Ophthalmology

## 2022-12-27 NOTE — Progress Notes (Signed)
Referring Physician:  Frederica Kuster, PA-C [place],  [place] [address]  Primary Physician:  Idelle Crouch, MD  History of Present Illness: 01/03/2023 Carol Carey has a history of anxiety, GERD, HTN, hypothyroidism, osteoporosis, emphysema, skin CA, and OSA.   History of ACDF C4-C5 by Dr. Izora Ribas on 07/09/21. Last seen by him on 03/31/22 and she was doing well. She was sent to PT to optimize her balance.   History of multiple lumbar surgeries. Most recent was L5-S1 ALIF, revision L5-S1 posterior spinal fusion on 09/30/20.   Seen at [facility] on 12/15/22 for back pain that was in between her shoulder blades but some pain did radiate into her lower back. She had this pain for about 2 weeks prior. No radiation of pain into her arms. No weakness in her arms.   She has no current pain. Her thoracic pain has resolved. She has chronic intermittent LBP. No significant leg pain. She has known neuropathy in her feet.    Conservative measures:  Physical therapy: was PT for her balance last fall, stopped when her son passed, has not been back yet.   Multimodal medical therapy including regular antiinflammatories: zanaflex, lidoderm patches, flexeril, neurontin Injections: No recent epidural steroid injections  Past Surgery:  ACDF C4-C5 by Dr. Izora Ribas on 07/09/21 L5-S1 ALIF, revision L5-S1 posterior spinal fusion by Dr. Izora Ribas on 09/30/20 lumbar decompression and fusion x 2 in 2013 by Dr. Ellender Hose  lumbar fusion in 2010 by Dr. Burgess Amor has some chronic balance issues that were improving with PT. No significant dexterity issues.   The symptoms are causing a significant impact on the patient's life.   Review of Systems:  A 10 point review of systems is negative, except for the pertinent positives and negatives detailed in the HPI.  Past Medical History: Past Medical History:  Diagnosis Date   Aortic atherosclerosis ([place])    Arthritis    Ascending aorta  dilatation (HCC)    a.) measured 3.9cm by TTE on 06/29/21   CAD (coronary artery disease)    Carotid artery stenosis    Colon polyp    COPD (chronic obstructive pulmonary disease) (HCC)    Degenerative joint disease    GERD (gastroesophageal reflux disease)    Hepatic steatosis    Hyperlipidemia    Hypertension    Hypothyroidism    Melanoma in situ ([place]) 08/13/2021   L forearm   Osteoporosis    Pulmonary emphysema (HCC)    Seasonal allergies    Skin cancer    lesion removed from nose   Sleep apnea    not on nocturnal PAP therapy   Spinal stenosis    Valvular insufficiency    a.) TTE on 06/29/2021 --> moderate AR, trivial MR/TR, mild PR    Past Surgical History: Past Surgical History:  Procedure Laterality Date   ABDOMINAL EXPOSURE N/A 09/30/2020   Procedure: ABDOMINAL EXPOSURE;  Surgeon: Algernon Huxley, MD;  Location: [facility] ORS;  Service: Vascular;  Laterality: N/A;   [place] N/A 09/30/2020   Procedure: L5-S1 ANTERIOR LUMBAR INTERBODY FUSION, L3-S1 INSTRUMENTATION;  Surgeon: Meade Maw, MD;  Location: [facility] ORS;  Service: Neurosurgery;  Laterality: N/A;   ANTERIOR CERVICAL DECOMP/DISCECTOMY FUSION N/A 07/09/2021   Procedure: C4-5 ANTERIOR CERVICAL DECOMPRESSION/DISCECTOMY FUSION 1 LEVEL;  Surgeon: Meade Maw, MD;  Location: [facility] ORS;  Service: Neurosurgery;  Laterality: N/A;  schedule as first case  APPENDECTOMY  1974   BACK SURGERY     BREAST BIOPSY Right 02/15/2021   Q clip, [place] bx, 9:30 5cmfn Usual Ductal Hyperplasia, PASH   BREAST BIOPSY Left 02/15/2021   x clip, [place] bx, 1:00 2cmfn, Usual Ductal Hyperplasia, Apocrine Metaplasia, PASH   BREAST EXCISIONAL BIOPSY Right 1985   Benign cycts removed   CATARACT EXTRACTION W/PHACO Left 11/23/2022   Procedure: CATARACT EXTRACTION PHACO AND INTRAOCULAR LENS PLACEMENT ([place]) LEFT MALYUGIN;  Surgeon: Leandrew Koyanagi, MD;  Location: [place];   Service: Ophthalmology;  Laterality: Left;  11.46 1:16.1   CATARACT EXTRACTION W/PHACO Right 12/21/2022   Procedure: CATARACT EXTRACTION PHACO AND INTRAOCULAR LENS PLACEMENT ([place]) RIGHT MALYUGIN 13.45 01:24.5;  Surgeon: Leandrew Koyanagi, MD;  Location: [place];  Service: Ophthalmology;  Laterality: Right;   [place]  2010   Dr. March Rummage   LUMBAR FUSION  2013   x 2 Dr. Ellender Hose   TONSILLECTOMY     TUBAL LIGATION  1974   UPPER GI ENDOSCOPY      Allergies: Allergies as of 01/03/2023 - Review Complete 01/03/2023  Allergen Reaction Noted   Demerol [meperidine hcl] Nausea And Vomiting 04/03/2018   Latex Rash 04/03/2018   Lisinopril Cough 02/13/2018   Penicillins Rash 04/03/2018   Sulfa antibiotics Rash 04/03/2018    Medications: Outpatient Encounter Medications as of 01/03/2023  Medication Sig   atenolol (TENORMIN) 50 MG tablet Take 50 mg by mouth at bedtime.    atorvastatin (LIPITOR) 20 MG tablet Take 20 mg by mouth daily.    CALCIUM-VITAMIN D PO Take 1 tablet by mouth daily.   esomeprazole (NEXIUM) 20 MG capsule Take 20 mg by mouth at bedtime.   fluticasone (FLONASE) 50 MCG/ACT nasal spray Place 1 spray into the nose daily as needed for allergies.   gabapentin (NEURONTIN) 300 MG capsule Take 300 mg by mouth 3 (three) times daily.   hydrochlorothiazide (HYDRODIURIL) 25 MG tablet Take 25 mg by mouth daily.   hydrocortisone 2.5 % cream Apply 1 application topically 2 (two) times daily as needed (hemorrhoids).   isosorbide mononitrate (IMDUR) 30 MG 24 hr tablet Take 30 mg by mouth daily.   levothyroxine (SYNTHROID) 100 MCG tablet Take 100 mcg by mouth at bedtime.   losartan (COZAAR) 100 MG tablet Take 100 mg by mouth daily.    mupirocin ointment (BACTROBAN) 2 % Apply topically daily.   NEOMYCIN-POLYMYXIN-HYDROCORTISONE (CORTISPORIN) 1 % SOLN OTIC solution Apply 1-2 drops to toe BID after soaking   Potassium 99 MG TABS Take 99 mg  by mouth daily.   tiZANidine (ZANAFLEX) 2 MG tablet Take 2 mg by mouth 3 (three) times daily.   cyclobenzaprine (FLEXERIL) 10 MG tablet Take 10 mg by mouth 3 (three) times daily as needed for muscle spasms.   No facility-administered encounter medications on file as of 01/03/2023.    Social History: Social History   Tobacco Use   Smoking status: Former    Packs/day: 1.00    Years: 48.00    Total pack years: 48.00    Types: Cigarettes    Quit date: 02/14/2020    Years since quitting: 2.8   Smokeless tobacco: Never  Vaping Use   Vaping Use: Never used  Substance Use Topics   Alcohol use: Yes    Alcohol/week: 14.0 standard drinks of alcohol    Types: 14 Glasses of wine per week   Drug use: Not Currently  Types: Marijuana    Comment: 2018 tried using    Family Medical History: Family History  Problem Relation Age of Onset   Hypertension Mother    Heart attack Father    Lung cancer Brother    Breast cancer Neg Hx     Physical Examination: Vitals:   01/03/23 1401  BP: (!) 140/68    General: Patient is well developed, well nourished, calm, collected, and in no apparent distress. Attention to examination is appropriate.  Respiratory: Patient is breathing without any difficulty.   NEUROLOGICAL:     Awake, alert, oriented to person, place, and time.  Speech is clear and fluent. Fund of knowledge is appropriate.   Cranial Nerves: Pupils equal round and reactive to light.  Facial tone is symmetric.    No posterior cervical tenderness. No tenderness in bilateral trapezial region.   No thoracic tenderness noted.   No posterior lumbar tenderness.   No abnormal lesions on exposed skin.   Strength: Side Biceps Triceps Deltoid Interossei Grip Wrist Ext. Wrist Flex.  R '5 5 5 5 5 5 5  '$ L '5 5 5 5 5 5 5   '$ Side Iliopsoas Quads Hamstring PF DF EHL  R '5 5 5 5 5 5  '$ L '5 5 5 5 5 5   '$ Reflexes are 2+ and symmetric at the biceps, triceps, brachioradialis, patella and  achilles.   Hoffman's is absent.  Clonus is not present.   Bilateral upper and lower extremity sensation is intact to light touch.     Gait is normal.     Medical Decision Making  Imaging: Lumbar xrays dated 12/15/22:  Instrumented fusion L3-S1 with hardware in good position.   Radiology report not available for above xrays. Above xrays reviewed with Dr. Izora Ribas.   Assessment and Plan: Carol Carey is a pleasant 72 y.o. female had some thoracic pain earlier this month (between her shoulder blades) that has since resolved.   History of ACDF C4-C5 by Dr. Izora Ribas on 07/09/21. Also with history of multiple lumbar surgeries. Most recent was L5-S1 ALIF, revision L5-S1 posterior spinal fusion on 09/30/20 by Dr. Izora Ribas.   She has chronic intermittent LBP. No significant leg pain.   Lumbar xrays show hardware in good position.   Treatment options discussed with patient and following plan made:   - Continue with current activity.  - Okay to continue prn zanaflex for muscle spasms. No refills needed.  - In review of her note from [facility] clinic- she had chest xray done that showed ?compression deformity at T7 or T8. Will get copy of these xrays and review them. As she is not having any pain or tenderness, it may be old. Will message her in [place].   I spent a total of 15 minutes in face-to-face and non-face-to-face activities related to this patient's care today including review of outside records, review of imaging, review of symptoms, physical exam, discussion of differential diagnosis, discussion of treatment options, and documentation.   Thank you for involving me in the care of this patient.   Geronimo Boot PA-C Dept. of Neurosurgery

## 2023-01-03 ENCOUNTER — Encounter: Payer: Self-pay | Admitting: Orthopedic Surgery

## 2023-01-03 ENCOUNTER — Ambulatory Visit (INDEPENDENT_AMBULATORY_CARE_PROVIDER_SITE_OTHER): Payer: Medicare Other | Admitting: Orthopedic Surgery

## 2023-01-03 VITALS — BP 140/68 | Ht 59.0 in | Wt 171.8 lb

## 2023-01-03 DIAGNOSIS — Z981 Arthrodesis status: Secondary | ICD-10-CM

## 2023-01-03 DIAGNOSIS — M546 Pain in thoracic spine: Secondary | ICD-10-CM | POA: Diagnosis not present

## 2023-01-03 DIAGNOSIS — G8929 Other chronic pain: Secondary | ICD-10-CM | POA: Diagnosis not present

## 2023-01-03 DIAGNOSIS — M545 Low back pain, unspecified: Secondary | ICD-10-CM | POA: Diagnosis not present

## 2023-01-03 DIAGNOSIS — M549 Dorsalgia, unspecified: Secondary | ICD-10-CM

## 2023-01-03 NOTE — Patient Instructions (Signed)
It was so nice to see you today. Thank you so much for coming in.    I will get the xrays of your mid back from [facility] and message you.   We will leave your follow up open, but please let me know if you need anything.   Please do not hesitate to call if you have any questions or concerns. You can also message me in [place].   Geronimo Boot PA-C [phone]

## 2023-01-04 ENCOUNTER — Ambulatory Visit
Admission: RE | Admit: 2023-01-04 | Discharge: 2023-01-04 | Disposition: A | Discharge status: Home / Self Care | Payer: Self-pay | Source: Ambulatory Visit | Attending: Orthopedic Surgery | Admitting: Orthopedic Surgery

## 2023-01-04 ENCOUNTER — Other Ambulatory Visit: Payer: Self-pay

## 2023-01-04 DIAGNOSIS — M79642 Pain in left hand: Secondary | ICD-10-CM

## 2023-01-04 DIAGNOSIS — Z049 Encounter for examination and observation for unspecified reason: Secondary | ICD-10-CM

## 2023-01-17 NOTE — Addendum Note (Signed)
Addended byGeronimo Boot on: 01/17/2023 10:49 AM   Modules accepted: Orders

## 2023-02-14 ENCOUNTER — Ambulatory Visit (INDEPENDENT_AMBULATORY_CARE_PROVIDER_SITE_OTHER): Payer: Medicare Other | Admitting: Vascular Surgery

## 2023-02-14 ENCOUNTER — Encounter (INDEPENDENT_AMBULATORY_CARE_PROVIDER_SITE_OTHER): Payer: Self-pay | Admitting: Vascular Surgery

## 2023-02-14 ENCOUNTER — Ambulatory Visit (INDEPENDENT_AMBULATORY_CARE_PROVIDER_SITE_OTHER): Payer: Medicare Other

## 2023-02-14 VITALS — BP 130/77 | HR 62 | Resp 16 | Wt 172.8 lb

## 2023-02-14 DIAGNOSIS — E785 Hyperlipidemia, unspecified: Secondary | ICD-10-CM

## 2023-02-14 DIAGNOSIS — I6523 Occlusion and stenosis of bilateral carotid arteries: Secondary | ICD-10-CM

## 2023-02-14 DIAGNOSIS — I10 Essential (primary) hypertension: Secondary | ICD-10-CM | POA: Diagnosis not present

## 2023-02-14 NOTE — Assessment & Plan Note (Signed)
Duplex today shows no change with stable 1-39% right carotid stenosis and 40-59% left carotid stenosis. No changes.  No medications changes.  RTC one year.

## 2023-02-14 NOTE — Progress Notes (Signed)
MRN : [id number]  Carol Carey is a 72 y.o. (1951/06/07) female who presents with chief complaint of  Chief Complaint  Patient presents with   Follow-up    Ultrasound follow up  .  History of Present Illness: patient returns in follow up of her carotid disease.  She has a lot a neurologic issues but no cerebrovascular symptoms.  Specifically, the patient denies amaurosis fugax, speech or swallowing difficulties, or arm or leg weakness or numbness. Duplex today shows no change with stable 1-39% right carotid stenosis and 40-59% left carotid stenosis.   Current Outpatient Medications  Medication Sig Dispense Refill   atenolol (TENORMIN) 50 MG tablet Take 50 mg by mouth at bedtime.      atorvastatin (LIPITOR) 20 MG tablet Take 20 mg by mouth daily.      CALCIUM-VITAMIN D PO Take 1 tablet by mouth daily.     esomeprazole (NEXIUM) 20 MG capsule Take 20 mg by mouth at bedtime.     fluticasone (FLONASE) 50 MCG/ACT nasal spray Place 1 spray into the nose daily as needed for allergies.     gabapentin (NEURONTIN) 300 MG capsule Take 300 mg by mouth 3 (three) times daily.     hydrochlorothiazide (HYDRODIURIL) 25 MG tablet Take 25 mg by mouth daily.     hydrocortisone 2.5 % cream Apply 1 application topically 2 (two) times daily as needed (hemorrhoids).     isosorbide mononitrate (IMDUR) 30 MG 24 hr tablet Take 30 mg by mouth daily.     levothyroxine (SYNTHROID) 100 MCG tablet Take 100 mcg by mouth at bedtime.     losartan (COZAAR) 100 MG tablet Take 100 mg by mouth daily.      mupirocin ointment (BACTROBAN) 2 % Apply topically daily.     NEOMYCIN-POLYMYXIN-HYDROCORTISONE (CORTISPORIN) 1 % SOLN OTIC solution Apply 1-2 drops to toe BID after soaking 10 mL 1   Potassium 99 MG TABS Take 99 mg by mouth daily.     tiZANidine (ZANAFLEX) 2 MG tablet Take 2 mg by mouth 3 (three) times daily.     torsemide (DEMADEX) 20 MG tablet Take 20 mg by mouth daily.     cyclobenzaprine (FLEXERIL) 10 MG tablet  Take 10 mg by mouth 3 (three) times daily as needed for muscle spasms.     No current facility-administered medications for this visit.    Past Medical History:  Diagnosis Date   Aortic atherosclerosis ([place])    Arthritis    Ascending aorta dilatation (HCC)    a.) measured 3.9cm by TTE on 06/29/21   CAD (coronary artery disease)    Carotid artery stenosis    Colon polyp    COPD (chronic obstructive pulmonary disease) (HCC)    Degenerative joint disease    GERD (gastroesophageal reflux disease)    Hepatic steatosis    Hyperlipidemia    Hypertension    Hypothyroidism    Melanoma in situ ([place]) 08/13/2021   L forearm   Osteoporosis    Pulmonary emphysema (HCC)    Seasonal allergies    Skin cancer    lesion removed from nose   Sleep apnea    not on nocturnal PAP therapy   Spinal stenosis    Valvular insufficiency    a.) TTE on 06/29/2021 --> moderate AR, trivial MR/TR, mild PR    Past Surgical History:  Procedure Laterality Date   ABDOMINAL EXPOSURE N/A 09/30/2020   Procedure: ABDOMINAL EXPOSURE;  Surgeon: Algernon Huxley, MD;  Location: [facility]  ORS;  Service: Vascular;  Laterality: N/A;   ABDOMINAL HYSTERECTOMY  1993   ANTERIOR AND POSTERIOR SPINAL FUSION N/A 09/30/2020   Procedure: L5-S1 ANTERIOR LUMBAR INTERBODY FUSION, L3-S1 INSTRUMENTATION;  Surgeon: Meade Maw, MD;  Location: [facility] ORS;  Service: Neurosurgery;  Laterality: N/A;   ANTERIOR CERVICAL DECOMP/DISCECTOMY FUSION N/A 07/09/2021   Procedure: C4-5 ANTERIOR CERVICAL DECOMPRESSION/DISCECTOMY FUSION 1 LEVEL;  Surgeon: Meade Maw, MD;  Location: [facility] ORS;  Service: Neurosurgery;  Laterality: N/A;  schedule as first case   APPENDECTOMY  1974   BACK SURGERY     BREAST BIOPSY Right 02/15/2021   Q clip, [place] bx, 9:30 5cmfn Usual Ductal Hyperplasia, PASH   BREAST BIOPSY Left 02/15/2021   x clip, [place] bx, 1:00 2cmfn, Usual Ductal Hyperplasia, Apocrine Metaplasia, PASH   BREAST EXCISIONAL BIOPSY Right 1985   Benign  cycts removed   CATARACT EXTRACTION W/PHACO Left 11/23/2022   Procedure: CATARACT EXTRACTION PHACO AND INTRAOCULAR LENS PLACEMENT ([place]) LEFT MALYUGIN;  Surgeon: Leandrew Koyanagi, MD;  Location: [place];  Service: Ophthalmology;  Laterality: Left;  11.46 1:16.1   CATARACT EXTRACTION W/PHACO Right 12/21/2022   Procedure: CATARACT EXTRACTION PHACO AND INTRAOCULAR LENS PLACEMENT ([place]) RIGHT MALYUGIN 13.45 01:24.5;  Surgeon: Leandrew Koyanagi, MD;  Location: [place];  Service: Ophthalmology;  Laterality: Right;   CESAREAN SECTION  1993   COLONOSCOPY     LUMBAR FUSION  2010   Dr. March Rummage   LUMBAR FUSION  2013   x 2 Dr. Ellender Hose   TONSILLECTOMY     TUBAL LIGATION  1974   UPPER GI ENDOSCOPY       Social History   Tobacco Use   Smoking status: Former    Packs/day: 1.00    Years: 48.00    Total pack years: 48.00    Types: Cigarettes    Quit date: 02/14/2020    Years since quitting: 3.0   Smokeless tobacco: Never  Vaping Use   Vaping Use: Never used  Substance Use Topics   Alcohol use: Yes    Alcohol/week: 14.0 standard drinks of alcohol    Types: 14 Glasses of wine per week   Drug use: Not Currently    Types: Marijuana    Comment: 2018 tried using       Family History  Problem Relation Age of Onset   Hypertension Mother    Heart attack Father    Lung cancer Brother    Breast cancer Neg Hx      Allergies  Allergen Reactions   Demerol [Meperidine Hcl] Nausea And Vomiting    Vomiting lasted for 2 days after taking med   Latex Rash   Lisinopril Cough   Penicillins Rash   Sulfa Antibiotics Rash    REVIEW OF SYSTEMS (Negative unless checked)   Constitutional: '[]'$ Weight loss  '[]'$ Fever  '[]'$ Chills Cardiac: '[]'$ Chest pain   '[]'$ Chest pressure   '[]'$ Palpitations   '[]'$ Shortness of breath when laying flat   '[]'$ Shortness of breath at rest   '[]'$ Shortness of breath with exertion. Vascular:  '[]'$ Pain in legs with walking   '[]'$ Pain in legs at rest   '[]'$ Pain in legs when  laying flat   '[]'$ Claudication   '[]'$ Pain in feet when walking  '[]'$ Pain in feet at rest  '[]'$ Pain in feet when laying flat   '[]'$ History of DVT   '[]'$ Phlebitis   '[]'$ Swelling in legs   '[]'$ Varicose veins   '[]'$ Non-healing ulcers Pulmonary:   '[]'$ Uses home oxygen   '[]'$ Productive cough   '[]'$ Hemoptysis   '[]'$ Wheeze  '[]'$   COPD   '[]'$ Asthma Neurologic:  '[]'$ Dizziness  '[]'$ Blackouts   '[]'$ Seizures   '[]'$ History of stroke   '[]'$ History of TIA  '[]'$ Aphasia   '[]'$ Temporary blindness   '[]'$ Dysphagia   '[]'$ Weakness or numbness in arms   '[x]'$ Weakness or numbness in legs Musculoskeletal:  '[x]'$ Arthritis   '[]'$ Joint swelling   '[]'$ Joint pain   '[x]'$ Low back pain Hematologic:  '[]'$ Easy bruising  '[]'$ Easy bleeding   '[]'$ Hypercoagulable state   '[x]'$ Anemic   Gastrointestinal:  '[]'$ Blood in stool   '[]'$ Vomiting blood  '[x]'$ Gastroesophageal reflux/heartburn   '[]'$ Abdominal pain Genitourinary:  '[]'$ Chronic kidney disease   '[]'$ Difficult urination  '[]'$ Frequent urination  '[]'$ Burning with urination   '[]'$ Hematuria Skin:  '[]'$ Rashes   '[]'$ Ulcers   '[]'$ Wounds Psychological:  '[]'$ History of anxiety   '[]'$  History of major depression.   Physical Examination  Vitals:   02/14/23 1354  BP: 130/77  Pulse: 62  Resp: 16  Weight: 172 lb 12.8 oz (78.4 kg)   Body mass index is 34.9 kg/m. Gen:  WD/WN, NAD Head: [place]/AT, No temporalis wasting. Ear/Nose/Throat: Hearing grossly intact, nares w/o erythema or drainage, trachea midline Eyes: Conjunctiva clear. Sclera non-icteric Neck: Supple.  No bruit  Pulmonary:  Good air movement, equal and clear to auscultation bilaterally.  Cardiac: RRR, No JVD Vascular:  Vessel Right Left  Radial Palpable Palpable               Musculoskeletal: M/S 5/5 throughout.  No deformity or atrophy. No edema. Neurologic: CN 2-12 intact. Sensation grossly intact in extremities.  Symmetrical.  Speech is fluent. Motor exam as listed above. Psychiatric: Judgment intact, Mood & affect appropriate for pt's clinical situation. Dermatologic: No rashes or ulcers noted.  No cellulitis or  open wounds.    CBC Lab Results  Component Value Date   WBC 5.6 07/01/2021   HGB 11.5 (L) 07/01/2021   HCT 31.9 (L) 07/01/2021   MCV 91.9 07/01/2021   PLT 198 07/01/2021    BMET    Component Value Date/Time   NA 129 (L) 07/01/2021 0959   K 3.3 (L) 07/01/2021 0959   CL 90 (L) 07/01/2021 0959   CO2 27 07/01/2021 0959   GLUCOSE 118 (H) 07/01/2021 0959   BUN 10 07/01/2021 0959   CREATININE 0.93 07/01/2021 0959   CALCIUM 8.9 07/01/2021 0959   GFRNONAA >60 07/01/2021 0959   GFRAA >60 05/15/2019 0935   CrCl cannot be calculated (Patient's most recent lab result is older than the maximum 21 days allowed.).  COAG Lab Results  Component Value Date   INR 1.0 09/21/2020    Radiology No results found.   Assessment/Plan Carotid stenosis Duplex today shows no change with stable 1-39% right carotid stenosis and 40-59% left carotid stenosis. No changes.  No medications changes.  RTC one year.  Essential (primary) hypertension blood pressure control important in reducing the progression of atherosclerotic disease. On appropriate oral medications.     Hyperlipidemia lipid control important in reducing the progression of atherosclerotic disease. Continue statin therapy  Leotis Pain, MD  02/14/2023 3:07 PM    This note was created with Dragon medical transcription system.  Any errors from dictation are purely unintentional

## 2023-02-21 ENCOUNTER — Ambulatory Visit (INDEPENDENT_AMBULATORY_CARE_PROVIDER_SITE_OTHER): Payer: Medicare Other | Admitting: Dermatology

## 2023-02-21 VITALS — BP 153/94 | HR 94

## 2023-02-21 DIAGNOSIS — Z86006 Personal history of melanoma in-situ: Secondary | ICD-10-CM

## 2023-02-21 DIAGNOSIS — L820 Inflamed seborrheic keratosis: Secondary | ICD-10-CM

## 2023-02-21 DIAGNOSIS — Z85828 Personal history of other malignant neoplasm of skin: Secondary | ICD-10-CM

## 2023-02-21 DIAGNOSIS — Z1283 Encounter for screening for malignant neoplasm of skin: Secondary | ICD-10-CM

## 2023-02-21 DIAGNOSIS — D229 Melanocytic nevi, unspecified: Secondary | ICD-10-CM

## 2023-02-21 DIAGNOSIS — L649 Androgenic alopecia, unspecified: Secondary | ICD-10-CM | POA: Diagnosis not present

## 2023-02-21 DIAGNOSIS — D1801 Hemangioma of skin and subcutaneous tissue: Secondary | ICD-10-CM

## 2023-02-21 DIAGNOSIS — L821 Other seborrheic keratosis: Secondary | ICD-10-CM

## 2023-02-21 DIAGNOSIS — L814 Other melanin hyperpigmentation: Secondary | ICD-10-CM

## 2023-02-21 DIAGNOSIS — L409 Psoriasis, unspecified: Secondary | ICD-10-CM | POA: Diagnosis not present

## 2023-02-21 MED ORDER — MINOXIDIL 2.5 MG PO TABS: ORAL_TABLET | ORAL | 2 refills | Status: AC

## 2023-02-21 NOTE — Patient Instructions (Addendum)
Doses of minoxidil for hair loss are considered 'low dose'. This is because the doses used for hair loss are much lower than the doses which are used for conditions such as high blood pressure (hypertension). The doses used for hypertension are 10-40mg  per day.  Side effects are uncommon at the low doses (up to 2.5 mg/day) used to treat hair loss. Potential side effects, more commonly seen at higher doses, include: Increase in hair growth (hypertrichosis) elsewhere on face and body Temporary hair shedding upon starting medication which may last up to 4 weeks Ankle swelling, fluid retention, rapid weight gain more than 5 pounds Low blood pressure and feeling lightheaded or dizzy when standing up quickly Fast or irregular heartbeat Headaches      Basic OTC daily skin care regimen to prevent photoaging:   Recommend facial moisturizer with sunscreen SPF 30 every morning (OTC brands include CeraVe AM, Neutrogena, Eucerin, Cetaphil, Aveeno, La Roche Posay).  Can also apply a topical Vit C serum which is an antioxidant (OTC brands include CeraVe, La Roche Posay, and The Ordinary) underneath sunscreen in morning. If you are outside during the day in the summer for extended periods, especially swimming and/or sweating, make sure you apply a water resistant facial sunscreen lotion spf 30 or higher.   At night recommend a cream with retinol (a vitamin A derivative which stimulates collagen production) like CeraVe skin renewing retinol serum or ROC retinol correxion cream or Neutrogena rapid wrinkle repair cream. Retinol may cause skin irritation in people with sensitive skin.  Can use it every other day and/or apply on top of a hyaluronic acid (HA) moisturizer/serum (Neutrogena Hydroboost water cream) if better tolerated that way.  Retinol may also help with lightening brown spots.   Our office sells high quality, medically tested skin care lines such as Elta MD sunscreens (with Zinc), and Alastin skin care  products, which are very effective in treating photoaging. The Alastin line includes cosmeceutical grade Vit.C serum, HA serum, Elastin stimulating moisturizers/serums, lightening serum, and sunscreens.  If you want prescription treatment, then you would need an appointment (Rx tretinoin and fade creams, Botox, filler injections, laser treatments, etc.) These prescriptions and procedures are not covered by insurance but work very well.     Seborrheic Keratosis  What causes seborrheic keratoses? Seborrheic keratoses are harmless, common skin growths that first appear during adult life.  As time goes by, more growths appear.  Some people may develop a large number of them.  Seborrheic keratoses appear on both covered and uncovered body parts.  They are not caused by sunlight.  The tendency to develop seborrheic keratoses can be inherited.  They vary in color from skin-colored to gray, brown, or even black.  They can be either smooth or have a rough, warty surface.   Seborrheic keratoses are superficial and look as if they were stuck on the skin.  Under the microscope this type of keratosis looks like layers upon layers of skin.  That is why at times the top layer may seem to fall off, but the rest of the growth remains and re-grows.    Treatment Seborrheic keratoses do not need to be treated, but can easily be removed in the office.  Seborrheic keratoses often cause symptoms when they rub on clothing or jewelry.  Lesions can be in the way of shaving.  If they become inflamed, they can cause itching, soreness, or burning.  Removal of a seborrheic keratosis can be accomplished by freezing, burning, or surgery.  If any spot bleeds, scabs, or grows rapidly, please return to have it checked, as these can be an indication of a skin cancer.     Recommend starting moisturizer with exfoliant (Urea, Salicylic acid, or Lactic acid) one to two times daily to help smooth rough and bumpy skin.  OTC options include  Cetaphil Rough and Bumpy lotion (Urea), Eucerin Roughness Relief lotion or spot treatment cream (Urea), CeraVe SA lotion/cream for Rough and Bumpy skin (Sal Acid), [place] Bond Rough and Bumpy cream (Sal Acid), and AmLactin 12% lotion/cream (Lactic Acid).  If applying in morning, also apply sunscreen to sun-exposed areas, since these exfoliating moisturizers can increase sensitivity to sun.      Cryotherapy Aftercare  Wash gently with soap and water everyday.   Apply Vaseline and Band-Aid daily until healed.     Due to recent changes in healthcare laws, you may see results of your pathology and/or laboratory studies on MyChart before the doctors have had a chance to review them. We understand that in some cases there may be results that are confusing or concerning to you. Please understand that not all results are received at the same time and often the doctors may need to interpret multiple results in order to provide you with the best plan of care or course of treatment. Therefore, we ask that you please give [place] 2 business days to thoroughly review all your results before contacting the office for clarification. Should we see a critical lab result, you will be contacted sooner.   If You Need Anything After Your Visit  If you have any questions or concerns for your doctor, please call our main line at [phone] and press option 4 to reach your doctor's medical assistant. If no one answers, please leave a voicemail as directed and we will return your call as soon as possible. Messages left after 4 pm will be answered the following business day.   You may also send [place] a message via [place]. We typically respond to MyChart messages within 1-2 business days.  For prescription refills, please ask your pharmacy to contact our office. Our fax number is [phone].  If you have an urgent issue when the clinic is closed that cannot wait until the next business day, you can page your doctor at the  number below.    Please note that while we do our best to be available for urgent issues outside of office hours, we are not available 24/7.   If you have an urgent issue and are unable to reach [place], you may choose to seek medical care at your doctor's office, retail clinic, urgent care center, or emergency room.  If you have a medical emergency, please immediately call 911 or go to the emergency department.  Pager Numbers  - Dr. Nehemiah Massed: [phone]  - Dr. Laurence Ferrari: [phone]  - Dr. Nicole Kindred: [phone]  In the event of inclement weather, please call our main line at [phone] for an update on the status of any delays or closures.  Dermatology Medication Tips: Please keep the boxes that topical medications come in in order to help keep track of the instructions about where and how to use these. Pharmacies typically print the medication instructions only on the boxes and not directly on the medication tubes.   If your medication is too expensive, please contact our office at [phone] option 4 or send [place] a message through [place].   We are unable to tell what your co-pay for medications will be  in advance as this is different depending on your insurance coverage. However, we may be able to find a substitute medication at lower cost or fill out paperwork to get insurance to cover a needed medication.   If a prior authorization is required to get your medication covered by your insurance company, please allow [place] 1-2 business days to complete this process.  Drug prices often vary depending on where the prescription is filled and some pharmacies may offer cheaper prices.  The website www.goodrx.com contains coupons for medications through different pharmacies. The prices here do not account for what the cost may be with help from insurance (it may be cheaper with your insurance), but the website can give you the price if you did not use any insurance.  - You can print the associated  coupon and take it with your prescription to the pharmacy.  - You may also stop by our office during regular business hours and pick up a GoodRx coupon card.  - If you need your prescription sent electronically to a different pharmacy, notify our office through [facility] or by phone at [phone] option 4.     Si Usted Necesita Algo Despus de Su Visita  Tambin puede enviarnos un mensaje a travs de Pharmacist, community. Por lo general respondemos a los mensajes de MyChart en el transcurso de 1 a 2 das hbiles.  Para renovar recetas, por favor pida a su farmacia que se ponga en contacto con nuestra oficina. Harland Dingwall de fax es [place] [phone].  Si tiene un asunto urgente cuando la clnica est cerrada y que no puede esperar hasta el siguiente da hbil, puede llamar/localizar a su doctor(a) al nmero que aparece a continuacin.   Por favor, tenga en cuenta que aunque hacemos todo lo posible para estar disponibles para asuntos urgentes fuera del horario de [place], no estamos disponibles las 24 horas del da, los 7 das de la [place].   Si tiene un problema urgente y no puede comunicarse con nosotros, puede optar por buscar atencin mdica  en el consultorio de su doctor(a), en una clnica privada, en un centro de atencin urgente o en una sala de emergencias.  Si tiene Engineering geologist, por favor llame inmediatamente al 911 o vaya a la sala de emergencias.  Nmeros de bper  - Dr. Nehemiah Massed: [phone]  - Dra. Moye: [phone]  - Dra. Nicole Kindred: [phone]  En caso de inclemencias del [place], por favor llame a Johnsie Kindred principal al [phone] para una actualizacin sobre el [place] de cualquier retraso o cierre.  Consejos para la medicacin en dermatologa: Por favor, guarde las cajas en las que vienen los medicamentos de uso tpico para ayudarle a seguir las instrucciones sobre dnde y cmo usarlos. Las farmacias generalmente imprimen las instrucciones del  medicamento slo en las cajas y no directamente en los tubos del [place].   Si su medicamento es muy caro, por favor, pngase en contacto con Zigmund Daniel llamando al [phone] y presione la opcin 4 o envenos un mensaje a travs de Pharmacist, community.   No podemos decirle cul ser su copago por los medicamentos por adelantado ya que esto es diferente dependiendo de la cobertura de su seguro. Sin embargo, es posible que podamos encontrar un medicamento sustituto a Electrical engineer un formulario para que el seguro cubra el medicamento que se considera necesario.   Si se requiere una autorizacin previa para que su compaa de seguros [place] su medicamento, por favor permtanos de 1 a  2 das hbiles para completar este proceso.  Los precios de los medicamentos varan con frecuencia dependiendo del lugar de dnde se surte la receta y alguna farmacias pueden ofrecer precios ms baratos.  El sitio web www.goodrx.com tiene cupones para medicamentos de diferentes farmacias. Los precios aqu no tienen en cuenta lo que podra costar con la ayuda del seguro (puede ser ms barato con su seguro), pero el sitio web puede darle el precio si no utiliz ningn seguro.  - Puede imprimir el cupn correspondiente y llevarlo con su receta a la farmacia.  - Tambin puede pasar por nuestra oficina durante el horario de atencin regular y recoger una tarjeta de cupones de GoodRx.  - Si necesita que su receta se enve electrnicamente a una farmacia diferente, informe a nuestra oficina a travs de MyChart de [place] o por telfono llamando al [phone] y presione la opcin 4.  

## 2023-02-21 NOTE — Progress Notes (Signed)
Follow-Up Visit   Subjective  Carol Carey is a 72 y.o. female who presents for the following: Annual Exam. 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 The patient presents for Total-Body Skin Exam (TBSE) for skin cancer screening and mole check.  The patient has spots, moles and lesions to be evaluated, some may be new or changing and the patient has concerns that these could be cancer.  Some spots on body have been itchy and irritated.  She also has hair thinning and uses OTC shampoo for hair loss and takes biotin.  Husband with patient   The following portions of the chart were reviewed this encounter and updated as appropriate:       Review of Systems:  No other skin or systemic complaints except as noted in HPI or Assessment and Plan.  Objective  Well appearing patient in no apparent distress; mood and affect are within normal limits.  A full examination was performed including scalp, head, eyes, ears, nose, lips, neck, chest, axillae, abdomen, back, buttocks, bilateral upper extremities, bilateral lower extremities, hands, feet, fingers, toes, fingernails, and toenails. All findings within normal limits unless otherwise noted below.  right posterior shoulder x 5, right upper arm x 1, lower neck x7, right popliteal x 1, left popliteal x 1, left lateral eye x 1 (15) Stuck-on, waxy, tan-brown papule  --Discussed benign etiology and prognosis.   left elbow light pink scaly plaque  scalp Diffuse thinning of the crown and widening of the midline part with retention of the frontal hairline - Reviewed progressive nature and prognosis.              Assessment & Plan  Inflamed seborrheic keratosis (15) right posterior shoulder x 5, right upper arm x 1, lower neck x7, right popliteal x 1, left popliteal x 1, left lateral eye x 1  Symptomatic, irritating, patient would like treated.   Destruction of lesion - right  posterior shoulder x 5, right upper arm x 1, lower neck x7, right popliteal x 1, left popliteal x 1, left lateral eye x 1  Destruction method: cryotherapy   Informed consent: discussed and consent obtained   Lesion destroyed using liquid nitrogen: Yes   Region frozen until ice ball extended beyond lesion: Yes   Outcome: patient tolerated procedure well with no complications   Post-procedure details: wound care instructions given   Additional details:  Prior to procedure, discussed risks of blister formation, small wound, skin dyspigmentation, or rare scar following cryotherapy. Recommend Vaseline ointment to treated areas while healing.   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 Condition is symptomatic/ bothersome to patient. Not currently at goal.    Psoriasis is a chronic non-curable, but treatable genetic/hereditary disease that may have other systemic features affecting other organ systems such as joints (Psoriatic Arthritis). It is associated with an increased risk of inflammatory bowel disease, heart disease, non-alcoholic fatty liver disease, and depression.    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 Condition is symptomatic/ bothersome to patient. Not currently at goal.   Female Androgenic Alopecia is a chronic condition related to genetics and/or  hormonal changes.  In women androgenetic alopecia is commonly associated with menopause but may occur any time after puberty.  It causes hair thinning primarily on the crown with widening of the part and temporal hairline recession.  Can use OTC Rogaine (minoxidil) 5% solution/foam as directed.  Oral  treatments in female patients who have no contraindication may include : - Low dose oral minoxidil 1.25 - 5mg  daily - Spironolactone 50 - 100mg  bid - Finasteride 2.5 - 5 mg daily Adjunctive therapies include: - Low Level Laser Light Therapy (LLLT) - Platelet-rich plasma injections (PRP) - Hair Transplants or scalp reduction   Start Minoxidil 2.5 mg take 1/2 tablet daily for 1 month, after 1 month may increase to 1 tablet daily if no side effects   Doses of minoxidil for hair loss are considered 'low dose'. This is because the doses used for hair loss are much lower than the doses which are used for conditions such as high blood pressure (hypertension). The doses used for hypertension are 10-40mg  per day.  Side effects are uncommon at the low doses (up to 2.5 mg/day) used to treat hair loss. Potential side effects, more commonly seen at higher doses, include: Increase in hair growth (hypertrichosis) elsewhere on face and body Temporary hair shedding upon starting medication which may last up to 4 weeks Ankle swelling, fluid retention, rapid weight gain more than 5 pounds Low blood pressure and feeling lightheaded or dizzy when standing up quickly Fast or irregular heartbeat Headaches   minoxidil (LONITEN) 2.5 MG tablet - scalp Take 1 tablet daily   Lentigines - Scattered tan macules - Due to sun exposure - Benign-appearing, observe - Recommend daily broad spectrum sunscreen SPF 30+ to sun-exposed areas, reapply every 2 hours as needed. - Call for any changes  Seborrheic Keratoses - Stuck-on, waxy, tan-brown papules and/or plaques  - Benign-appearing - Discussed benign etiology and prognosis. - Observe - Call for any changes  Melanocytic Nevi - Tan-brown and/or pink-flesh-colored symmetric macules and papules - Benign appearing on exam today - Observation - Call clinic for new or changing moles - Recommend daily use of broad spectrum spf 30+ sunscreen to sun-exposed areas.    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  - Call if any new or changing lesions are noted between office visits   History of Melanoma in Situ Left forearm 08/13/2021 - No evidence of recurrence today - Recommend regular full body skin exams - Recommend daily broad spectrum sunscreen SPF 30+ to sun-exposed areas, reapply every 2 hours as needed.  - Call if any new or changing lesions are noted between office visits   Skin cancer screening performed today.   Return in about 6 months (around 08/24/2023) for TBSE hx of Melanoma.  I, Marye Round, CMA, am acting as scribe for Brendolyn Patty, MD .   Documentation: I have reviewed the above documentation for accuracy and completeness, and I agree with the above.  Brendolyn Patty MD

## 2023-02-24 ENCOUNTER — Other Ambulatory Visit: Payer: Self-pay | Admitting: Internal Medicine

## 2023-02-24 DIAGNOSIS — I639 Cerebral infarction, unspecified: Secondary | ICD-10-CM

## 2023-02-24 DIAGNOSIS — Z1231 Encounter for screening mammogram for malignant neoplasm of breast: Secondary | ICD-10-CM

## 2023-02-24 HISTORY — DX: Cerebral infarction, unspecified: I63.9

## 2023-03-01 ENCOUNTER — Other Ambulatory Visit: Payer: Self-pay | Admitting: Orthopedic Surgery

## 2023-03-07 ENCOUNTER — Other Ambulatory Visit: Payer: Self-pay | Admitting: Internal Medicine

## 2023-03-07 DIAGNOSIS — R4182 Altered mental status, unspecified: Secondary | ICD-10-CM

## 2023-03-08 ENCOUNTER — Ambulatory Visit (INDEPENDENT_AMBULATORY_CARE_PROVIDER_SITE_OTHER): Payer: Medicare Other | Admitting: Podiatry

## 2023-03-08 ENCOUNTER — Ambulatory Visit
Admission: RE | Admit: 2023-03-08 | Discharge: 2023-03-08 | Disposition: A | Discharge status: Home / Self Care | Payer: Medicare Other | Source: Ambulatory Visit | Attending: Internal Medicine | Admitting: Internal Medicine

## 2023-03-08 DIAGNOSIS — B351 Tinea unguium: Secondary | ICD-10-CM

## 2023-03-08 DIAGNOSIS — R4182 Altered mental status, unspecified: Secondary | ICD-10-CM | POA: Insufficient documentation

## 2023-03-08 DIAGNOSIS — M79676 Pain in unspecified toe(s): Secondary | ICD-10-CM | POA: Diagnosis not present

## 2023-03-13 ENCOUNTER — Encounter: Payer: Self-pay | Admitting: Orthopedic Surgery

## 2023-03-13 ENCOUNTER — Encounter: Payer: Self-pay | Admitting: Anesthesiology

## 2023-03-13 NOTE — Progress Notes (Signed)
  Subjective:  Patient ID: Carol Carey, female    DOB: 10/17/1951,  MRN: [id number]  Chief Complaint  Patient presents with   Nail Problem    Thick painful toenails, 4 month follow up    72 y.o. female presents with the above complaint. History confirmed with patient.  Nails are thickened elongated, previous debridement was helpful.  Objective:  Physical Exam: warm, good capillary refill, no trophic changes or ulcerative lesions, normal DP and PT pulses, and normal sensory exam. Left Foot: dystrophic yellowed discolored nail plates with subungual debris Right Foot: dystrophic yellowed discolored nail plates with subungual debris   Assessment:   1. Pain due to onychomycosis of toenail      Plan:  Patient was evaluated and treated and all questions answered.   Discussed the etiology and treatment options for the condition in detail with the patient.  Prior nail debridement has been helpful. Recommended debridement of the nails today. Sharp and mechanical debridement performed of all painful and mycotic nails today. Nails debrided in length and thickness using a nail nipper to level of comfort. Discussed treatment options including appropriate shoe gear. Follow up as needed for painful nails.    Return in about 3 months (around 06/07/2023) for RFC.

## 2023-03-21 ENCOUNTER — Ambulatory Visit: Admit: 2023-03-21 | Payer: Medicare Other | Admitting: Orthopedic Surgery

## 2023-03-21 SURGERY — CARPOMETACARPAL (CMC) FUSION OF THUMB
Anesthesia: Choice | Site: Thumb | Laterality: Left

## 2023-03-28 ENCOUNTER — Ambulatory Visit
Admission: RE | Admit: 2023-03-28 | Discharge: 2023-03-28 | Disposition: A | Discharge status: Home / Self Care | Payer: Medicare Other | Source: Ambulatory Visit | Attending: Internal Medicine | Admitting: Internal Medicine

## 2023-03-28 DIAGNOSIS — Z1231 Encounter for screening mammogram for malignant neoplasm of breast: Secondary | ICD-10-CM | POA: Insufficient documentation

## 2023-04-04 ENCOUNTER — Other Ambulatory Visit: Payer: Self-pay | Admitting: Internal Medicine

## 2023-04-04 DIAGNOSIS — R928 Other abnormal and inconclusive findings on diagnostic imaging of breast: Secondary | ICD-10-CM

## 2023-04-04 DIAGNOSIS — N63 Unspecified lump in unspecified breast: Secondary | ICD-10-CM

## 2023-04-10 ENCOUNTER — Ambulatory Visit
Admission: RE | Admit: 2023-04-10 | Discharge: 2023-04-10 | Disposition: A | Discharge status: Home / Self Care | Payer: Medicare Other | Source: Ambulatory Visit | Attending: Internal Medicine | Admitting: Internal Medicine

## 2023-04-10 DIAGNOSIS — R928 Other abnormal and inconclusive findings on diagnostic imaging of breast: Secondary | ICD-10-CM | POA: Diagnosis not present

## 2023-04-10 DIAGNOSIS — N63 Unspecified lump in unspecified breast: Secondary | ICD-10-CM | POA: Diagnosis present

## 2023-06-05 ENCOUNTER — Ambulatory Visit
Admission: RE | Admit: 2023-06-05 | Discharge: 2023-06-05 | Disposition: A | Discharge status: Home / Self Care | Payer: Medicare Other | Source: Ambulatory Visit | Attending: Internal Medicine | Admitting: Internal Medicine

## 2023-06-05 DIAGNOSIS — Z122 Encounter for screening for malignant neoplasm of respiratory organs: Secondary | ICD-10-CM | POA: Diagnosis present

## 2023-06-05 DIAGNOSIS — Z87891 Personal history of nicotine dependence: Secondary | ICD-10-CM | POA: Insufficient documentation

## 2023-06-07 ENCOUNTER — Ambulatory Visit: Payer: Medicare Other | Admitting: Podiatry

## 2023-06-12 ENCOUNTER — Telehealth: Payer: Self-pay | Admitting: Acute Care

## 2023-06-12 DIAGNOSIS — Z87891 Personal history of nicotine dependence: Secondary | ICD-10-CM

## 2023-06-12 DIAGNOSIS — R911 Solitary pulmonary nodule: Secondary | ICD-10-CM

## 2023-06-12 NOTE — Telephone Encounter (Signed)
Review patients LDCT from 06/05/23 with Dr. Sarina Ser. Per Dr. Jayme Cloud, based off the results the patient will need a 3 month follow up LCS CT chest. Called and left VM for patient to call back regarding the results. Impression is below. Will forward to nodule pool to call again.    IMPRESSION: 1. New solid pulmonary nodules, largest measures 7.6 mm in the posterior right upper lobe. Lung-RADS 4A, suspicious. Follow up low-dose chest CT without contrast in 3 months (please use the following order, CT CHEST LCS NODULE FOLLOW-UP W/O CM) is recommended. Alternatively, PET may be considered when there is a solid component 8mm or larger. 2. Coronary artery calcifications, aortic Atherosclerosis (ICD10-I70.0) and Emphysema (ICD10-J43.9).

## 2023-06-13 NOTE — Telephone Encounter (Signed)
Results reviewed with patient by phone, using two patient identifiers.  Atherosclerosis and emphysema, as previously noted. Patient is on statin medication.  New right lung nodule 7.93mm with recommendation to repeat LCS LDCT for nodule in 3 months.  Patient has new 'dry cough' but no recent symptoms of illness or increased chest congestion.  Patient in agreement with follow up LDCT.  She had no questions.  Patient would like to wait to schedule closer to October, as she is having hand surgery next week.  Advised we will call closer to date for scheduling the CT.  Results/plan faxed to PCP.  Order placed for 3 months LDCT follow up for lung nodule.

## 2023-09-05 ENCOUNTER — Ambulatory Visit
Admission: RE | Admit: 2023-09-05 | Discharge: 2023-09-05 | Disposition: A | Discharge status: Home / Self Care | Payer: Medicare Other | Source: Ambulatory Visit | Attending: Acute Care | Admitting: Acute Care

## 2023-09-05 DIAGNOSIS — Z87891 Personal history of nicotine dependence: Secondary | ICD-10-CM | POA: Diagnosis present

## 2023-09-05 DIAGNOSIS — R911 Solitary pulmonary nodule: Secondary | ICD-10-CM | POA: Diagnosis present

## 2023-09-12 ENCOUNTER — Ambulatory Visit: Payer: Medicare Other | Admitting: Dermatology

## 2023-09-12 ENCOUNTER — Encounter: Payer: Self-pay | Admitting: Dermatology

## 2023-09-12 DIAGNOSIS — C4491 Basal cell carcinoma of skin, unspecified: Secondary | ICD-10-CM

## 2023-09-12 DIAGNOSIS — L820 Inflamed seborrheic keratosis: Secondary | ICD-10-CM

## 2023-09-12 DIAGNOSIS — Z1283 Encounter for screening for malignant neoplasm of skin: Secondary | ICD-10-CM

## 2023-09-12 DIAGNOSIS — L409 Psoriasis, unspecified: Secondary | ICD-10-CM

## 2023-09-12 DIAGNOSIS — L821 Other seborrheic keratosis: Secondary | ICD-10-CM

## 2023-09-12 DIAGNOSIS — Z85828 Personal history of other malignant neoplasm of skin: Secondary | ICD-10-CM

## 2023-09-12 DIAGNOSIS — D492 Neoplasm of unspecified behavior of bone, soft tissue, and skin: Secondary | ICD-10-CM | POA: Diagnosis not present

## 2023-09-12 DIAGNOSIS — D1801 Hemangioma of skin and subcutaneous tissue: Secondary | ICD-10-CM

## 2023-09-12 DIAGNOSIS — L649 Androgenic alopecia, unspecified: Secondary | ICD-10-CM

## 2023-09-12 DIAGNOSIS — C44311 Basal cell carcinoma of skin of nose: Secondary | ICD-10-CM

## 2023-09-12 DIAGNOSIS — D485 Neoplasm of uncertain behavior of skin: Secondary | ICD-10-CM

## 2023-09-12 DIAGNOSIS — W908XXA Exposure to other nonionizing radiation, initial encounter: Secondary | ICD-10-CM

## 2023-09-12 DIAGNOSIS — Z86006 Personal history of melanoma in-situ: Secondary | ICD-10-CM

## 2023-09-12 DIAGNOSIS — D229 Melanocytic nevi, unspecified: Secondary | ICD-10-CM

## 2023-09-12 DIAGNOSIS — L814 Other melanin hyperpigmentation: Secondary | ICD-10-CM

## 2023-09-12 DIAGNOSIS — L578 Other skin changes due to chronic exposure to nonionizing radiation: Secondary | ICD-10-CM

## 2023-09-12 HISTORY — DX: Basal cell carcinoma of skin, unspecified: C44.91

## 2023-09-12 MED ORDER — FINASTERIDE 5 MG PO TABS: 5.0000 mg | ORAL_TABLET | Freq: Every day | ORAL | 5 refills | Status: DC

## 2023-09-12 NOTE — Patient Instructions (Addendum)
Cryotherapy Aftercare  Wash gently with soap and water everyday.   Apply Vaseline and Band-Aid daily until healed.   Start finasteride 5 mg taking 1/2 tablet daily.  If tolerating well after 1 month may increase to 1 whole pill daily.   Can continue to use flucinonide cream 1-2 times daily to affected areas of psoriasis at body until improved.   Wound Care Instructions  Cleanse wound gently with soap and water once a day then pat dry with clean gauze. Apply a thin coat of Petrolatum (petroleum jelly, "Vaseline") over the wound (unless you have an allergy to this). We recommend that you use a new, sterile tube of Vaseline. Do not pick or remove scabs. Do not remove the yellow or white "healing tissue" from the base of the wound.  Cover the wound with fresh, clean, nonstick gauze and secure with paper tape. You may use Band-Aids in place of gauze and tape if the wound is small enough, but would recommend trimming much of the tape off as there is often too much. Sometimes Band-Aids can irritate the skin.  You should call the office for your biopsy report after 1 week if you have not already been contacted.  If you experience any problems, such as abnormal amounts of bleeding, swelling, significant bruising, significant pain, or evidence of infection, please call the office immediately.  FOR ADULT SURGERY PATIENTS: If you need something for pain relief you may take 1 extra strength Tylenol (acetaminophen) AND 2 Ibuprofen (200mg  each) together every 4 hours as needed for pain. (do not take these if you are allergic to them or if you have a reason you should not take them.) Typically, you may only need pain medication for 1 to 3 days.     Melanoma ABCDEs  Melanoma is the most dangerous type of skin cancer, and is the leading cause of death from skin disease.  You are more likely to develop melanoma if you: Have light-colored skin, light-colored eyes, or red or blond hair Spend a lot of time in  the sun Tan regularly, either outdoors or in a tanning bed Have had blistering sunburns, especially during childhood Have a close family member who has had a melanoma Have atypical moles or large birthmarks  Early detection of melanoma is key since treatment is typically straightforward and cure rates are extremely high if we catch it early.   The first sign of melanoma is often a change in a mole or a new dark spot.  The ABCDE system is a way of remembering the signs of melanoma.  A for asymmetry:  The two halves do not match. B for border:  The edges of the growth are irregular. C for color:  A mixture of colors are present instead of an even brown color. D for diameter:  Melanomas are usually (but not always) greater than 6mm - the size of a pencil eraser. E for evolution:  The spot keeps changing in size, shape, and color.  Please check your skin once per month between visits. You can use a small mirror in front and a large mirror behind you to keep an eye on the back side or your body.   If you see any new or changing lesions before your next follow-up, please call to schedule a visit.  Please continue daily skin protection including broad spectrum sunscreen SPF 30+ to sun-exposed areas, reapplying every 2 hours as needed when you're outdoors.    Due to recent changes in healthcare  laws, you may see results of your pathology and/or laboratory studies on MyChart before the doctors have had a chance to review them. We understand that in some cases there may be results that are confusing or concerning to you. Please understand that not all results are received at the same time and often the doctors may need to interpret multiple results in order to provide you with the best plan of care or course of treatment. Therefore, we ask that you please give [place] 2 business days to thoroughly review all your results before contacting the office for clarification. Should we see a critical lab result, you  will be contacted sooner.   If You Need Anything After Your Visit  If you have any questions or concerns for your doctor, please call our main line at [phone] and press option 4 to reach your doctor's medical assistant. If no one answers, please leave a voicemail as directed and we will return your call as soon as possible. Messages left after 4 pm will be answered the following business day.   You may also send [place] a message via MyChart. We typically respond to MyChart messages within 1-2 business days.  For prescription refills, please ask your pharmacy to contact our office. Our fax number is [phone].  If you have an urgent issue when the clinic is closed that cannot wait until the next business day, you can page your doctor at the number below.    Please note that while we do our best to be available for urgent issues outside of office hours, we are not available 24/7.   If you have an urgent issue and are unable to reach [place], you may choose to seek medical care at your doctor's office, retail clinic, urgent care center, or emergency room.  If you have a medical emergency, please immediately call 911 or go to the emergency department.  Pager Numbers  - Dr. Gwen Pounds: [phone]  - Dr. Roseanne Reno: [phone]  - Dr. Katrinka Blazing: [phone]   In the event of inclement weather, please call our main line at [phone] for an update on the status of any delays or closures.  Dermatology Medication Tips: Please keep the boxes that topical medications come in in order to help keep track of the instructions about where and how to use these. Pharmacies typically print the medication instructions only on the boxes and not directly on the medication tubes.   If your medication is too expensive, please contact our office at [phone] option 4 or send [place] a message through MyChart.   We are unable to tell what your co-pay for medications will be in advance as this is different  depending on your insurance coverage. However, we may be able to find a substitute medication at lower cost or fill out paperwork to get insurance to cover a needed medication.   If a prior authorization is required to get your medication covered by your insurance company, please allow [place] 1-2 business days to complete this process.  Drug prices often vary depending on where the prescription is filled and some pharmacies may offer cheaper prices.  The website www.goodrx.com contains coupons for medications through different pharmacies. The prices here do not account for what the cost may be with help from insurance (it may be cheaper with your insurance), but the website can give you the price if you did not use any insurance.  - You can print the associated coupon and take it with your prescription to the  pharmacy.  - You may also stop by our office during regular business hours and pick up a GoodRx coupon card.  - If you need your prescription sent electronically to a different pharmacy, notify our office through [facility] or by phone at [phone] option 4.

## 2023-09-12 NOTE — Progress Notes (Signed)
Follow-Up Visit   Subjective  Carol Carey is a 72 y.o. female who presents for the following: Skin Cancer Screening and Full Body Skin Exam  The patient presents for Total-Body Skin Exam (TBSE) for skin cancer screening and mole check. The patient has spots, moles and lesions to be evaluated, some may be new or changing and the patient may have concern these could be cancer.  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 Patient currently uses fluocinonide and desonide. Scalp is very itchy. Patient was on Minoxidil for hair loss but had some hair growth at face and swelling at lower legs and ankles so she has discontinued. Swelling resolved once discontinued.   The following portions of the chart were reviewed this encounter and updated as appropriate: medications, allergies, medical history  Review of Systems:  No other skin or systemic complaints except as noted in HPI or Assessment and Plan.  Objective  Well appearing patient in no apparent distress; mood and affect are within normal limits.  A full examination was performed including scalp, head, eyes, ears, nose, lips, neck, chest, axillae, abdomen, back, buttocks, bilateral upper extremities, bilateral lower extremities, hands, feet, fingers, toes, fingernails, and toenails. All findings within normal limits unless otherwise noted below.   Relevant physical exam findings are noted in the Assessment and Plan.  R occipital hairline x 1, crown x 1, L cheek x 1 (3) Erythematous stuck-on, waxy papule  Left inferior nasal ala 4 mm flesh papule Nevus r/o BCC       Assessment & Plan   SKIN CANCER SCREENING PERFORMED TODAY.  ACTINIC DAMAGE - Chronic condition, secondary to cumulative UV/sun exposure - diffuse scaly erythematous macules with underlying dyspigmentation - Recommend daily broad spectrum sunscreen SPF 30+ to sun-exposed areas, reapply every 2 hours as needed.  - Staying in the shade or  wearing long sleeves, sun glasses (UVA+UVB protection) and wide brim hats (4-inch brim around the entire circumference of the hat) are also recommended for sun protection.  - Call for new or changing lesions.  LENTIGINES, SEBORRHEIC KERATOSES, HEMANGIOMAS - Benign normal skin lesions - Benign-appearing - Call for any changes  MELANOCYTIC NEVI - Tan-brown and/or pink-flesh-colored symmetric macules and papules - Benign appearing on exam today - Observation - Call clinic for new or changing moles - Recommend daily use of broad spectrum spf 30+ sunscreen to sun-exposed areas.   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  - Call if any new or changing lesions are noted between office visits    History of Melanoma in Situ Left forearm 08/13/2021 - No evidence of recurrence today - Recommend regular full body skin exams - Recommend daily broad spectrum sunscreen SPF 30+ to sun-exposed areas, reapply every 2 hours as needed.  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     Chronic and persistent condition with duration or expected duration over one year. Condition is symptomatic/ bothersome to patient. Not currently at goal.    Female Androgenic Alopecia is a chronic condition related to genetics and/or hormonal changes.  In women androgenetic alopecia  is commonly associated with menopause but may occur any time after puberty.  It causes hair thinning primarily on the crown with widening of the part and temporal hairline recession.  Can use OTC Rogaine (minoxidil) 5% solution/foam as directed.  Oral  treatments in female patients who have no contraindication may include : - Low dose oral minoxidil 1.25 - 5mg  daily - Spironolactone 50 - 100mg  bid - Finasteride 2.5 - 5 mg daily Adjunctive therapies include: - Low Level Laser Light Therapy (LLLT) - Platelet-rich plasma injections (PRP) - Hair Transplants or scalp reduction   D/C PO Minoxidil due to side effects Start finasteride 5 mg taking 1/2 tablet daily. If tolerating well after 1 month may increase to 1 whole pill daily.   Inflamed seborrheic keratosis (3) R occipital hairline x 1, crown x 1, L cheek x 1  Symptomatic, irritating, patient would like treated.  Benign-appearing.  Call clinic for new or changing lesions.    Destruction of lesion - R occipital hairline x 1, crown x 1, L cheek x 1 (3)  Destruction method: cryotherapy   Informed consent: discussed and consent obtained   Lesion destroyed using liquid nitrogen: Yes   Region frozen until ice ball extended beyond lesion: Yes   Outcome: patient tolerated procedure well with no complications   Post-procedure details: wound care instructions given   Additional details:  Prior to procedure, discussed risks of blister formation, small wound, skin dyspigmentation, or rare scar following cryotherapy. Recommend Vaseline ointment to treated areas while healing.   Neoplasm of uncertain behavior of skin Left inferior nasal ala  Skin / nail biopsy Type of biopsy: tangential   Informed consent: discussed and consent obtained   Patient was prepped and draped in usual sterile fashion: Area prepped with alcohol. Anesthesia: the lesion was anesthetized in a standard fashion   Anesthetic:  1% lidocaine w/ epinephrine 1-100,000 buffered w/ 8.4% NaHCO3 Instrument used: flexible razor blade   Hemostasis achieved with: pressure, aluminum chloride and electrodesiccation   Outcome: patient tolerated procedure well   Post-procedure details: wound care instructions given   Post-procedure  details comment:  Ointment and small bandage applied  Specimen 1 - Surgical pathology Differential Diagnosis: Nevus r/o BCC  Check Margins: No 4 mm flesh papule   Recommend Mohs at [facility] if positive for [facility]  PSORIASIS Exam: pink scaly plaque at left elbow <1% BSA.  Chronic condition with duration or expected duration over one year. Currently well-controlled.   Psoriasis is a chronic non-curable, but treatable genetic/hereditary disease that may have other systemic features affecting other organ systems such as joints (Psoriatic Arthritis). It is associated with an increased risk of inflammatory bowel disease, heart disease, non-alcoholic fatty liver disease, and depression.  Treatments include light and laser treatments; topical medications; and systemic medications including oral and injectables.  Treatment Plan: Can continue to use flucinonide cream qd/bid aas body prn  Return in about 6 months (around 03/12/2024) for TBSE, Hx MMis, Psoriasis, with Dr. Roseanne Reno.  Anise Salvo, RMA, am acting as scribe for Willeen Niece, MD .   Documentation: I have reviewed the above documentation for accuracy and completeness, and I agree with the above.  Willeen Niece, MD

## 2023-09-15 LAB — SURGICAL PATHOLOGY

## 2023-09-18 ENCOUNTER — Telehealth: Payer: Self-pay

## 2023-09-18 NOTE — Telephone Encounter (Signed)
Left message for patient to call for biopsy results.

## 2023-09-18 NOTE — Telephone Encounter (Signed)
Patient advised and scheduled. Patient prefers [facility] at this time. aw

## 2023-09-18 NOTE — Telephone Encounter (Signed)
-----   Message from Willeen Niece sent at 09/15/2023  7:02 PM EDT ----- 1. Skin, left inferior nasal ala :       BASAL CELL CARCINOMA, NODULAR PATTERN   BCC skin cancer. Recommend Mohs surgery vrs [facility].    [facility]C would leave a round depressed whitish scar about the same size as the original lesion.  It is treated here in office in a procedure we call "scrape and burn".  No further pathology would be performed.  Mid to high 80% cure rate.  Mohs would leave a linear scar and pathology would be done at time of procedure to ensure complete removal.  It has a high 90s% cure rate. We would refer patient to a specialist for Mohs surgery- [place], [place] or [place].    - please call patient

## 2023-09-20 ENCOUNTER — Other Ambulatory Visit: Payer: Self-pay

## 2023-09-20 DIAGNOSIS — Z122 Encounter for screening for malignant neoplasm of respiratory organs: Secondary | ICD-10-CM

## 2023-09-20 DIAGNOSIS — Z87891 Personal history of nicotine dependence: Secondary | ICD-10-CM

## 2023-10-18 ENCOUNTER — Ambulatory Visit: Payer: Medicare Other | Admitting: Dermatology

## 2023-10-18 ENCOUNTER — Encounter: Payer: Self-pay | Admitting: Dermatology

## 2023-10-18 DIAGNOSIS — L409 Psoriasis, unspecified: Secondary | ICD-10-CM | POA: Diagnosis not present

## 2023-10-18 DIAGNOSIS — C44311 Basal cell carcinoma of skin of nose: Secondary | ICD-10-CM | POA: Diagnosis not present

## 2023-10-18 DIAGNOSIS — L820 Inflamed seborrheic keratosis: Secondary | ICD-10-CM

## 2023-10-18 MED ORDER — FLUOCINONIDE 0.05 % EX SOLN: CUTANEOUS | 3 refills | Status: DC

## 2023-10-18 NOTE — Progress Notes (Signed)
Follow-Up Visit   Subjective  Carol Carey is a 72 y.o. 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 Patient presents for [facility]. She also has a possible psoriasis of the scalp, not improving with fluocinonide cream.   Patient accompanied by husband.    The following portions of the chart were reviewed this encounter and updated as appropriate: medications, allergies, medical history  Review of Systems:  No other skin or systemic complaints except as noted in HPI or Assessment and Plan.  Objective  Well appearing patient in no apparent distress; mood and affect are within normal limits.  A focused examination was performed of the following areas: Face  Relevant physical exam findings are noted in the Assessment and Plan.  Left inferior nasal ala Pink biopsy site.   Left Cheek Residual erythematous stuck-on, waxy papule     Assessment & Plan   Basal cell carcinoma of skin of nose Left inferior nasal ala  Destruction of lesion  Destruction method: electrodesiccation and curettage   Informed consent: discussed and consent obtained   Timeout:  patient name, date of birth, surgical site, and procedure verified Anesthesia: the lesion was anesthetized in a standard fashion   Anesthetic:  1% lidocaine w/ epinephrine 1-100,000 local infiltration Curettage performed in three different directions: Yes   Electrodesiccation performed over the curetted area: Yes   Final wound size (cm):  0.6 Hemostasis achieved with:  pressure, aluminum chloride and electrodesiccation Outcome: patient tolerated procedure well with no complications   Post-procedure details: wound care instructions given   Post-procedure details comment:  Ointment and bandage applied.  Biopsy proven BCC, nodular pattern.  Inflamed seborrheic keratosis Left Cheek  Residual. Symptomatic, irritating, patient would like treated.  Destruction of lesion - Left  Cheek  Destruction method: cryotherapy   Informed consent: discussed and consent obtained   Lesion destroyed using liquid nitrogen: Yes   Region frozen until ice ball extended beyond lesion: Yes   Outcome: patient tolerated procedure well with no complications   Post-procedure details: wound care instructions given   Additional details:  Prior to procedure, discussed risks of blister formation, small wound, skin dyspigmentation, or rare scar following cryotherapy. Recommend Vaseline ointment to treated areas while healing.    PSORIASIS Exam: scalp clear today <1% BSA. Pt just washed her hair and it has improved, but still itching  Chronic and persistent condition with duration or expected duration over one year. Condition is improving with treatment but not currently at goal.   Psoriasis is a chronic non-curable, but treatable genetic/hereditary disease that may have other systemic features affecting other organ systems such as joints (Psoriatic Arthritis). It is associated with an increased risk of inflammatory bowel disease, heart disease, non-alcoholic fatty liver disease, and depression.  Treatments include light and laser treatments; topical medications; and systemic medications including oral and injectables.  Treatment Plan: Start fluocinonide solution Apply to AA scalp 1-2 times daily as needed dsp 60 mL 2Rf. Avoid applying to face, groin, and axilla. Use as directed. Long-term use can cause thinning of the skin. Continue fluocinonide cream 1-2 times daily as needed to AA body (elbow).  Recommend OTC Head & Shoulders shampoo 2-3x per week, massage into scalp and let sit 3-5 minutes before rinsing.     Return as scheduled, for TBSE 03/2024.  Wendee Beavers, CMA, am acting as scribe for Willeen Niece, MD .   Documentation: I have reviewed the above documentation for accuracy and  completeness, and I agree with the above.  Willeen Niece, MD

## 2023-10-18 NOTE — Patient Instructions (Addendum)

## 2023-11-06 ENCOUNTER — Other Ambulatory Visit: Payer: Self-pay

## 2023-11-06 MED ORDER — FINASTERIDE 5 MG PO TABS: 5.0000 mg | ORAL_TABLET | Freq: Every day | ORAL | 1 refills | Status: DC

## 2023-11-10 ENCOUNTER — Encounter: Payer: Self-pay | Admitting: Internal Medicine

## 2023-11-15 ENCOUNTER — Other Ambulatory Visit: Payer: Self-pay | Admitting: Dermatology

## 2023-11-15 DIAGNOSIS — L649 Androgenic alopecia, unspecified: Secondary | ICD-10-CM

## 2023-12-05 ENCOUNTER — Encounter: Payer: Self-pay | Admitting: Internal Medicine

## 2023-12-12 ENCOUNTER — Ambulatory Visit: Payer: Medicare Other | Admitting: Certified Registered"

## 2023-12-12 ENCOUNTER — Encounter
Admission: RE | Disposition: A | Discharge status: Home / Self Care | Payer: Self-pay | Source: Home / Self Care | Attending: Internal Medicine

## 2023-12-12 ENCOUNTER — Encounter: Payer: Self-pay | Admitting: Internal Medicine

## 2023-12-12 ENCOUNTER — Other Ambulatory Visit: Payer: Self-pay

## 2023-12-12 ENCOUNTER — Ambulatory Visit
Admission: RE | Admit: 2023-12-12 | Discharge: 2023-12-12 | Disposition: A | Discharge status: Home / Self Care | Payer: Medicare Other | Attending: Internal Medicine | Admitting: Internal Medicine

## 2023-12-12 DIAGNOSIS — K5730 Diverticulosis of large intestine without perforation or abscess without bleeding: Secondary | ICD-10-CM | POA: Diagnosis not present

## 2023-12-12 DIAGNOSIS — K219 Gastro-esophageal reflux disease without esophagitis: Secondary | ICD-10-CM | POA: Insufficient documentation

## 2023-12-12 DIAGNOSIS — Z8673 Personal history of transient ischemic attack (TIA), and cerebral infarction without residual deficits: Secondary | ICD-10-CM | POA: Diagnosis not present

## 2023-12-12 DIAGNOSIS — K621 Rectal polyp: Secondary | ICD-10-CM | POA: Diagnosis not present

## 2023-12-12 DIAGNOSIS — N1831 Chronic kidney disease, stage 3a: Secondary | ICD-10-CM | POA: Insufficient documentation

## 2023-12-12 DIAGNOSIS — I2510 Atherosclerotic heart disease of native coronary artery without angina pectoris: Secondary | ICD-10-CM | POA: Insufficient documentation

## 2023-12-12 DIAGNOSIS — G4730 Sleep apnea, unspecified: Secondary | ICD-10-CM | POA: Insufficient documentation

## 2023-12-12 DIAGNOSIS — Z87891 Personal history of nicotine dependence: Secondary | ICD-10-CM | POA: Insufficient documentation

## 2023-12-12 DIAGNOSIS — Z1211 Encounter for screening for malignant neoplasm of colon: Secondary | ICD-10-CM | POA: Diagnosis present

## 2023-12-12 DIAGNOSIS — J449 Chronic obstructive pulmonary disease, unspecified: Secondary | ICD-10-CM | POA: Insufficient documentation

## 2023-12-12 DIAGNOSIS — I129 Hypertensive chronic kidney disease with stage 1 through stage 4 chronic kidney disease, or unspecified chronic kidney disease: Secondary | ICD-10-CM | POA: Insufficient documentation

## 2023-12-12 HISTORY — DX: Prediabetes: R73.03

## 2023-12-12 HISTORY — PX: POLYPECTOMY: SHX5525

## 2023-12-12 HISTORY — PX: COLONOSCOPY WITH PROPOFOL: SHX5780

## 2023-12-12 HISTORY — DX: Other chronic pain: G89.29

## 2023-12-12 HISTORY — DX: Postlaminectomy syndrome, not elsewhere classified: M96.1

## 2023-12-12 HISTORY — DX: Vitamin D deficiency, unspecified: E55.9

## 2023-12-12 HISTORY — DX: Chronic lymphadenitis, except mesenteric: I88.1

## 2023-12-12 HISTORY — DX: Personal history of adenomatous and serrated colon polyps: Z86.0101

## 2023-12-12 SURGERY — COLONOSCOPY WITH PROPOFOL
Anesthesia: General

## 2023-12-12 MED ORDER — PROPOFOL 500 MG/50ML IV EMUL
INTRAVENOUS | Status: DC | PRN
  Administered 2023-12-12: 135 ug/kg/min via INTRAVENOUS

## 2023-12-12 MED ORDER — SODIUM CHLORIDE 0.9 % IV SOLN: INTRAVENOUS | Status: DC

## 2023-12-12 MED ORDER — PROPOFOL 10 MG/ML IV BOLUS
INTRAVENOUS | Status: DC | PRN
  Administered 2023-12-12: 30 mg via INTRAVENOUS
  Administered 2023-12-12: 20 mg via INTRAVENOUS

## 2023-12-12 NOTE — H&P (Signed)
 Outpatient short stay form Pre-procedure 12/12/2023 10:28 AM Carol Carey, M.D.  Primary Physician: Carol Carey, M.D.  Reason for visit:  Personal history of adenomatous colon polyps  History of present illness:  Carol Carey is a 73 yo female with history of HTN, HLD, hypothyroid disease, obesity, COPD, (former smoking), prediabetes, GERD, Covid cough, osteoporosis, Vit D insufficiency, lymphedema, neuropathy in her lower extremities, and recent CVA on Plavix. She denies OSA now after re testing. who returns for d/up colonoscopy for PH colon polyps.   Patient reports that she had hand surgery in July and took narcotics which caused mild constipation. She placed herself on a stool softener at bedtime with resolution. She is off the narcotics. She still takes the stool softener at bedtime and reports a good formed stool every day. She has noted no lower GI complaints. No weight loss, anorexia, abdominal pain, or hematochezia.   /10/2013 [facility]: We have requested records and it showed 2 polyps removed. Pathology: SSA.   -06/19/2018: Colonoscopy: Personal history of colon polyps: Impression: Small hyperplastic sessile polyp was found rectosigmoid. This was at the edge of the rectosigmoid, multiple small polyps were seen and not recoverable.    Current Facility-Administered Medications:    0.9 %  sodium chloride  infusion, , Intravenous, Continuous, [place], Deleah Tison K, MD, Last Rate: 20 mL/hr at 12/12/23 0939, New Bag at 12/12/23 0939  Medications Prior to Admission  Medication Sig Dispense Refill Last Dose/Taking   amitriptyline (ELAVIL) 25 MG tablet Take 25 mg by mouth at bedtime.   12/11/2023   atenolol  (TENORMIN ) 50 MG tablet Take 50 mg by mouth at bedtime.    12/12/2023 Morning   atorvastatin  (LIPITOR) 20 MG tablet Take 20 mg by mouth daily.    12/12/2023 Morning   CALCIUM -VITAMIN D PO Take 1 tablet by mouth daily.   Past Week   fluticasone-salmeterol (ADVAIR) 250-50 MCG/ACT AEPB  Inhale 1 puff into the lungs in the morning and at bedtime.   Taking   isosorbide mononitrate (IMDUR) 30 MG 24 hr tablet Take 30 mg by mouth daily.   12/12/2023 Morning   levothyroxine  (SYNTHROID ) 100 MCG tablet Take 100 mcg by mouth at bedtime.   12/12/2023 Morning   losartan  (COZAAR ) 100 MG tablet Take 100 mg by mouth daily.    12/12/2023 Morning   chlorthalidone (HYGROTON) 50 MG tablet       clopidogrel (PLAVIX) 75 MG tablet Take 75 mg by mouth daily.   12/06/2023   esomeprazole (NEXIUM) 20 MG capsule Take 20 mg by mouth at bedtime.      finasteride  (PROSCAR ) 5 MG tablet Take 1 tablet (5 mg total) by mouth daily. 90 tablet 1    fluocinonide  (LIDEX ) 0.05 % external solution Apply to affected areas scalp once to twice daily until improved. Avoid applying to face, groin, and axilla. Use as directed. Long-term use can cause thinning of the skin. 60 mL 3    fluticasone (FLONASE) 50 MCG/ACT nasal spray Place 1 spray into the nose daily as needed for allergies.      hydrocortisone 2.5 % cream Apply 1 application topically 2 (two) times daily as needed (hemorrhoids).      minoxidil  (LONITEN ) 2.5 MG tablet Take 1 tablet daily 30 tablet 2    mupirocin ointment (BACTROBAN) 2 % Apply topically daily.      NEOMYCIN -POLYMYXIN-HYDROCORTISONE (CORTISPORIN) 1 % SOLN OTIC solution Apply 1-2 drops to toe BID after soaking 10 mL 1    Potassium 99 MG TABS Take  99 mg by mouth in the morning and at bedtime.      tiZANidine (ZANAFLEX) 2 MG tablet Take 2 mg by mouth 3 (three) times daily.        Allergies  Allergen Reactions   Demerol [Meperidine Hcl] Nausea And Vomiting    Vomiting lasted for 2 days after taking med   Latex Rash    Tapes   Lisinopril Cough   Penicillins Rash   Sulfa Antibiotics Rash     Past Medical History:  Diagnosis Date   Aortic atherosclerosis (HCC)    Arthritis    Ascending aorta dilatation (HCC)    a.) measured 3.9cm by TTE on 06/29/21   Basal cell carcinoma 09/12/2023   Left  inferior nasal ala, ED[facility] 10/18/23   CAD (coronary artery disease)    Carotid artery stenosis    Chronic left-sided low back pain with left-sided sciatica    Colon polyp    COPD (chronic obstructive pulmonary disease) (HCC)    Degenerative joint disease    GERD (gastroesophageal reflux disease)    Hepatic steatosis    Hx of adenomatous colonic polyps    Hyperlipidemia    Hypertension    Hypothyroidism    Lymphadenitis, chronic    Melanoma in situ (HCC) 08/13/2021   L forearm   Osteoporosis    Post laminectomy syndrome    Pre-diabetes    Pulmonary emphysema (HCC)    Seasonal allergies    Skin cancer    lesion removed from nose, BCC, Mohs at [facility]   Sleep apnea    not on nocturnal PAP therapy   Spinal stenosis    Stroke (HCC) 02/24/2023   mini stroke- no dificits   Valvular insufficiency    a.) TTE on 06/29/2021 --> moderate AR, trivial MR/TR, mild PR   Vitamin D insufficiency     Review of systems:  Otherwise negative.    Physical Exam  Gen: Alert, oriented. Appears stated age.  HEENT: [place]/AT. PERRLA. Lungs: CTA, no wheezes. CV: RR nl S1, S2. Abd: soft, benign, no masses. BS+ Ext: No edema. Pulses 2+    Planned procedures: Proceed with colonoscopy. The patient understands the nature of the planned procedure, indications, risks, alternatives and potential complications including but not limited to bleeding, infection, perforation, damage to internal organs and possible oversedation/side effects from anesthesia. The patient agrees and gives consent to proceed.  Please refer to procedure notes for findings, recommendations and patient disposition/instructions.     Carol Carey, M.D. Gastroenterology 12/12/2023  10:28 AM

## 2023-12-12 NOTE — Interval H&P Note (Signed)
 History and Physical Interval Note:  12/12/2023 10:30 AM  Carol Carey  has presented today for surgery, with the diagnosis of Z86.0101 (ICD-10-CM) - Hx of adenomatous colonic polyps.  The various methods of treatment have been discussed with the patient and family. After consideration of risks, benefits and other options for treatment, the patient has consented to  Procedure(s): COLONOSCOPY WITH PROPOFOL  (N/A) as a surgical intervention.  The patient's history has been reviewed, patient examined, no change in status, stable for surgery.  I have reviewed the patient's chart and labs.  Questions were answered to the patient's satisfaction.     [place], Carol Carey

## 2023-12-12 NOTE — Anesthesia Preprocedure Evaluation (Signed)
 Anesthesia Evaluation  Patient identified by MRN, date of birth, ID band Patient awake    Reviewed: Allergy & Precautions, NPO status , Patient's Chart, lab work & pertinent test results  History of Anesthesia Complications Negative for: history of anesthetic complications  Airway Mallampati: IV   Neck ROM: Full    Dental  (+) Missing   Pulmonary sleep apnea , COPD, former smoker (quit 2021)   Pulmonary exam normal breath sounds clear to auscultation       Cardiovascular hypertension, + CAD  Normal cardiovascular exam Rhythm:Regular Rate:Normal     Neuro/Psych Pituitary adenoma CVA (02/2023 on Plavix; no residual deficits)    GI/Hepatic ,GERD  ,,  Endo/Other  Hypothyroidism  Obesity   Renal/GU Renal disease (stage III CKD)     Musculoskeletal   Abdominal   Peds  Hematology negative hematology ROS (+)   Anesthesia Other Findings Cardiology note 06/19/23:  73 y.o. female with No diagnosis found. History of CVA Obesity Hypertension Hyperlipidemia GERD Pulmonary emphysema Obstructive sleep apnea Lymphedema  Plan   CVA unclear etiology recommend further evaluation continue Plavix aspirin statin follow-up with neurology Hyperlipidemia continue Lipitor therapy for lipid management Hypertension reasonably controlled continue atenolol  losartan  minoxidil  GERD continue Nexium therapy for reflux type symptoms Obstructive sleep apnea by history recommend sleep study CPAP weight loss Smoking by history patient quit continue device refrain from tobacco abuse Obesity recommend modest weight loss exercise portion control Pulmonary emphysema by history continue inhalers consider follow-up with pulmonary Lymphedema lower extremities recommend support stockings elevation diuretics Obstructive sleep apnea recommend sleep study CPAP weight loss Have the patient follow-up in 3 months    Reproductive/Obstetrics                              Anesthesia Physical Anesthesia Plan  ASA: 3  Anesthesia Plan: General   Post-op Pain Management:    Induction: Intravenous  PONV Risk Score and Plan: 3 and Propofol  infusion, TIVA and Treatment may vary due to age or medical condition  Airway Management Planned: Natural Airway  Additional Equipment:   Intra-op Plan:   Post-operative Plan:   Informed Consent: I have reviewed the patients History and Physical, chart, labs and discussed the procedure including the risks, benefits and alternatives for the proposed anesthesia with the patient or authorized representative who has indicated his/her understanding and acceptance.       Plan Discussed with: CRNA  Anesthesia Plan Comments: (LMA/GETA backup discussed.  Patient consented for risks of anesthesia including but not limited to:  - adverse reactions to medications - damage to eyes, teeth, lips or other oral mucosa - nerve damage due to positioning  - sore throat or hoarseness - damage to heart, brain, nerves, lungs, other parts of body or loss of life  Informed patient about role of CRNA in peri- and intra-operative care.  Patient voiced understanding.)       Anesthesia Quick Evaluation

## 2023-12-12 NOTE — Op Note (Signed)
 [facility] Gastroenterology Patient Name: Carol Carey Procedure Date: 12/12/2023 10:32 AM MRN: [id number] Account #: [id number] Date of Birth: May 22, 1951 Admit Type: Outpatient Age: 73 Room: [facility] ENDO ROOM 3 Gender: Female Note Status: Finalized Instrument Name: Arvis [id number] Procedure:             Colonoscopy Indications:           Surveillance: Personal history of adenomatous polyps                         on last colonoscopy > 5 years ago Providers:             Japhet Morgenthaler K. Aundria MD, MD Referring MD:          Reyes BIRCH. Auston, MD (Referring MD) Medicines:             Propofol  per Anesthesia Complications:         No immediate complications. Estimated blood loss:                         Minimal. Procedure:             Pre-Anesthesia Assessment:                        - The risks and benefits of the procedure and the                         sedation options and risks were discussed with the                         patient. All questions were answered and informed                         consent was obtained.                        - Patient identification and proposed procedure were                         verified prior to the procedure by the nurse. The                         procedure was verified in the procedure room.                        - ASA Grade Assessment: III - A patient with severe                         systemic disease.                        - After reviewing the risks and benefits, the patient                         was deemed in satisfactory condition to undergo the                         procedure.                        After obtaining  informed consent, the colonoscope was                         passed under direct vision. Throughout the procedure,                         the patient's blood pressure, pulse, and oxygen                         saturations were monitored continuously. The                         Colonoscope was  introduced through the anus and                         advanced to the the cecum, identified by appendiceal                         orifice and ileocecal valve. The colonoscopy was                         performed without difficulty. The patient tolerated                         the procedure well. The quality of the bowel                         preparation was good. The ileocecal valve, appendiceal                         orifice, and rectum were photographed. Findings:      The perianal and digital rectal examinations were normal. Pertinent       negatives include normal sphincter tone and no palpable rectal lesions.      Two sessile polyps were found in the rectum. The polyps were 3 to 4 mm       in size. These polyps were removed with a cold biopsy forceps. Resection       and retrieval were complete.      Many small-mouthed diverticula were found in the sigmoid colon.      The exam was otherwise without abnormality on direct and retroflexion       views. Impression:            - Two 3 to 4 mm polyps in the rectum, removed with a                         cold biopsy forceps. Resected and retrieved.                        - Diverticulosis in the sigmoid colon.                        - The examination was otherwise normal on direct and                         retroflexion views. Recommendation:        - Patient has a contact number available for  emergencies. The signs and symptoms of potential                         delayed complications were discussed with the patient.                         Return to normal activities tomorrow. Written                         discharge instructions were provided to the patient.                        - Resume previous diet.                        - Continue present medications.                        - If polyps are benign or adenomatous without                         dysplasia, I will advise NO further colonoscopy due to                          advanced age and/or severe comorbidity.                        - Return to GI office PRN.                        - The findings and recommendations were discussed with                         the patient. Procedure Code(s):     --- Professional ---                        [id number], Colonoscopy, flexible; with biopsy, single or                         multiple Diagnosis Code(s):     --- Professional ---                        K57.30, Diverticulosis of large intestine without                         perforation or abscess without bleeding                        D12.8, Benign neoplasm of rectum                        Z86.010, Personal history of colonic polyps CPT copyright 2022 [employer]. All rights reserved. The codes documented in this report are preliminary and upon coder review may  be revised to meet current compliance requirements. [name] MD, MD 12/12/2023 11:00:10 AM This report has been signed electronically. Number of Addenda: 0 Note Initiated On: 12/12/2023 10:32 AM Scope Withdrawal Time: 0 hours 5 minutes 39 seconds  Total Procedure Duration: 0 hours 10 minutes 40 seconds  Estimated Blood Loss:  Estimated  blood loss was minimal.      [facility]

## 2023-12-12 NOTE — Anesthesia Postprocedure Evaluation (Signed)
 Anesthesia Post Note  Patient: Rachele K Pousson  Procedure(s) Performed: COLONOSCOPY WITH PROPOFOL   Patient location during evaluation: PACU Anesthesia Type: General Level of consciousness: awake and alert, oriented and patient cooperative Pain management: pain level controlled Vital Signs Assessment: post-procedure vital signs reviewed and stable Respiratory status: spontaneous breathing, nonlabored ventilation and respiratory function stable Cardiovascular status: blood pressure returned to baseline and stable Postop Assessment: adequate PO intake Anesthetic complications: no   No notable events documented.   Last Vitals:  Vitals:   12/12/23 1057 12/12/23 1109  BP: (!) 121/58 107/62  Pulse: 62   Resp: 18   Temp: 36.5 C   SpO2: 97%     Last Pain:  Vitals:   12/12/23 1118  TempSrc:   PainSc: 0-No pain                 Alfonso Ruths

## 2023-12-12 NOTE — Transfer of Care (Signed)
 Immediate Anesthesia Transfer of Care Note  Patient: Carol Carey  Procedure(s) Performed: COLONOSCOPY WITH PROPOFOL   Patient Location: PACU  Anesthesia Type:General  Level of Consciousness: drowsy  Airway & Oxygen Therapy: Patient Spontanous Breathing and Patient connected to face mask oxygen  Post-op Assessment: Report given to RN, Post -op Vital signs reviewed and stable, and Patient moving all extremities X 4  Post vital signs: Reviewed and stable  Last Vitals:  Vitals Value Taken Time  BP 121/58 12/12/23 1057  Temp 36.5 C 12/12/23 1057  Pulse 64 12/12/23 1100  Resp 18 12/12/23 1057  SpO2 97 % 12/12/23 1100  Vitals shown include unfiled device data.  Last Pain:  Vitals:   12/12/23 1057  TempSrc: Temporal  PainSc: 0-No pain         Complications: No notable events documented.

## 2023-12-12 NOTE — Anesthesia Procedure Notes (Signed)
 Procedure Name: MAC Date/Time: 12/12/2023 10:40 AM  Performed by: Nelle Don, CRNAPre-anesthesia Checklist: Patient identified, Emergency Drugs available, Suction available and Patient being monitored Oxygen Delivery Method: Nasal cannula

## 2023-12-13 ENCOUNTER — Encounter: Payer: Self-pay | Admitting: Internal Medicine

## 2023-12-13 LAB — SURGICAL PATHOLOGY

## 2023-12-27 ENCOUNTER — Ambulatory Visit: Payer: Medicare Other | Admitting: Dermatology

## 2024-01-02 ENCOUNTER — Encounter: Payer: Self-pay | Admitting: Dermatology

## 2024-01-02 NOTE — Addendum Note (Signed)
Encounter addended by: Silvana Newness on: 01/02/2024 4:24 PM  Actions taken: Imaging Exam ended

## 2024-02-12 ENCOUNTER — Ambulatory Visit (INDEPENDENT_AMBULATORY_CARE_PROVIDER_SITE_OTHER): Payer: Medicare Other | Admitting: Dermatology

## 2024-02-12 DIAGNOSIS — L820 Inflamed seborrheic keratosis: Secondary | ICD-10-CM | POA: Diagnosis not present

## 2024-02-12 DIAGNOSIS — Z7189 Other specified counseling: Secondary | ICD-10-CM | POA: Diagnosis not present

## 2024-02-12 DIAGNOSIS — Z85828 Personal history of other malignant neoplasm of skin: Secondary | ICD-10-CM | POA: Diagnosis not present

## 2024-02-12 DIAGNOSIS — L409 Psoriasis, unspecified: Secondary | ICD-10-CM | POA: Diagnosis not present

## 2024-02-12 MED ORDER — FLUOCINONIDE 0.05 % EX SOLN
CUTANEOUS | 3 refills | Status: AC
Start: 2024-02-12 — End: ?

## 2024-02-12 MED ORDER — KETOCONAZOLE 2 % EX SHAM: MEDICATED_SHAMPOO | CUTANEOUS | 5 refills | Status: AC

## 2024-02-12 NOTE — Progress Notes (Signed)
   Follow-Up Visit   Subjective  Carol Carey is a 73 y.o. female who presents for the following: Psoriasis of the scalp. Patient is flared and has itching, scaly bumps. She is using fluocinonide solution and Head & Shoulders, but only helps temporarily. History of BCC of the left inferior nasal ala.   This patient is accompanied in the office by her spouse.   The following portions of the chart were reviewed this encounter and updated as appropriate: medications, allergies, medical history  Review of Systems:  No other skin or systemic complaints except as noted in HPI or Assessment and Plan.  Objective  Well appearing patient in no apparent distress; mood and affect are within normal limits.  Areas Examined: Face, scalp  Relevant exam findings are noted in the Assessment and Plan.    frontal scalp, R post auricular Erythematous stuck-on, waxy papule or plaque  Assessment & Plan   INFLAMED SEBORRHEIC KERATOSIS frontal scalp, R post auricular Symptomatic, irritating, patient would like treated. Will treat on follow-up. Defers today due to upcoming funeral.    HISTORY OF BASAL CELL CARCINOMA OF THE SKIN - No evidence of recurrence today, L inf nasal ala - Recommend regular full body skin exams - Recommend daily broad spectrum sunscreen SPF 30+ to sun-exposed areas, reapply every 2 hours as needed.  - Call if any new or changing lesions are noted between office visits   PSORIASIS  Xerosis on the elbows; violaceous scaly plaque on the left elbow. <1% BSA.  Chronic and persistent condition with duration or expected duration over one year. Condition is improving with treatment but not currently at goal.    Patient denies joint pain  Treatment Plan: Start Ketoconazole 2% shampoo - massage into scalp and let sit several minutes before rinsing dsp 120 mL 5Rf Continue fluocinonide solution once to twice daily to affected areas scalp as needed for itch. Avoid applying to  face, groin, and axilla. Use as directed. Long-term use can cause thinning of the skin. Continue fluocinonide cream once to twice daily to AA body. Avoid applying to face, groin, and axilla. Use as directed. Long-term use can cause thinning of the skin. Pt has at home.    Counseling on psoriasis and coordination of care  psoriasis is a chronic non-curable, but treatable genetic/hereditary disease that may have other systemic features affecting other organ systems such as joints (Psoriatic Arthritis). It is associated with an increased risk of inflammatory bowel disease, heart disease, non-alcoholic fatty liver disease, and depression.  Treatments include light and laser treatments; topical medications; and systemic medications including oral and injectables.  Topical steroids (such as triamcinolone, fluocinolone, fluocinonide, mometasone, clobetasol, halobetasol, betamethasone, hydrocortisone) can cause thinning and lightening of the skin if they are used for too long in the same area. Your physician has selected the right strength medicine for your problem and area affected on the body. Please use your medication only as directed by your physician to prevent side effects.     Return as scheduled, for TBSE.  ICherlyn Labella, CMA, am acting as scribe for Willeen Niece, MD .   Documentation: I have reviewed the above documentation for accuracy and completeness, and I agree with the above.  Willeen Niece, MD

## 2024-02-12 NOTE — Patient Instructions (Addendum)

## 2024-02-13 ENCOUNTER — Ambulatory Visit (INDEPENDENT_AMBULATORY_CARE_PROVIDER_SITE_OTHER): Payer: Medicare Other

## 2024-02-13 ENCOUNTER — Encounter (INDEPENDENT_AMBULATORY_CARE_PROVIDER_SITE_OTHER): Payer: Self-pay | Admitting: Vascular Surgery

## 2024-02-13 ENCOUNTER — Ambulatory Visit (INDEPENDENT_AMBULATORY_CARE_PROVIDER_SITE_OTHER): Payer: Medicare Other | Admitting: Vascular Surgery

## 2024-02-13 VITALS — BP 120/69 | HR 72 | Resp 16 | Wt 174.0 lb

## 2024-02-13 DIAGNOSIS — I6523 Occlusion and stenosis of bilateral carotid arteries: Secondary | ICD-10-CM | POA: Diagnosis not present

## 2024-02-13 DIAGNOSIS — I10 Essential (primary) hypertension: Secondary | ICD-10-CM

## 2024-02-13 DIAGNOSIS — E785 Hyperlipidemia, unspecified: Secondary | ICD-10-CM | POA: Diagnosis not present

## 2024-02-13 NOTE — Progress Notes (Unsigned)
 MRN : [id number]  LABREA ECCLESTON is a 73 y.o. (Apr 05, 1951) female who presents with chief complaint of No chief complaint on file. Marland Kitchen  History of Present Illness: Patient returns today in follow up of ***  Current Outpatient Medications  Medication Sig Dispense Refill   amitriptyline (ELAVIL) 25 MG tablet Take 25 mg by mouth at bedtime.     atenolol (TENORMIN) 50 MG tablet Take 50 mg by mouth at bedtime.      atorvastatin (LIPITOR) 20 MG tablet Take 20 mg by mouth daily.      CALCIUM-VITAMIN D PO Take 1 tablet by mouth daily.     chlorthalidone (HYGROTON) 50 MG tablet      clopidogrel (PLAVIX) 75 MG tablet Take 75 mg by mouth daily.     esomeprazole (NEXIUM) 20 MG capsule Take 20 mg by mouth at bedtime.     finasteride (PROSCAR) 5 MG tablet Take 1 tablet (5 mg total) by mouth daily. 90 tablet 1   fluocinonide (LIDEX) 0.05 % external solution Apply to affected areas scalp once to twice daily until improved. Avoid applying to face, groin, and axilla. Use as directed. Long-term use can cause thinning of the skin. 60 mL 3   fluticasone (FLONASE) 50 MCG/ACT nasal spray Place 1 spray into the nose daily as needed for allergies.     hydrocortisone 2.5 % cream Apply 1 application topically 2 (two) times daily as needed (hemorrhoids).     isosorbide mononitrate (IMDUR) 30 MG 24 hr tablet Take 30 mg by mouth daily.     ketoconazole (NIZORAL) 2 % shampoo Massage into scalp daily and let sit several minutes before rinsing. 120 mL 5   levothyroxine (SYNTHROID) 100 MCG tablet Take 100 mcg by mouth at bedtime.     losartan (COZAAR) 100 MG tablet Take 100 mg by mouth daily.      minoxidil (LONITEN) 2.5 MG tablet Take 1 tablet daily 30 tablet 2   mupirocin ointment (BACTROBAN) 2 % Apply topically daily.     NEOMYCIN-POLYMYXIN-HYDROCORTISONE (CORTISPORIN) 1 % SOLN OTIC solution Apply 1-2 drops to toe BID after soaking 10 mL 1   Potassium 99 MG TABS Take 99 mg by mouth in the morning and at bedtime.      tiZANidine (ZANAFLEX) 2 MG tablet Take 2 mg by mouth 3 (three) times daily.     No current facility-administered medications for this visit.    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-sided sciatica    Colon polyp    COPD (chronic obstructive pulmonary disease) (HCC)    Degenerative joint disease    GERD (gastroesophageal reflux disease)    Hepatic steatosis    Hx of adenomatous colonic polyps    Hyperlipidemia    Hypertension    Hypothyroidism    Lymphadenitis, chronic    Melanoma in situ (HCC) 08/13/2021   L forearm   Osteoporosis    Post laminectomy syndrome    Pre-diabetes    Pulmonary emphysema (HCC)    Seasonal allergies    Skin cancer    lesion removed from nose, BCC, [facility]s at [facility]   Sleep apnea  not on nocturnal PAP therapy   Spinal stenosis    Stroke (HCC) 02/24/2023   "mini stroke"- no dificits   Valvular insufficiency    a.) TTE on 06/29/2021 --> moderate AR, trivial MR/TR, mild PR   Vitamin D insufficiency     Past Surgical History:  Procedure Laterality Date   ABDOMINAL EXPOSURE N/A 09/30/2020   Procedure: ABDOMINAL EXPOSURE;  Surgeon: Annice Needy, MD;  Location: [facility];  Service: Vascular;  Laterality: N/A;   ABDOMINAL HYSTERECTOMY  1993   ANTERIOR AND POSTERIOR SPINAL FUSION N/A 09/30/2020   Procedure: L5-S1 ANTERIOR LUMBAR INTERBODY FUSION, L3-S1 INSTRUMENTATION;  Surgeon: Venetia Night, MD;  Location: [facility];  Service: Neurosurgery;  Laterality: N/A;   ANTERIOR CERVICAL DECOMP/DISCECTOMY FUSION N/A 07/09/2021   Procedure: C4-5 ANTERIOR CERVICAL DECOMPRESSION/DISCECTOMY FUSION 1 LEVEL;  Surgeon: Venetia Night, MD;  Location: [facility];   Service: Neurosurgery;  Laterality: N/A;  schedule as first case   APPENDECTOMY  1974   BACK SURGERY     BREAST BIOPSY Right 02/15/2021   Q clip, [place] bx, 9:30 5cmfn Usual Ductal Hyperplasia, PASH   BREAST BIOPSY Left 02/15/2021   x clip, [place] bx, 1:00 2cmfn, Usual Ductal Hyperplasia, Apocrine Metaplasia, PASH   BREAST EXCISIONAL BIOPSY Right 1985   Benign cycts removed   CATARACT EXTRACTION W/PHACO Left 11/23/2022   Procedure: CATARACT EXTRACTION PHACO AND INTRAOCULAR LENS PLACEMENT (IOC) LEFT MALYUGIN;  Surgeon: Lockie Mola, MD;  Location: [facility];  Service: Ophthalmology;  Laterality: Left;  11.46 1:16.1   CATARACT EXTRACTION W/PHACO Right 12/21/2022   Procedure: CATARACT EXTRACTION PHACO AND INTRAOCULAR LENS PLACEMENT (IOC) RIGHT MALYUGIN 13.45 01:24.5;  Surgeon: Lockie Mola, MD;  Location: [facility];  Service: Ophthalmology;  Laterality: Right;   CESAREAN SECTION  1993   COLONOSCOPY     COLONOSCOPY WITH PROPOFOL N/A 12/12/2023   Procedure: COLONOSCOPY WITH PROPOFOL;  Surgeon: Toledo, Boykin Nearing, MD;  Location: [facility];  Service: Gastroenterology;  Laterality: N/A;   EYE SURGERY     LUMBAR FUSION  2010   Dr. Samuella Cota   LUMBAR FUSION  2013   x 2 Dr. Erma Heritage   POLYPECTOMY  12/12/2023   Procedure: POLYPECTOMY;  Surgeon: Toledo, Boykin Nearing, MD;  Location: [facility];  Service: Gastroenterology;;   TONSILLECTOMY     TUBAL LIGATION  1974   UPPER GI ENDOSCOPY       Social History   Tobacco Use   Smoking status: Former    Current packs/day: 0.00    Average packs/day: 1 pack/day for 48.0 years (48.0 ttl pk-yrs)    Types: Cigarettes    Start date: 02/14/1972    Quit date: 02/14/2020    Years since quitting: 4.0   Smokeless tobacco: Never  Vaping Use   Vaping status: Never Used  Substance Use Topics   Alcohol use: Yes    Alcohol/week: 14.0 standard drinks of alcohol    Types: 14 Glasses of wine per week   Drug use: Not Currently    Types:  Marijuana    Comment: 2018 tried using      Family History  Problem Relation Age of Onset   Hypertension Mother    Heart attack Father    Lung cancer Brother    Breast cancer Neg Hx     Allergies  Allergen Reactions   Demerol [Meperidine Hcl] Nausea And Vomiting    Vomiting lasted for 2 days after taking med   Latex Rash    Tapes   Lisinopril  Cough   Penicillins Rash   Sulfa Antibiotics Rash    REVIEW OF SYSTEMS (Negative unless checked)   Constitutional: [] Weight loss  [] Fever  [] Chills Cardiac: [] Chest pain   [] Chest pressure   [] Palpitations   [] Shortness of breath when laying flat   [] Shortness of breath at rest   [] Shortness of breath with exertion. Vascular:  [] Pain in legs with walking   [] Pain in legs at rest   [] Pain in legs when laying flat   [] Claudication   [] Pain in feet when walking  [] Pain in feet at rest  [] Pain in feet when laying flat   [] History of DVT   [] Phlebitis   [] Swelling in legs   [] Varicose veins   [] Non-healing ulcers Pulmonary:   [] Uses home oxygen   [] Productive cough   [] Hemoptysis   [] Wheeze  [] COPD   [] Asthma Neurologic:  [] Dizziness  [] Blackouts   [] Seizures   [] History of stroke   [] History of TIA  [] Aphasia   [] Temporary blindness   [] Dysphagia   [] Weakness or numbness in arms   [x] Weakness or numbness in legs Musculoskeletal:  [x] Arthritis   [] Joint swelling   [] Joint pain   [x] Low back pain Hematologic:  [] Easy bruising  [] Easy bleeding   [] Hypercoagulable state   [x] Anemic   Gastrointestinal:  [] Blood in stool   [] Vomiting blood  [x] Gastroesophageal reflux/heartburn   [] Abdominal pain Genitourinary:  [] Chronic kidney disease   [] Difficult urination  [] Frequent urination  [] Burning with urination   [] Hematuria Skin:  [] Rashes   [] Ulcers   [] Wounds Psychological:  [] History of anxiety   []  History of major depression.   Physical Examination  There were no vitals taken for this visit. Gen:  WD/WN, NAD Head: [place]/AT, No temporalis  wasting. Ear/Nose/Throat: Hearing grossly intact, nares w/o erythema or drainage Eyes: Conjunctiva clear. Sclera non-icteric Neck: Supple.  Trachea midline Pulmonary:  Good air movement, no use of accessory muscles.  Cardiac: RRR, no JVD Vascular:  Vessel Right Left  Radial Palpable Palpable                          PT Palpable Palpable  DP Palpable Palpable   Gastrointestinal: soft, non-tender/non-distended. No guarding/reflex.  Musculoskeletal: M/S 5/5 throughout.  No deformity or atrophy. *** edema. Neurologic: Sensation grossly intact in extremities.  Symmetrical.  Speech is fluent.  Psychiatric: Judgment intact, Mood & affect appropriate for pt's clinical situation. Dermatologic: No rashes or ulcers noted.  No cellulitis or open wounds.      Labs Recent Results (from the past 2160 hours)  Surgical pathology     Status: None   Collection Time: 12/12/23 12:00 AM  Result Value Ref Range   SURGICAL PATHOLOGY      SURGICAL PATHOLOGY [facility] [address], [address] [place], [place] [address] Telephone [phone] or [phone] Fax [phone]  REPORT OF SURGICAL PATHOLOGY   Accession #: [id number] Patient Name: JAKARI, JACOT Visit # : [id number]  MRN: [id number] Physician: Rosina Lowenstein DOB/Age [birth year] (Age: 59) Gender: F Collected Date: 12/12/2023 Received Date: 12/12/2023  FINAL DIAGNOSIS       1. Rectum, polyp(s), X2 cbx :       - HYPERPLASTIC POLYP (2).      - NEGATIVE FOR DYSPLASIA AND MALIGNANCY.       DATE SIGNED OUT: 12/13/2023 ELECTRONIC SIGNATURE : Oneita Kras Md, Delice Bison , Pathologist, Electronic Signature  MICROSCOPIC DESCRIPTION  CASE COMMENTS STAINS USED IN DIAGNOSIS: H&E  CLINICAL HISTORY  SPECIMEN(S) OBTAINED 1. Rectum, polyp(s), X2 Cbx  SPECIMEN COMMENTS: SPECIMEN CLINICAL INFORMATION: 1. Hx of adenomatous colonic polyps. Diverticuosis, rectal polyps    Gross Description 1. Received in  formalin are ta n, soft tissue fragments that are submitted in toto.  Number:  two  Size:  0.2 cm, each  (KL:kh 12/12/23)        Report signed out from the following location(s) [place] [address], [place] [address] CLIA #: [id number]  [facility] [address] [place], [place] [address] CLIA #: [id number]     Radiology No results found.  Assessment/Plan  No problem-specific Assessment & Plan notes found for this encounter.  Essential (primary) hypertension blood pressure control important in reducing the progression of atherosclerotic disease. On appropriate oral medications.     Hyperlipidemia lipid control important in reducing the progression of atherosclerotic disease. Continue statin therapy  Festus Barren, MD  02/13/2024 1:27 PM    This note was created with Dragon medical transcription system.  Any errors from dictation are purely unintentional

## 2024-02-14 NOTE — Assessment & Plan Note (Signed)
 Carotid duplex reveals stable 1 to 39% right ICA stenosis and 40 to 59% left ICA stenosis without significant progression from previous studies.  She remains on Plavix and Lipitor. Recheck in one year

## 2024-03-27 ENCOUNTER — Other Ambulatory Visit: Payer: Self-pay | Admitting: Internal Medicine

## 2024-03-27 DIAGNOSIS — Z1231 Encounter for screening mammogram for malignant neoplasm of breast: Secondary | ICD-10-CM

## 2024-04-02 ENCOUNTER — Ambulatory Visit: Payer: Medicare Other | Admitting: Dermatology

## 2024-04-12 ENCOUNTER — Ambulatory Visit
Admission: RE | Admit: 2024-04-12 | Discharge: 2024-04-12 | Disposition: A | Discharge status: Home / Self Care | Source: Ambulatory Visit | Attending: Internal Medicine | Admitting: Internal Medicine

## 2024-04-12 ENCOUNTER — Other Ambulatory Visit: Payer: Self-pay | Admitting: Internal Medicine

## 2024-04-12 DIAGNOSIS — R270 Ataxia, unspecified: Secondary | ICD-10-CM | POA: Diagnosis present

## 2024-04-12 DIAGNOSIS — R42 Dizziness and giddiness: Secondary | ICD-10-CM

## 2024-04-23 ENCOUNTER — Encounter (INDEPENDENT_AMBULATORY_CARE_PROVIDER_SITE_OTHER): Payer: Self-pay

## 2024-05-07 ENCOUNTER — Ambulatory Visit
Admission: RE | Admit: 2024-05-07 | Discharge: 2024-05-07 | Disposition: A | Discharge status: Home / Self Care | Source: Ambulatory Visit | Attending: Internal Medicine | Admitting: Internal Medicine

## 2024-05-07 DIAGNOSIS — Z1231 Encounter for screening mammogram for malignant neoplasm of breast: Secondary | ICD-10-CM | POA: Diagnosis present

## 2024-05-21 ENCOUNTER — Ambulatory Visit (INDEPENDENT_AMBULATORY_CARE_PROVIDER_SITE_OTHER): Payer: Medicare Other | Admitting: Dermatology

## 2024-05-21 DIAGNOSIS — L649 Androgenic alopecia, unspecified: Secondary | ICD-10-CM

## 2024-05-21 DIAGNOSIS — W908XXA Exposure to other nonionizing radiation, initial encounter: Secondary | ICD-10-CM

## 2024-05-21 DIAGNOSIS — Z86006 Personal history of melanoma in-situ: Secondary | ICD-10-CM

## 2024-05-21 DIAGNOSIS — L814 Other melanin hyperpigmentation: Secondary | ICD-10-CM

## 2024-05-21 DIAGNOSIS — Z1283 Encounter for screening for malignant neoplasm of skin: Secondary | ICD-10-CM

## 2024-05-21 DIAGNOSIS — D229 Melanocytic nevi, unspecified: Secondary | ICD-10-CM

## 2024-05-21 DIAGNOSIS — L578 Other skin changes due to chronic exposure to nonionizing radiation: Secondary | ICD-10-CM

## 2024-05-21 DIAGNOSIS — D1801 Hemangioma of skin and subcutaneous tissue: Secondary | ICD-10-CM

## 2024-05-21 DIAGNOSIS — Z85828 Personal history of other malignant neoplasm of skin: Secondary | ICD-10-CM

## 2024-05-21 DIAGNOSIS — Z7189 Other specified counseling: Secondary | ICD-10-CM

## 2024-05-21 DIAGNOSIS — L820 Inflamed seborrheic keratosis: Secondary | ICD-10-CM | POA: Diagnosis not present

## 2024-05-21 DIAGNOSIS — L821 Other seborrheic keratosis: Secondary | ICD-10-CM

## 2024-05-21 DIAGNOSIS — Z79899 Other long term (current) drug therapy: Secondary | ICD-10-CM

## 2024-05-21 DIAGNOSIS — L409 Psoriasis, unspecified: Secondary | ICD-10-CM

## 2024-05-21 MED ORDER — FLUOCINONIDE EMULSIFIED BASE 0.05 % EX CREA: TOPICAL_CREAM | CUTANEOUS | 1 refills | Status: AC

## 2024-05-21 NOTE — Progress Notes (Signed)
 Follow-Up Visit   Subjective  Carol Carey is a 73 y.o. female who presents for the following: Skin Cancer Screening and Full Body Skin Exam. Patient with hx of BCC, MMIS. Patient also with hx of psoriasis and uses ketoconazole  shampoo, fluocinonide  solution, and fluocinonide  cream and her psoriasis has been well-controlled. She also take finasteride  for hair loss, which seems to be helping.  The patient presents for Total-Body Skin Exam (TBSE) for skin cancer screening and mole check. The patient has spots, moles and lesions to be evaluated, some may be new or changing and the patient may have concern these could be cancer.  She has some ISKs that need to be treated today (postponed from last visit).    The following portions of the chart were reviewed this encounter and updated as appropriate: medications, allergies, medical history  Review of Systems:  No other skin or systemic complaints except as noted in HPI or Assessment and Plan.  Objective  Well appearing patient in no apparent distress; mood and affect are within normal limits.  A full examination was performed including scalp, head, eyes, ears, nose, lips, neck, chest, axillae, abdomen, back, buttocks, bilateral upper extremities, bilateral lower extremities, hands, feet, fingers, toes, fingernails, and toenails. All findings within normal limits unless otherwise noted below.   Relevant physical exam findings are noted in the Assessment and Plan.  Frontal scalp x2, R mandible x1, L medial shoulder x2 (5) Stuck on waxy paps with erythema  Assessment & Plan   SKIN CANCER SCREENING PERFORMED TODAY.  ACTINIC DAMAGE - Chronic condition, secondary to cumulative UV/sun exposure - diffuse scaly erythematous macules with underlying dyspigmentation - Recommend daily broad spectrum sunscreen SPF 30+ to sun-exposed areas, reapply every 2 hours as needed.  - Staying in the shade or wearing long sleeves, sun glasses (UVA+UVB  protection) and wide brim hats (4-inch brim around the entire circumference of the hat) are also recommended for sun protection.  - Call for new or changing lesions.  LENTIGINES, SEBORRHEIC KERATOSES, HEMANGIOMAS - Benign normal skin lesions - Benign-appearing - Call for any changes  MELANOCYTIC NEVI - Tan-brown and/or pink-flesh-colored symmetric macules and papules - Benign appearing on exam today - Observation - Call clinic for new or changing moles - Recommend daily use of broad spectrum spf 30+ sunscreen to sun-exposed areas.   HISTORY OF BASAL CELL CARCINOMA OF THE SKIN Left inferior nasal ala- EDC 10/18/2023 Left nasal ala BCC- [facility]OHs at [facility] - No evidence of recurrence today - Recommend regular full body skin exams - Recommend daily broad spectrum sunscreen SPF 30+ to sun-exposed areas, reapply every 2 hours as needed.  - Call if any new or changing lesions are noted between office visits  HISTORY OF MELANOMA IN SITU L forearm- MOHs 08/13/2021 - No evidence of recurrence today - Recommend regular full body skin exams - Recommend daily broad spectrum sunscreen SPF 30+ to sun-exposed areas, reapply every 2 hours as needed.  - Call if any new or changing lesions are noted between office visits  PSORIASIS Exam: Small pink scaly plaque at L elbow <1 % BSA. Scalp clear.  Chronic condition with duration or expected duration over one year. Currently well-controlled.  Patient denies joint pain.  Psoriasis is a chronic non-curable, but treatable genetic/hereditary disease that may have other systemic features affecting other organ systems such as joints (Psoriatic Arthritis). It is associated with an increased risk of inflammatory bowel disease, heart disease, non-alcoholic fatty liver disease, and depression.  Treatments  include light and laser treatments; topical medications; and systemic medications including oral and injectables.  Treatment Plan: Continue Ketoconazole  2% shampoo  - massage into scalp and let sit several minutes before rinsing   Continue fluocinonide  solution once to twice daily to affected areas scalp as needed for itch. Avoid applying to face, groin, and axilla. Use as directed. Long-term use can cause thinning of the skin.  Continue fluocinonide  cream once to twice daily to AA body. Avoid applying to face, groin, and axilla. Use as directed. Long-term use can cause thinning of the skin.   Counseling on psoriasis and coordination of care  psoriasis is a chronic non-curable, but treatable genetic/hereditary disease that may have other systemic features affecting other organ systems such as joints (Psoriatic Arthritis). It is associated with an increased risk of inflammatory bowel disease, heart disease, non-alcoholic fatty liver disease, and depression.  Treatments include light and laser treatments; topical medications; and systemic medications including oral and injectables.   Topical steroids (such as triamcinolone, fluocinolone, fluocinonide , mometasone, clobetasol, halobetasol, betamethasone, hydrocortisone) can cause thinning and lightening of the skin if they are used for too long in the same area. Your physician has selected the right strength medicine for your problem and area affected on the body. Please use your medication only as directed by your physician to prevent side effects.  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 Condition is improving with treatment but not currently at goal.   Female Androgenic Alopecia is a chronic condition related to genetics and/or hormonal changes.  In women androgenetic alopecia is commonly associated with menopause but may occur any time after puberty.  It causes hair thinning primarily on the crown with widening of  the part and temporal hairline recession.  Can use OTC Rogaine  (minoxidil ) 5% solution/foam as directed.  Oral treatments in female patients who have no contraindication may include : - Low dose oral minoxidil  1.25 - 5mg  daily - Spironolactone 50 - 100mg  bid - Finasteride  2.5 - 5 mg daily Adjunctive therapies include: - Low Level Laser Light Therapy (LLLT) - Platelet-rich plasma injections (PRP) - Hair Transplants or scalp reduction   Treatment Plan: Continue finasteride  take 5mg  once daily.  Patient had side effects with oral minoxidil .    Long term medication management.  Patient is using long term (months to years) prescription medication  to control their dermatologic condition.  These medications require periodic monitoring to evaluate for efficacy and side effects and may require periodic laboratory monitoring.      INFLAMED SEBORRHEIC KERATOSIS (5) Frontal scalp x2, R mandible x1, L medial shoulder x2 (5) Symptomatic, irritating, patient would like treated. Destruction of lesion - Frontal scalp x2, R mandible x1, L medial shoulder x2 (5)  Destruction method: cryotherapy   Informed consent: discussed and consent obtained   Lesion destroyed using liquid nitrogen: Yes   Region frozen until ice ball extended beyond lesion: Yes   Outcome: patient tolerated procedure well with no complications   Post-procedure details: wound care instructions given   Additional details:  Prior to procedure, discussed risks of blister formation, small wound, skin dyspigmentation, or rare scar following cryotherapy. Recommend Vaseline ointment to treated areas while healing.   Return in 6 months (on 11/20/2024) for w/ Dr. Annette Barters, HxMMIS, HxBCC, spot check, Alopecia.  I, Jacquelynn V. Grier Leber, CMA, am  acting as scribe for Artemio Larry, MD .   Documentation: I have reviewed the above documentation for accuracy and completeness, and I agree with the above.  Artemio Larry, MD

## 2024-05-21 NOTE — Patient Instructions (Addendum)
 For psoriasis: Continue Ketoconazole  2% shampoo - massage into scalp and let sit several minutes before rinsing   Continue fluocinonide  solution once to twice daily to affected areas scalp as needed for itch. Avoid applying to face, groin, and axilla. Use as directed. Long-term use can cause thinning of the skin.  Continue fluocinonide  cream once to twice daily to affected areas body. Avoid applying to face, groin, and axilla. Use as directed. Long-term use can cause thinning of the skin.   For hair loss: Continue taking finasteride  5 mg once daily.    Cryotherapy Aftercare  Wash gently with soap and water everyday.   Apply Vaseline and Band-Aid daily until healed.    Recommend daily broad spectrum sunscreen SPF 30+ to sun-exposed areas, reapply every 2 hours as needed. Call for new or changing lesions.  Staying in the shade or wearing long sleeves, sun glasses (UVA+UVB protection) and wide brim hats (4-inch brim around the entire circumference of the hat) are also recommended for sun protection.     Melanoma ABCDEs  Melanoma is the most dangerous type of skin cancer, and is the leading cause of death from skin disease.  You are more likely to develop melanoma if you: Have light-colored skin, light-colored eyes, or red or blond hair Spend a lot of time in the sun Tan regularly, either outdoors or in a tanning bed Have had blistering sunburns, especially during childhood Have a close family member who has had a melanoma Have atypical moles or large birthmarks  Early detection of melanoma is key since treatment is typically straightforward and cure rates are extremely high if we catch it early.   The first sign of melanoma is often a change in a mole or a new dark spot.  The ABCDE system is a way of remembering the signs of melanoma.  A for asymmetry:  The two halves do not match. B for border:  The edges of the growth are irregular. C for color:  A mixture of colors are present  instead of an even brown color. D for diameter:  Melanomas are usually (but not always) greater than 6mm - the size of a pencil eraser. E for evolution:  The spot keeps changing in size, shape, and color.  Please check your skin once per month between visits. You can use a small mirror in front and a large mirror behind you to keep an eye on the back side or your body.   If you see any new or changing lesions before your next follow-up, please call to schedule a visit.  Please continue daily skin protection including broad spectrum sunscreen SPF 30+ to sun-exposed areas, reapplying every 2 hours as needed when you're outdoors.   Staying in the shade or wearing long sleeves, sun glasses (UVA+UVB protection) and wide brim hats (4-inch brim around the entire circumference of the hat) are also recommended for sun protection.    Due to recent changes in healthcare laws, you may see results of your pathology and/or laboratory studies on MyChart before the doctors have had a chance to review them. We understand that in some cases there may be results that are confusing or concerning to you. Please understand that not all results are received at the same time and often the doctors may need to interpret multiple results in order to provide you with the best plan of care or course of treatment. Therefore, we ask that you please give us  2 business days to thoroughly review all your  results before contacting the office for clarification. Should we see a critical lab result, you will be contacted sooner.   If You Need Anything After Your Visit  If you have any questions or concerns for your doctor, please call our main line at [phone] and press option 4 to reach your doctor's medical assistant. If no one answers, please leave a voicemail as directed and we will return your call as soon as possible. Messages left after 4 pm will be answered the following business day.   You may also send us  a message via  MyChart. We typically respond to MyChart messages within 1-2 business days.  For prescription refills, please ask your pharmacy to contact our office. Our fax number is [phone].  If you have an urgent issue when the clinic is closed that cannot wait until the next business day, you can page your doctor at the number below.    Please note that while we do our best to be available for urgent issues outside of office hours, we are not available 24/7.   If you have an urgent issue and are unable to reach us , you may choose to seek medical care at your doctor's office, retail clinic, urgent care center, or emergency room.  If you have a medical emergency, please immediately call 911 or go to the emergency department.  Pager Numbers  - Dr. Bary Likes: [phone]  - Dr. Annette Barters: [phone]  - Dr. Felipe Horton: [phone]   In the event of inclement weather, please call our main line at [phone] for an update on the status of any delays or closures.  Dermatology Medication Tips: Please keep the boxes that topical medications come in in order to help keep track of the instructions about where and how to use these. Pharmacies typically print the medication instructions only on the boxes and not directly on the medication tubes.   If your medication is too expensive, please contact our office at [phone] option 4 or send us  a message through MyChart.   We are unable to tell what your co-pay for medications will be in advance as this is different depending on your insurance coverage. However, we may be able to find a substitute medication at lower cost or fill out paperwork to get insurance to cover a needed medication.   If a prior authorization is required to get your medication covered by your insurance company, please allow us  1-2 business days to complete this process.  Drug prices often vary depending on where the prescription is filled and some pharmacies may offer cheaper  prices.  The website www.goodrx.com contains coupons for medications through different pharmacies. The prices here do not account for what the cost may be with help from insurance (it may be cheaper with your insurance), but the website can give you the price if you did not use any insurance.  - You can print the associated coupon and take it with your prescription to the pharmacy.  - You may also stop by our office during regular business hours and pick up a GoodRx coupon card.  - If you need your prescription sent electronically to a different pharmacy, notify our office through [facility] or by phone at [phone] option 4.     Si Usted Necesita Algo Despus de Su Visita  Tambin puede enviarnos un mensaje a travs de Clinical cytogeneticist. Por lo general respondemos a los mensajes de MyChart en el transcurso de 1 a 2 das hbiles.  Para renovar recetas, por  favor pida a su farmacia que se ponga en contacto con nuestra oficina. Franz Jacks de fax es [place] [phone].  Si tiene un asunto urgente cuando la clnica est cerrada y que no puede esperar hasta el siguiente da hbil, puede llamar/localizar a su doctor(a) al nmero que aparece a continuacin.   Por favor, tenga en cuenta que aunque hacemos todo lo posible para estar disponibles para asuntos urgentes fuera del horario de [place], no estamos disponibles las 24 horas del da, los 7 [address]  [address] de la [place].   Si tiene un problema urgente y no puede comunicarse con nosotros, puede optar por buscar atencin mdica  en el consultorio de su doctor(a), en una clnica privada, en un centro de atencin urgente o en una sala de emergencias.  Si tiene Engineer, drilling, por favor llame inmediatamente al 911 o vaya a la sala de emergencias.  Nmeros de bper  - Dr. Bary Likes: [phone]  - Dra. Annette Barters: [phone]  - Dr. Felipe Horton: [phone]   En caso de inclemencias del tiempo, por favor llame a Lajuan Pila principal al [phone]  para una actualizacin sobre el [place] de cualquier retraso o cierre.  Consejos para la medicacin en dermatologa: Por favor, guarde las cajas en las que vienen los medicamentos de uso tpico para ayudarle a seguir las instrucciones sobre dnde y cmo usarlos. Las farmacias generalmente imprimen las instrucciones del medicamento slo en las cajas y no directamente en los tubos del [place].   Si su medicamento es muy caro, por favor, pngase en contacto con Bettyjane Brunet llamando al [phone] y presione la opcin 4 o envenos un mensaje a travs de Clinical cytogeneticist.   No podemos decirle cul ser su copago por los medicamentos por adelantado ya que esto es diferente dependiendo de la cobertura de su seguro. Sin embargo, es posible que podamos encontrar un medicamento sustituto a Audiological scientist un formulario para que el seguro cubra el medicamento que se considera necesario.   Si se requiere una autorizacin previa para que su compaa de seguros [place] su medicamento, por favor permtanos de 1 a 2 das hbiles para completar este proceso.  Los precios de los medicamentos varan con frecuencia dependiendo del Environmental consultant de dnde se surte la receta y alguna farmacias pueden ofrecer precios ms baratos.  El sitio web www.goodrx.com tiene cupones para medicamentos de Health and safety inspector. Los precios aqu no tienen en cuenta lo que podra costar con la ayuda del seguro (puede ser ms barato con su seguro), pero el sitio web puede darle el precio si no utiliz Tourist information centre manager.  - Puede imprimir el cupn correspondiente y llevarlo con su receta a la farmacia.  - Tambin puede pasar por nuestra oficina durante el horario de atencin regular y Education officer, museum una tarjeta de cupones de GoodRx.  - Si necesita que su receta se enve electrnicamente a una farmacia diferente, informe a nuestra oficina a travs de MyChart de [place] o por telfono llamando al [phone] y presione la opcin 4.

## 2024-09-05 ENCOUNTER — Ambulatory Visit
Admission: RE | Admit: 2024-09-05 | Discharge: 2024-09-05 | Disposition: A | Discharge status: Home / Self Care | Source: Ambulatory Visit | Attending: Acute Care | Admitting: Acute Care

## 2024-09-05 DIAGNOSIS — Z87891 Personal history of nicotine dependence: Secondary | ICD-10-CM | POA: Insufficient documentation

## 2024-09-05 DIAGNOSIS — Z122 Encounter for screening for malignant neoplasm of respiratory organs: Secondary | ICD-10-CM | POA: Insufficient documentation

## 2024-09-10 ENCOUNTER — Other Ambulatory Visit: Payer: Self-pay

## 2024-09-10 DIAGNOSIS — Z87891 Personal history of nicotine dependence: Secondary | ICD-10-CM

## 2024-09-10 DIAGNOSIS — Z122 Encounter for screening for malignant neoplasm of respiratory organs: Secondary | ICD-10-CM

## 2024-09-12 ENCOUNTER — Other Ambulatory Visit: Payer: Self-pay | Admitting: Dermatology

## 2024-11-12 ENCOUNTER — Encounter: Payer: Self-pay | Admitting: Dermatology

## 2024-11-12 ENCOUNTER — Ambulatory Visit: Admitting: Dermatology

## 2024-11-12 DIAGNOSIS — Z7189 Other specified counseling: Secondary | ICD-10-CM

## 2024-11-12 DIAGNOSIS — D229 Melanocytic nevi, unspecified: Secondary | ICD-10-CM

## 2024-11-12 DIAGNOSIS — L821 Other seborrheic keratosis: Secondary | ICD-10-CM

## 2024-11-12 DIAGNOSIS — D1801 Hemangioma of skin and subcutaneous tissue: Secondary | ICD-10-CM

## 2024-11-12 DIAGNOSIS — L820 Inflamed seborrheic keratosis: Secondary | ICD-10-CM

## 2024-11-12 DIAGNOSIS — L578 Other skin changes due to chronic exposure to nonionizing radiation: Secondary | ICD-10-CM

## 2024-11-12 DIAGNOSIS — Z1283 Encounter for screening for malignant neoplasm of skin: Secondary | ICD-10-CM

## 2024-11-12 DIAGNOSIS — Z85828 Personal history of other malignant neoplasm of skin: Secondary | ICD-10-CM

## 2024-11-12 DIAGNOSIS — L409 Psoriasis, unspecified: Secondary | ICD-10-CM

## 2024-11-12 DIAGNOSIS — Z79899 Other long term (current) drug therapy: Secondary | ICD-10-CM

## 2024-11-12 DIAGNOSIS — L814 Other melanin hyperpigmentation: Secondary | ICD-10-CM

## 2024-11-12 DIAGNOSIS — L649 Androgenic alopecia, unspecified: Secondary | ICD-10-CM

## 2024-11-12 DIAGNOSIS — Z86006 Personal history of melanoma in-situ: Secondary | ICD-10-CM

## 2024-11-12 MED ORDER — FINASTERIDE 5 MG PO TABS: 5.0000 mg | ORAL_TABLET | Freq: Every day | ORAL | 5 refills | Status: AC

## 2024-11-12 NOTE — Patient Instructions (Addendum)
 Continue Ketoconazole  2% shampoo - massage into scalp and let sit several minutes before rinsing   Continue fluocinonide  solution once to twice daily to affected areas scalp as needed for itch. Avoid applying to face, groin, and axilla. Use as directed. Long-term use can cause thinning of the skin.  Continue fluocinonide  cream once to twice daily to affected areas on body. Avoid applying to face, groin, and axilla. Use as directed. Long-term use can cause thinning of the skin.     Topical steroids (such as triamcinolone, fluocinolone, fluocinonide , mometasone, clobetasol, halobetasol, betamethasone, hydrocortisone) can cause thinning and lightening of the skin if they are used for too long in the same area. Your physician has selected the right strength medicine for your problem and area affected on the body. Please use your medication only as directed by your physician to prevent side effects.      Continue finasteride  take 5mg  once daily.   Counseled that finasteride  can decrease libido (sexual drive). Advised it should not be taken by pregnant women or women who could become pregnant. Advised not to donate blood products while taking this medication. Advised if medication is stopped, they may lose the hair the medication has been helping to grow.    Cryotherapy Aftercare  Wash gently with soap and water everyday.   Apply Vaseline Jelly daily until healed.    Recommend daily broad spectrum sunscreen SPF 30+ to sun-exposed areas, reapply every 2 hours as needed. Call for new or changing lesions.  Staying in the shade or wearing long sleeves, sun glasses (UVA+UVB protection) and wide brim hats (4-inch brim around the entire circumference of the hat) are also recommended for sun protection.      Melanoma ABCDEs  Melanoma is the most dangerous type of skin cancer, and is the leading cause of death from skin disease.  You are more likely to develop melanoma if you: Have light-colored  skin, light-colored eyes, or red or blond hair Spend a lot of time in the sun Tan regularly, either outdoors or in a tanning bed Have had blistering sunburns, especially during childhood Have a close family member who has had a melanoma Have atypical moles or large birthmarks  Early detection of melanoma is key since treatment is typically straightforward and cure rates are extremely high if we catch it early.   The first sign of melanoma is often a change in a mole or a new dark spot.  The ABCDE system is a way of remembering the signs of melanoma.  A for asymmetry:  The two halves do not match. B for border:  The edges of the growth are irregular. C for color:  A mixture of colors are present instead of an even brown color. D for diameter:  Melanomas are usually (but not always) greater than 6mm - the size of a pencil eraser. E for evolution:  The spot keeps changing in size, shape, and color.  Please check your skin once per month between visits. You can use a small mirror in front and a large mirror behind you to keep an eye on the back side or your body.   If you see any new or changing lesions before your next follow-up, please call to schedule a visit.  Please continue daily skin protection including broad spectrum sunscreen SPF 30+ to sun-exposed areas, reapplying every 2 hours as needed when you're outdoors.   Staying in the shade or wearing long sleeves, sun glasses (UVA+UVB protection) and wide brim hats (4-inch  brim around the entire circumference of the hat) are also recommended for sun protection.      Due to recent changes in healthcare laws, you may see results of your pathology and/or laboratory studies on MyChart before the doctors have had a chance to review them. We understand that in some cases there may be results that are confusing or concerning to you. Please understand that not all results are received at the same time and often the doctors may need to interpret  multiple results in order to provide you with the best plan of care or course of treatment. Therefore, we ask that you please give us  2 business days to thoroughly review all your results before contacting the office for clarification. Should we see a critical lab result, you will be contacted sooner.   If You Need Anything After Your Visit  If you have any questions or concerns for your doctor, please call our main line at [phone] and press option 4 to reach your doctor's medical assistant. If no one answers, please leave a voicemail as directed and we will return your call as soon as possible. Messages left after 4 pm will be answered the following business day.   You may also send us  a message via MyChart. We typically respond to MyChart messages within 1-2 business days.  For prescription refills, please ask your pharmacy to contact our office. Our fax number is [phone].  If you have an urgent issue when the clinic is closed that cannot wait until the next business day, you can page your doctor at the number below.    Please note that while we do our best to be available for urgent issues outside of office hours, we are not available 24/7.   If you have an urgent issue and are unable to reach us , you may choose to seek medical care at your doctor's office, retail clinic, urgent care center, or emergency room.  If you have a medical emergency, please immediately call 911 or go to the emergency department.  Pager Numbers  - Dr. Hester: [phone]  - Dr. Jackquline: [phone]  - Dr. Claudene: [phone]   - Dr. Raymund: [phone]  In the event of inclement weather, please call our main line at [phone] for an update on the status of any delays or closures.  Dermatology Medication Tips: Please keep the boxes that topical medications come in in order to help keep track of the instructions about where and how to use these. Pharmacies typically print the medication  instructions only on the boxes and not directly on the medication tubes.   If your medication is too expensive, please contact our office at [phone] option 4 or send us  a message through MyChart.   We are unable to tell what your co-pay for medications will be in advance as this is different depending on your insurance coverage. However, we may be able to find a substitute medication at lower cost or fill out paperwork to get insurance to cover a needed medication.   If a prior authorization is required to get your medication covered by your insurance company, please allow us  1-2 business days to complete this process.  Drug prices often vary depending on where the prescription is filled and some pharmacies may offer cheaper prices.  The website www.goodrx.com contains coupons for medications through different pharmacies. The prices here do not account for what the cost may be with help from insurance (it may be cheaper with your insurance), but  the website can give you the price if you did not use any insurance.  - You can print the associated coupon and take it with your prescription to the pharmacy.  - You may also stop by our office during regular business hours and pick up a GoodRx coupon card.  - If you need your prescription sent electronically to a different pharmacy, notify our office through [facility] or by phone at [phone] option 4.     Si Usted Necesita Algo Despus de Su Visita  Tambin puede enviarnos un mensaje a travs de Clinical Cytogeneticist. Por lo general respondemos a los mensajes de MyChart en el transcurso de 1 a 2 das hbiles.  Para renovar recetas, por favor pida a su farmacia que se ponga en contacto con nuestra oficina. Randi lakes de fax es [place] [phone].  Si tiene un asunto urgente cuando la clnica est cerrada y que no puede esperar hasta el siguiente da hbil, puede llamar/localizar a su doctor(a) al nmero que aparece a continuacin.   Por  favor, tenga en cuenta que aunque hacemos todo lo posible para estar disponibles para asuntos urgentes fuera del horario de [place], no estamos disponibles las 24 horas del da, los 7 [address]  [address] de la [place].   Si tiene un problema urgente y no puede comunicarse con nosotros, puede optar por buscar atencin mdica  en el consultorio de su doctor(a), en una clnica privada, en un centro de atencin urgente o en una sala de emergencias.  Si tiene engineer, drilling, por favor llame inmediatamente al 911 o vaya a la sala de emergencias.  Nmeros de bper  - Dr. Hester: [phone]  - Dra. Jackquline: [phone]  - Dr. Claudene: [phone]  - Dra. Kitts: [phone]  En caso de inclemencias del [place], por favor llame a nuestra lnea principal al [phone] para una actualizacin sobre el [place] de cualquier retraso o cierre.  Consejos para la medicacin en dermatologa: Por favor, guarde las cajas en las que vienen los medicamentos de uso tpico para ayudarle a seguir las instrucciones sobre dnde y cmo usarlos. Las farmacias generalmente imprimen las instrucciones del medicamento slo en las cajas y no directamente en los tubos del [place].   Si su medicamento es muy caro, por favor, pngase en contacto con landry rieger llamando al [phone] y presione la opcin 4 o envenos un mensaje a travs de Clinical Cytogeneticist.   No podemos decirle cul ser su copago por los medicamentos por adelantado ya que esto es diferente dependiendo de la cobertura de su seguro. Sin embargo, es posible que podamos encontrar un medicamento sustituto a audiological scientist un formulario para que el seguro cubra el medicamento que se considera necesario.   Si se requiere una autorizacin previa para que su compaa de seguros cubra su medicamento, por favor permtanos de 1 a 2 das hbiles para completar este proceso.  Los precios de los medicamentos varan con frecuencia dependiendo del environmental consultant de dnde se surte la  receta y alguna farmacias pueden ofrecer precios ms baratos.  El sitio web www.goodrx.com tiene cupones para medicamentos de health and safety inspector. Los precios aqu no tienen en cuenta lo que podra costar con la ayuda del seguro (puede ser ms barato con su seguro), pero el sitio web puede darle el precio si no utiliz tourist information centre manager.  - Puede imprimir el cupn correspondiente y llevarlo con su receta a la farmacia.  - Tambin puede pasar por nuestra oficina durante el horario de atencin regular y education officer, museum  una tarjeta de cupones de GoodRx.  - Si necesita que su receta se enve electrnicamente a una farmacia diferente, informe a nuestra oficina a travs de MyChart de [place] o por telfono llamando al [phone] y presione la opcin 4.

## 2024-11-12 NOTE — Progress Notes (Deleted)
   Follow-Up Visit   Subjective  Carol Carey is a 73 y.o. female who presents for the following: ***   The following portions of the chart were reviewed this encounter and updated as appropriate: medications, allergies, medical history  Review of Systems:  No other skin or systemic complaints except as noted in HPI or Assessment and Plan.  Objective  Well appearing patient in no apparent distress; mood and affect are within normal limits.  ***A full examination was performed including scalp, head, eyes, ears, nose, lips, neck, chest, axillae, abdomen, back, buttocks, bilateral upper extremities, bilateral lower extremities, hands, feet, fingers, toes, fingernails, and toenails. All findings within normal limits unless otherwise noted below.  ***A focused examination was performed of the following areas: ***  Relevant exam findings are noted in the Assessment and Plan.    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 Condition is improving with treatment but not currently at goal.     Female Androgenic Alopecia is a chronic condition related to genetics and/or hormonal changes.  In women androgenetic alopecia is commonly associated with menopause but may occur any time after puberty.  It causes hair thinning primarily on the crown with widening of the part and temporal hairline recession.  Can use OTC Rogaine  (minoxidil ) 5% solution/foam as directed.  Oral treatments in female patients who have no contraindication may include : - Low dose oral minoxidil  1.25 - 5mg  daily - Spironolactone 50 - 100mg  bid - Finasteride  2.5 - 5 mg daily Adjunctive therapies include: - Low Level Laser Light Therapy (LLLT) - Platelet-rich plasma injections (PRP) - Hair Transplants or  scalp reduction    Treatment Plan: Continue finasteride  take 5mg  once daily.   Patient had side effects with oral minoxidil .      Long term medication management.  Patient is using long term (months to years) prescription medication  to control their dermatologic condition.  These medications require periodic monitoring to evaluate for efficacy and side effects and may require periodic laboratory monitoring.     No follow-ups on file.  I, Crews Mccollam, CMA, am acting as scribe for Rexene Rattler, MD.   Documentation: I have reviewed the above documentation for accuracy and completeness, and I agree with the above.  Rexene Rattler, MD

## 2024-11-12 NOTE — Progress Notes (Signed)
 Follow-Up Visit   Subjective  Carol Carey is a 73 y.o. female who presents for the following: Skin Cancer Screening and Full Body Skin Exam. Patient with hx of BCC, MMIS. Patient also with hx of psoriasis and uses ketoconazole  shampoo, fluocinonide  solution, and fluocinonide  cream and her psoriasis has been well-controlled. She also take finasteride  for hair loss, which seems to be helping.  The patient presents for Total-Body Skin Exam (TBSE) for skin cancer screening and mole check. The patient has spots, moles and lesions to be evaluated, some may be new or changing and the patient may have concern these could be cancer.  She has some irritated spots on chest.   The following portions of the chart were reviewed this encounter and updated as appropriate: medications, allergies, medical history  Review of Systems:  No other skin or systemic complaints except as noted in HPI or Assessment and Plan.  Objective  Well appearing patient in no apparent distress; mood and affect are within normal limits.  A full examination was performed including scalp, head, eyes, ears, nose, lips, neck, chest, axillae, abdomen, back, buttocks, bilateral upper extremities, bilateral lower extremities, hands, feet, fingers, toes, fingernails, and toenails. All findings within normal limits unless otherwise noted below.   Relevant physical exam findings are noted in the Assessment and Plan.  R upper chest x2, L clavicle x1, (3) Erythematous keratotic or waxy stuck-on papule  Assessment & Plan   SKIN CANCER SCREENING PERFORMED TODAY.  ACTINIC DAMAGE - Chronic condition, secondary to cumulative UV/sun exposure - diffuse scaly erythematous macules with underlying dyspigmentation - Recommend daily broad spectrum sunscreen SPF 30+ to sun-exposed areas, reapply every 2 hours as needed.  - Staying in the shade or wearing long sleeves, sun glasses (UVA+UVB protection) and wide brim hats (4-inch brim around  the entire circumference of the hat) are also recommended for sun protection.  - Call for new or changing lesions.  LENTIGINES, SEBORRHEIC KERATOSES, HEMANGIOMAS - Benign normal skin lesions - Benign-appearing - Call for any changes  MELANOCYTIC NEVI - Tan-brown and/or pink-flesh-colored symmetric macules and papules - Benign appearing on exam today - Observation - Call clinic for new or changing moles - Recommend daily use of broad spectrum spf 30+ sunscreen to sun-exposed areas.   HISTORY OF BASAL CELL CARCINOMA OF THE SKIN Left inferior nasal ala- EDC 10/18/2023 Left nasal ala BCC- MOHs at [facility] - No evidence of recurrence today - Recommend regular full body skin exams - Recommend daily broad spectrum sunscreen SPF 30+ to sun-exposed areas, reapply every 2 hours as needed.  - Call if any new or changing lesions are noted between office visits  HISTORY OF MELANOMA IN SITU L forearm- MOHs 08/13/2021 - No evidence of recurrence today - Recommend regular full body skin exams - Recommend daily broad spectrum sunscreen SPF 30+ to sun-exposed areas, reapply every 2 hours as needed.  - Call if any new or changing lesions are noted between office visits  PSORIASIS Exam: Small pink scaly plaque at L elbow 3% BSA. Scalp clear. Pink plaque with excoriations at spinal lower back.  Chronic and persistent condition with duration or expected duration over one year. Condition is symptomatic/ bothersome to patient. Not currently at goal.   Patient denies joint pain.  Psoriasis is a chronic non-curable, but treatable genetic/hereditary disease that may have other systemic features affecting other organ systems such as joints (Psoriatic Arthritis). It is associated with an increased risk of inflammatory bowel disease, heart disease, non-alcoholic  fatty liver disease, and depression.  Treatments include light and laser treatments; topical medications; and systemic medications including oral and  injectables.  Treatment Plan: Continue Ketoconazole  2% shampoo - massage into scalp and let sit several minutes before rinsing   Continue fluocinonide  solution once to twice daily to affected areas scalp as needed for itch. Avoid applying to face, groin, and axilla. Use as directed. Long-term use can cause thinning of the skin.  Continue fluocinonide  cream once to twice daily to AA body. Avoid applying to face, groin, and axilla. Use as directed. Long-term use can cause thinning of the skin.   Counseling on psoriasis and coordination of care  psoriasis is a chronic non-curable, but treatable genetic/hereditary disease that may have other systemic features affecting other organ systems such as joints (Psoriatic Arthritis). It is associated with an increased risk of inflammatory bowel disease, heart disease, non-alcoholic fatty liver disease, and depression.  Treatments include light and laser treatments; topical medications; and systemic medications including oral and injectables.   Topical steroids (such as triamcinolone, fluocinolone, fluocinonide , mometasone, clobetasol, halobetasol, betamethasone, hydrocortisone) can cause thinning and lightening of the skin if they are used for too long in the same area. Your physician has selected the right strength medicine for your problem and area affected on the body. Please use your medication only as directed by your physician to prevent side effects.  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 Condition is improving with treatment but not currently at goal.   Female Androgenic Alopecia is a chronic condition related to genetics and/or hormonal changes.  In women androgenetic alopecia is commonly associated with menopause but may occur any time after  puberty.  It causes hair thinning primarily on the crown with widening of the part and temporal hairline recession.  Can use OTC Rogaine  (minoxidil ) 5% solution/foam as directed.  Oral treatments in female patients who have no contraindication may include : - Low dose oral minoxidil  1.25 - 5mg  daily - Spironolactone 50 - 100mg  bid - Finasteride  2.5 - 5 mg daily Adjunctive therapies include: - Low Level Laser Light Therapy (LLLT) - Platelet-rich plasma injections (PRP) - Hair Transplants or scalp reduction   Treatment Plan: Continue finasteride  take 5mg  once daily.  Patient had side effects with oral minoxidil .    Long term medication management.  Patient is using long term (months to years) prescription medication  to control their dermatologic condition.  These medications require periodic monitoring to evaluate for efficacy and side effects and may require periodic laboratory monitoring.      INFLAMED SEBORRHEIC KERATOSIS (3) R upper chest x2, L clavicle x1, (3) Symptomatic, irritating, patient would like treated. Destruction of lesion - R upper chest x2, L clavicle x1, (3)  Destruction method: cryotherapy   Informed consent: discussed and consent obtained   Lesion destroyed using liquid nitrogen: Yes   Region frozen until ice ball extended beyond lesion: Yes   Outcome: patient tolerated procedure well with no complications   Post-procedure details: wound care instructions given   Additional details:  Prior to procedure, discussed risks of blister formation, small wound, skin dyspigmentation, or rare scar following cryotherapy. Recommend Vaseline ointment to treated areas while healing.    Return in about 6 months (around 05/13/2025) for TBSE, HxMM, HxBCC, HxDN.  I, Jill Parcell, CMA, am acting as  scribe for Rexene Rattler, MD.    Documentation: I have reviewed the above documentation for accuracy and completeness, and I agree with the above.  Rexene Rattler, MD

## 2024-12-23 ENCOUNTER — Ambulatory Visit: Attending: Internal Medicine

## 2024-12-23 DIAGNOSIS — M6281 Muscle weakness (generalized): Secondary | ICD-10-CM | POA: Diagnosis present

## 2024-12-23 DIAGNOSIS — R262 Difficulty in walking, not elsewhere classified: Secondary | ICD-10-CM | POA: Insufficient documentation

## 2024-12-23 DIAGNOSIS — R2689 Other abnormalities of gait and mobility: Secondary | ICD-10-CM | POA: Diagnosis present

## 2024-12-23 NOTE — Progress Notes (Deleted)
 " OUTPATIENT PHYSICAL THERAPY NEURO EVALUATION   Patient Name: Carol Carey MRN: [id number] DOB:Nov 10, 1951, 74 y.o., female Today's Date: 12/23/2024   PCP: Auston Reyes BIRCH, MD  REFERRING PROVIDER: Auston Reyes BIRCH, MD   END OF SESSION:   Past Medical History:  Diagnosis Date   Aortic atherosclerosis    Arthritis    Ascending aorta dilatation    a.) measured 3.9cm by TTE on 06/29/21   Basal cell carcinoma 09/12/2023   Left inferior nasal ala, EDC 10/18/23   CAD (coronary artery disease)    Carotid artery stenosis    Chronic left-sided low back pain with left-sided sciatica    Colon polyp    COPD (chronic obstructive pulmonary disease) (HCC)    Degenerative joint disease    GERD (gastroesophageal reflux disease)    Hepatic steatosis    Hx of adenomatous colonic polyps    Hyperlipidemia    Hypertension    Hypothyroidism    Lymphadenitis, chronic    Melanoma in situ (HCC) 08/13/2021   L forearm   Osteoporosis    Post laminectomy syndrome    Pre-diabetes    Pulmonary emphysema (HCC)    Seasonal allergies    Skin cancer    lesion removed from nose, BCC, [facility]   Sleep apnea    not on nocturnal PAP therapy   Spinal stenosis    Stroke (HCC) 02/24/2023   mini stroke- no dificits   Valvular insufficiency    a.) TTE on 06/29/2021 --> moderate AR, trivial MR/TR, mild PR   Vitamin D insufficiency    Past Surgical History:  Procedure Laterality Date   ABDOMINAL EXPOSURE N/A 09/30/2020   Procedure: ABDOMINAL EXPOSURE;  Surgeon: Marea Selinda RAMAN, MD;  Location: [facility] OR[facility];  Service: Vascular;  Laterality: N/A;   ABDOMINAL HYSTERECTOMY  1993   ANTERIOR AND POSTERIOR SPINAL FUSION N/A 09/30/2020   Procedure: L5-S1 ANTERIOR LUMBAR INTERBODY FUSION, L3-S1 INSTRUMENTATION;  Surgeon: Clois Fret, MD;  Location: [facility];  Service: Neurosurgery;  Laterality: N/A;   ANTERIOR CERVICAL DECOMP/DISCECTOMY FUSION N/A 07/09/2021   Procedure: C4-5 ANTERIOR CERVICAL  DECOMPRESSION/DISCECTOMY FUSION 1 LEVEL;  Surgeon: Clois Fret, MD;  Location: [facility];  Service: Neurosurgery;  Laterality: N/A;  schedule as first case   APPENDECTOMY  1974   BACK SURGERY     BREAST BIOPSY Right 02/15/2021   Q clip, us  bx, 9:30 5cmfn Usual Ductal Hyperplasia, PASH   BREAST BIOPSY Left 02/15/2021   x clip, us  bx, 1:00 2cmfn, Usual Ductal Hyperplasia, Apocrine Metaplasia, PASH   BREAST EXCISIONAL BIOPSY Right 1985   Benign cycts removed   CATARACT EXTRACTION W/PHACO Left 11/23/2022   Procedure: CATARACT EXTRACTION PHACO AND INTRAOCULAR LENS PLACEMENT (IOC) LEFT MALYUGIN;  Surgeon: Mittie Gaskin, MD;  Location: [facility];  Service: Ophthalmology;  Laterality: Left;  11.46 1:16.1   CATARACT EXTRACTION W/PHACO Right 12/21/2022   Procedure: CATARACT EXTRACTION PHACO AND INTRAOCULAR LENS PLACEMENT (IOC) RIGHT MALYUGIN 13.45 01:24.5;  Surgeon: Mittie Gaskin, MD;  Location: [facility];  Service: Ophthalmology;  Laterality: Right;   CESAREAN SECTION  1993   COLONOSCOPY     COLONOSCOPY WITH PROPOFOL  N/A 12/12/2023   Procedure: COLONOSCOPY WITH PROPOFOL ;  Surgeon: Toledo, Ladell MARLA, MD;  Location: [facility];  Service: Gastroenterology;  Laterality: N/A;   EYE SURGERY     LUMBAR FUSION  2010   Dr. Gretel   LUMBAR FUSION  2013   x 2 Dr. Angelena   POLYPECTOMY  12/12/2023   Procedure:  POLYPECTOMY;  Surgeon: Aundria, Ladell POUR, MD;  Location: [facility];  Service: Gastroenterology;;   TONSILLECTOMY     TUBAL LIGATION  1974   UPPER GI ENDOSCOPY     Patient Active Problem List   Diagnosis Date Noted   Spondylolisthesis 09/30/2020   Chronic left-sided low back pain with left-sided sciatica 04/19/2020   Osteoporosis, post-menopausal 08/13/2019   Vitamin D insufficiency 08/13/2019   Hyperlipidemia 08/02/2019   Degenerative joint disease 08/02/2019   GERD (gastroesophageal reflux disease) 08/02/2019   Hypothyroid 08/02/2019   Osteoporosis  08/02/2019   Sleep apnea 08/02/2019   Tobacco use disorder 07/02/2019   Carotid stenosis 07/02/2019   Lymphadenitis, chronic 06/14/2019   Atherosclerosis of arteries 05/04/2018   Pulmonary emphysema (HCC) 05/04/2018   Hx of adenomatous colonic polyps 05/02/2018   Skin cancer 06/17/2016   Post laminectomy syndrome 04/24/2012   Essential (primary) hypertension 12/13/1950    ONSET DATE: ***  REFERRING DIAG: ***  THERAPY DIAG:  No diagnosis found.  Rationale for Evaluation and Treatment: {HABREHAB:27488}  SUBJECTIVE:                                                                                                                                                                                             SUBJECTIVE STATEMENT: *** Pt accompanied by: {accompnied:27141}  PERTINENT HISTORY: ***  PAIN:  Are you having pain? {OPRCPAIN:27236}  PRECAUTIONS: {Therapy precautions:24002}  RED FLAGS: {PT Red Flags:29287}   WEIGHT BEARING RESTRICTIONS: {Yes ***/No:24003}  FALLS: Has patient fallen in last 6 months? {fallsyesno:27318}  LIVING ENVIRONMENT: Lives with: {OPRC lives with:25569::lives with their family} Lives in: {Lives in:25570} Stairs: {opstairs:27293} Has following equipment at home: {Assistive devices:23999}  PLOF: {PLOF:24004}  PATIENT GOALS: ***  OBJECTIVE:  Note: Objective measures were completed at Evaluation unless otherwise noted.  DIAGNOSTIC FINDINGS: ***  COGNITION: Overall cognitive status: {cognition:24006}   SENSATION: {sensation:27233}  COORDINATION: ***  EDEMA:  {edema:24020}  MUSCLE TONE: {LE tone:25568}  MUSCLE LENGTH: Hamstrings: Right *** deg; Left *** deg Debby test: Right *** deg; Left *** deg  DTRs:  {DTR SITE:24025}  POSTURE: {posture:25561}  LOWER EXTREMITY ROM:     {AROM/PROM:27142}  Right Eval Left Eval  Hip flexion    Hip extension    Hip abduction    Hip adduction    Hip internal rotation    Hip external  rotation    Knee flexion    Knee extension    Ankle dorsiflexion    Ankle plantarflexion    Ankle inversion    Ankle eversion     (Blank rows = not tested)  LOWER EXTREMITY MMT:  MMT Right Eval Left Eval  Hip flexion    Hip extension    Hip abduction    Hip adduction    Hip internal rotation    Hip external rotation    Knee flexion    Knee extension    Ankle dorsiflexion    Ankle plantarflexion    Ankle inversion    Ankle eversion    (Blank rows = not tested)  BED MOBILITY:  {bed mobility:32615:p}  TRANSFERS: {transfers eval:32620}  RAMP:  {ramp eval:32616}  CURB:  {curb eval:32617}  STAIRS: {stairs eval:32618} GAIT: Findings: {GaitneuroPT:32644::Distance walked: ***,Comments: ***}  FUNCTIONAL TESTS:  {Functional tests:24029}  PATIENT SURVEYS:  {rehab surveys:24030}                                                                                                                              TREATMENT DATE: ***    PATIENT EDUCATION: Education details: *** Person educated: {Person educated:25204} Education method: {Education Method:25205} Education comprehension: {Education Comprehension:25206}  HOME EXERCISE PROGRAM: ***  GOALS: Goals reviewed with patient? {yes/no:20286}  SHORT TERM GOALS: Target date: ***  *** Baseline: Goal status: INITIAL  2.  *** Baseline:  Goal status: INITIAL  3.  *** Baseline:  Goal status: INITIAL  4.  *** Baseline:  Goal status: INITIAL  5.  *** Baseline:  Goal status: INITIAL  6.  *** Baseline:  Goal status: INITIAL  LONG TERM GOALS: Target date: ***  *** Baseline:  Goal status: INITIAL  2.  *** Baseline:  Goal status: INITIAL  3.  *** Baseline:  Goal status: INITIAL  4.  *** Baseline:  Goal status: INITIAL  5.  *** Baseline:  Goal status: INITIAL  6.  *** Baseline:  Goal status: INITIAL  ASSESSMENT:  CLINICAL IMPRESSION: Patient is a *** y.o. *** who was seen today  for physical therapy evaluation and treatment for ***.   OBJECTIVE IMPAIRMENTS: {opptimpairments:25111}.   ACTIVITY LIMITATIONS: {activitylimitations:27494}  PARTICIPATION LIMITATIONS: {participationrestrictions:25113}  PERSONAL FACTORS: {Personal factors:25162} are also affecting patient's functional outcome.   REHAB POTENTIAL: {rehabpotential:25112}  CLINICAL DECISION MAKING: {clinical decision making:25114}  EVALUATION COMPLEXITY: {Evaluation complexity:25115}  PLAN:  PT FREQUENCY: {rehab frequency:25116}  PT DURATION: {rehab duration:25117}  PLANNED INTERVENTIONS: {rehab planned interventions:25118::97110-Therapeutic exercises,97530- Therapeutic [phone]- Neuromuscular re-education,97535- Self Rjmz,02859- Manual therapy,Patient/Family education}  PLAN FOR NEXT SESSION: ***   Fonda Simpers, PT, DPT Physical Therapist - [facility]  12/23/24, 12:30 PM    "

## 2024-12-23 NOTE — Therapy (Signed)
 " OUTPATIENT PHYSICAL THERAPY NEURO EVALUATION   Patient Name: Carol Carey MRN: [id number] DOB:1951-08-18, 74 y.o., female Today's Date: 12/23/2024   PCP: Auston Reyes BIRCH, MD  REFERRING PROVIDER: Auston Reyes BIRCH, MD   END OF SESSION:  PT End of Session - 12/23/24 1352     Visit Number 1    Number of Visits 24    Date for Recertification  03/17/25    PT Start Time 1345    PT Stop Time 1430    PT Time Calculation (min) 45 min    Equipment Utilized During Treatment Gait belt    Activity Tolerance Patient tolerated treatment well          Past Medical History:  Diagnosis Date   Aortic atherosclerosis    Arthritis    Ascending aorta dilatation    a.) measured 3.9cm by TTE on 06/29/21   Basal cell carcinoma 09/12/2023   Left inferior nasal ala, EDC 10/18/23   CAD (coronary artery disease)    Carotid artery stenosis    Chronic left-sided low back pain with left-sided sciatica    Colon polyp    COPD (chronic obstructive pulmonary disease) (HCC)    Degenerative joint disease    GERD (gastroesophageal reflux disease)    Hepatic steatosis    Hx of adenomatous colonic polyps    Hyperlipidemia    Hypertension    Hypothyroidism    Lymphadenitis, chronic    Melanoma in situ (HCC) 08/13/2021   L forearm   Osteoporosis    Post laminectomy syndrome    Pre-diabetes    Pulmonary emphysema (HCC)    Seasonal allergies    Skin cancer    lesion removed from nose, BCC, Mohs at [facility]   Sleep apnea    not on nocturnal PAP therapy   Spinal stenosis    Stroke (HCC) 02/24/2023   mini stroke- no dificits   Valvular insufficiency    a.) TTE on 06/29/2021 --> moderate AR, trivial MR/TR, mild PR   Vitamin D insufficiency    Past Surgical History:  Procedure Laterality Date   ABDOMINAL EXPOSURE N/A 09/30/2020   Procedure: ABDOMINAL EXPOSURE;  Surgeon: Marea Selinda RAMAN, MD;  Location: [facility];  Service: Vascular;  Laterality: N/A;   ABDOMINAL HYSTERECTOMY  1993   ANTERIOR AND  POSTERIOR SPINAL FUSION N/A 09/30/2020   Procedure: L5-S1 ANTERIOR LUMBAR INTERBODY FUSION, L3-S1 INSTRUMENTATION;  Surgeon: Clois Fret, MD;  Location: [facility];  Service: Neurosurgery;  Laterality: N/A;   ANTERIOR CERVICAL DECOMP/DISCECTOMY FUSION N/A 07/09/2021   Procedure: C4-5 ANTERIOR CERVICAL DECOMPRESSION/DISCECTOMY FUSION 1 LEVEL;  Surgeon: Clois Fret, MD;  Location: [facility];  Service: Neurosurgery;  Laterality: N/A;  schedule as first case   APPENDECTOMY  1974   BACK SURGERY     BREAST BIOPSY Right 02/15/2021   Q clip, us  bx, 9:30 5cmfn Usual Ductal Hyperplasia, PASH   BREAST BIOPSY Left 02/15/2021   x clip, us  bx, 1:00 2cmfn, Usual Ductal Hyperplasia, Apocrine Metaplasia, PASH   BREAST EXCISIONAL BIOPSY Right 1985   Benign cycts removed   CATARACT EXTRACTION W/PHACO Left 11/23/2022   Procedure: CATARACT EXTRACTION PHACO AND INTRAOCULAR LENS PLACEMENT (IOC) LEFT MALYUGIN;  Surgeon: Mittie Gaskin, MD;  Location: [facility];  Service: Ophthalmology;  Laterality: Left;  11.46 1:16.1   CATARACT EXTRACTION W/PHACO Right 12/21/2022   Procedure: CATARACT EXTRACTION PHACO AND INTRAOCULAR LENS PLACEMENT (IOC) RIGHT MALYUGIN 13.45 01:24.5;  Surgeon: Mittie Gaskin, MD;  Location: [facility];  Service: Ophthalmology;  Laterality: Right;   CESAREAN SECTION  1993   COLONOSCOPY     COLONOSCOPY WITH PROPOFOL  N/A 12/12/2023   Procedure: COLONOSCOPY WITH PROPOFOL ;  Surgeon: Toledo, Ladell POUR, MD;  Location: [facility];  Service: Gastroenterology;  Laterality: N/A;   EYE SURGERY     LUMBAR FUSION  2010   Dr. Gretel   LUMBAR FUSION  2013   x 2 Dr. Angelena   POLYPECTOMY  12/12/2023   Procedure: POLYPECTOMY;  Surgeon: Aundria, Ladell POUR, MD;  Location: [facility];  Service: Gastroenterology;;   TONSILLECTOMY     TUBAL LIGATION  1974   UPPER GI ENDOSCOPY     Patient Active Problem List   Diagnosis Date Noted   Spondylolisthesis 09/30/2020    Chronic left-sided low back pain with left-sided sciatica 04/19/2020   Osteoporosis, post-menopausal 08/13/2019   Vitamin D insufficiency 08/13/2019   Hyperlipidemia 08/02/2019   Degenerative joint disease 08/02/2019   GERD (gastroesophageal reflux disease) 08/02/2019   Hypothyroid 08/02/2019   Osteoporosis 08/02/2019   Sleep apnea 08/02/2019   Tobacco use disorder 07/02/2019   Carotid stenosis 07/02/2019   Lymphadenitis, chronic 06/14/2019   Atherosclerosis of arteries 05/04/2018   Pulmonary emphysema (HCC) 05/04/2018   Hx of adenomatous colonic polyps 05/02/2018   Skin cancer 06/17/2016   Post laminectomy syndrome 04/24/2012   Essential (primary) hypertension May 06, 1951    ONSET DATE: 12/05/2022  REFERRING DIAG: R27.0 (ICD-10-CM) - Ataxia   THERAPY DIAG:  Imbalance  Difficulty in walking, not elsewhere classified  Muscle weakness (generalized)  Rationale for Evaluation and Treatment: Rehabilitation  SUBJECTIVE:                                                                                                                                                                                             SUBJECTIVE STATEMENT:  Pt is wanting to be seen by therapy to improve her balance.    Pt accompanied by: self  PERTINENT HISTORY:   Pt reports that she has been having balance deficits since her ALIF back in 2021.  Pt notes that she has had 4 back surgeries in total, with the first being back in 2010.  Pt notes that she had been treated for her balance in the past at the [facility] location, but had to stop because her son was needing assistance and she was sitting with him every other day.  Pt notes that she had a light stroke 2 years ago.  Pt notes that it effected her ability to drive.  PAIN:  Are you having pain? No  PRECAUTIONS: None  RED FLAGS: None   WEIGHT BEARING RESTRICTIONS: No  FALLS: Has patient fallen in last 6 months? No  LIVING ENVIRONMENT: Lives  with: lives with their spouse Lives in: House/apartment Stairs: Yes: Internal: 15 steps; on left going up and External: 5 steps; on right going up Has following equipment at home: Single point cane, Walker - 2 wheeled, shower chair, and Grab bars  PLOF: Independent  PATIENT GOALS: To improve balance and be able to move without the feeling of falling all of the time.  OBJECTIVE:  Note: Objective measures were completed at Evaluation unless otherwise noted.  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  COORDINATION: Pt has coordination within normal limits.  POSTURE: rounded shoulders, forward head, decreased lumbar lordosis, and increased thoracic kyphosis  LOWER EXTREMITY ROM:     Passive  Right Eval Left Eval  Hip flexion    Hip extension    Hip abduction    Hip adduction    Hip internal rotation    Hip external rotation    Knee flexion    Knee extension    Ankle dorsiflexion    Ankle plantarflexion    Ankle inversion    Ankle eversion     (Blank rows = not tested)  LOWER EXTREMITY MMT:    MMT Right Eval Left Eval  Hip flexion 4- 4-  Hip extension    Hip abduction 4- 4-  Hip adduction 4- 4-  Hip internal rotation    Hip external rotation    Knee flexion 4 4  Knee extension 4- 4-  Ankle dorsiflexion 4- 4+  Ankle plantarflexion    Ankle inversion    Ankle eversion    (Blank rows = not tested)  STAIRS: Not tested  FUNCTIONAL TESTS:  5 times sit to stand: 21.64 sec Timed up and go (TUG): 17.83 sec 6 minute walk test: TBD at subsequent visit 10 meter walk test: 21.05 sec  PATIENT SURVEYS:  ABC scale: The Activities-Specific Balance Confidence (ABC) Scale 0% 10 20 30  40 50 60 70 80 90 100% No confidence<->completely confident  How confident are you that you will not lose your balance or become unsteady when you . . .   Date tested  12/23/2024  Walk around the house 100%  2. Walk up or down stairs 50%  3. Bend over and pick up a slipper from in front of a closet floor 70%  4. Reach for a small can off a shelf at eye level 90%  5. Stand on tip toes and reach for something above your head 90%  6. Stand on a chair and reach for something 40%  7. Sweep the floor 90%  8. Walk outside the house to a car parked in the driveway 80%  9. Get into or out of a car 80%  10. Walk across a parking lot to the mall 70%  11. Walk up or down a ramp 60%  12. Walk in a crowded mall where people rapidly walk past you 70%  13. Are bumped into by people as you walk through the mall 80%  14. Step onto or off of an escalator while you are holding onto the railing 70%  15. Step onto or off an escalator while holding onto parcels such that you cannot hold onto the railing 60%  16. Walk outside on icy sidewalks 20%  Total: 16/16 1120/1600 = 70%  TREATMENT DATE: 12/23/2024  Evaluation   Self-Care/Home Management:  Pt given education on current POC, the findings of the evaluation, and the ways in which skilled therapy can address the current deficits in balance and musculature strength.  Pt instructed on how this can improve their overall function and maintain/improve their overall quality of life.     PATIENT EDUCATION: Education details: Pt educated on role of PT and services provided during current POC, along with prognosis and information about the clinic.  Person educated: Patient Education method: Explanation Education comprehension: verbalized understanding  HOME EXERCISE PROGRAM:  To be given at the subsequent session  GOALS: Goals reviewed with patient? Yes  SHORT TERM GOALS: Target date: 01/20/2025  Pt will be independent with HEP in order to demonstrate increased ability to perform tasks related  to occupation/hobbies. Baseline:   Goal status: INITIAL   LONG TERM GOALS: Target date: 03/17/2025  1.  Patient (> 15 years old) will complete five times sit to stand test in < 15 seconds indicating an increased LE strength and improved balance. Baseline: 21.64 sec Goal status: INITIAL  2.  Patient will increase Berg Balance score by > 6 points to demonstrate decreased fall risk during functional activities. Baseline: TBD at the next visit Goal status: INITIAL   3.  Patient will reduce timed up and go to <11 seconds to reduce fall risk and demonstrate improved transfer/gait ability. Baseline: 17.83 Goal status: INITIAL  4.  Patient will increase 10 meter walk test to >1.56m/s as to improve gait speed for better community ambulation and to reduce fall risk. Baseline: 21.05 Goal status: INITIAL  5.  Patient will increase six minute walk test distance to >1000 for progression to community ambulator and improve gait ability Baseline: TBD at next visit Goal status: INITIAL  6.  Pt will improve ABC by at least 13% in order to demonstrate clinically significant improvement in balance confidence.  Baseline: 70% Goal status: INITIAL    ASSESSMENT:  CLINICAL IMPRESSION:  Patient is a 74 y.o. female who was seen today for physical therapy evaluation and treatment for imbalance.  Pt currently demonstrates increased fall risk based on the current tests and measurements at this time.  Pt also demonstrated weakness in the LE's that are contributing to her instability when walking.  Pt will benefit from skilled therapy to address activity tolerance, imbalance, and strength impairments necessary for improvement in quality of life, while also reducing risk of falling.  Pt. demonstrates understanding of this plan of care and agrees with this plan.    OBJECTIVE IMPAIRMENTS: Abnormal gait, decreased activity tolerance, decreased balance, decreased endurance, decreased mobility, difficulty walking,  decreased strength, and dizziness.   ACTIVITY LIMITATIONS: carrying, lifting, bending, sitting, standing, squatting, and locomotion level  PARTICIPATION LIMITATIONS: meal prep, cleaning, laundry, driving, shopping, community activity, and yard work  PERSONAL FACTORS: Age, Education, Fitness, Past/current experiences, Time since onset of injury/illness/exacerbation, and 3+ comorbidities: arthritis, CAD, chronic low back pain, COPD, DJD, HTN, Osteoporosis, Sleep apnes, spinal stenosis, and stroke are also affecting patient's functional outcome.   REHAB POTENTIAL: Good  CLINICAL DECISION MAKING: Evolving/moderate complexity  EVALUATION COMPLEXITY: Moderate  PLAN:  PT FREQUENCY: 1-2x/week  PT DURATION: 8 weeks  PLANNED INTERVENTIONS: 97750- Physical Performance Testing, 97110-Therapeutic exercises, 97530- Therapeutic activity, [id number]- Neuromuscular re-education, 97535- Self Care, [id number]- Manual therapy, [phone]- Gait training, Patient/Family education, Balance training, Stair training, Vestibular training, and Visual/preceptual remediation/compensation  PLAN FOR NEXT SESSION:   6 minute walk test, BERG balance test, generated  HEP and have pt perform   Fonda Simpers, PT, DPT Physical Therapist - [facility]  12/23/24, 2:42 PM   "

## 2024-12-25 ENCOUNTER — Ambulatory Visit

## 2025-01-07 ENCOUNTER — Ambulatory Visit

## 2025-01-13 ENCOUNTER — Ambulatory Visit

## 2025-01-15 ENCOUNTER — Ambulatory Visit

## 2025-01-21 ENCOUNTER — Ambulatory Visit

## 2025-01-23 ENCOUNTER — Ambulatory Visit

## 2025-01-28 ENCOUNTER — Ambulatory Visit

## 2025-01-30 ENCOUNTER — Ambulatory Visit

## 2025-02-04 ENCOUNTER — Ambulatory Visit

## 2025-02-06 ENCOUNTER — Ambulatory Visit

## 2025-02-10 ENCOUNTER — Ambulatory Visit

## 2025-02-11 ENCOUNTER — Ambulatory Visit (INDEPENDENT_AMBULATORY_CARE_PROVIDER_SITE_OTHER): Admitting: Vascular Surgery

## 2025-02-11 ENCOUNTER — Encounter (INDEPENDENT_AMBULATORY_CARE_PROVIDER_SITE_OTHER)

## 2025-02-12 ENCOUNTER — Ambulatory Visit

## 2025-02-17 ENCOUNTER — Ambulatory Visit

## 2025-02-19 ENCOUNTER — Ambulatory Visit

## 2025-02-25 ENCOUNTER — Ambulatory Visit

## 2025-02-27 ENCOUNTER — Ambulatory Visit

## 2025-05-27 ENCOUNTER — Encounter: Admitting: Dermatology
